# Patient Record
Sex: Female | Born: 1955 | Race: White | Hispanic: Yes | Marital: Single | Smoking: Never smoker
Health system: Southern US, Community
[De-identification: ages and names within clinical notes are randomized; demographics above are authoritative.]

## PROBLEM LIST (undated history)

## (undated) DIAGNOSIS — E785 Hyperlipidemia, unspecified: Secondary | ICD-10-CM

## (undated) DIAGNOSIS — F329 Major depressive disorder, single episode, unspecified: Secondary | ICD-10-CM

## (undated) DIAGNOSIS — D649 Anemia, unspecified: Secondary | ICD-10-CM

## (undated) DIAGNOSIS — D62 Acute posthemorrhagic anemia: Secondary | ICD-10-CM

## (undated) DIAGNOSIS — I10 Essential (primary) hypertension: Secondary | ICD-10-CM

## (undated) DIAGNOSIS — F32A Depression, unspecified: Secondary | ICD-10-CM

## (undated) DIAGNOSIS — M069 Rheumatoid arthritis, unspecified: Secondary | ICD-10-CM

## (undated) DIAGNOSIS — D509 Iron deficiency anemia, unspecified: Secondary | ICD-10-CM

## (undated) DIAGNOSIS — H269 Unspecified cataract: Secondary | ICD-10-CM

## (undated) DIAGNOSIS — R55 Syncope and collapse: Secondary | ICD-10-CM

## (undated) HISTORY — DX: Major depressive disorder, single episode, unspecified: F32.9

## (undated) HISTORY — DX: Depression, unspecified: F32.A

## (undated) HISTORY — PX: KNEE SURGERY: SHX244

## (undated) HISTORY — DX: Unspecified cataract: H26.9

## (undated) HISTORY — DX: Essential (primary) hypertension: I10

## (undated) HISTORY — DX: Anemia, unspecified: D64.9

## (undated) HISTORY — DX: Rheumatoid arthritis, unspecified: M06.9

## (undated) HISTORY — DX: Hyperlipidemia, unspecified: E78.5

---

## 2013-05-22 HISTORY — PX: VARICOSE VEIN SURGERY: SHX832

## 2014-02-19 LAB — HM MAMMOGRAPHY

## 2014-05-08 ENCOUNTER — Ambulatory Visit: Payer: Self-pay | Attending: Internal Medicine

## 2014-05-22 HISTORY — PX: FOOT SURGERY: SHX648

## 2014-06-12 ENCOUNTER — Ambulatory Visit: Payer: Self-pay

## 2014-06-19 ENCOUNTER — Ambulatory Visit: Payer: Self-pay | Attending: Internal Medicine

## 2014-07-13 ENCOUNTER — Ambulatory Visit: Payer: Self-pay | Admitting: Internal Medicine

## 2014-07-16 ENCOUNTER — Ambulatory Visit: Payer: Self-pay | Attending: Internal Medicine | Admitting: Internal Medicine

## 2014-07-16 ENCOUNTER — Encounter: Payer: Self-pay | Admitting: Internal Medicine

## 2014-07-16 VITALS — BP 118/79 | HR 52 | Temp 98.4°F | Resp 16 | Ht 60.0 in | Wt 170.0 lb

## 2014-07-16 DIAGNOSIS — H7291 Unspecified perforation of tympanic membrane, right ear: Secondary | ICD-10-CM

## 2014-07-16 DIAGNOSIS — M069 Rheumatoid arthritis, unspecified: Secondary | ICD-10-CM

## 2014-07-16 DIAGNOSIS — I10 Essential (primary) hypertension: Secondary | ICD-10-CM

## 2014-07-16 DIAGNOSIS — M270 Developmental disorders of jaws: Secondary | ICD-10-CM

## 2014-07-16 MED ORDER — BISOPROLOL-HYDROCHLOROTHIAZIDE 5-6.25 MG PO TABS: 1.0000 | ORAL_TABLET | Freq: Every day | ORAL | Status: DC

## 2014-07-16 NOTE — Patient Instructions (Addendum)

## 2014-07-16 NOTE — Progress Notes (Signed)
Pt is here to establish care. Pt has a history of arthritis.  Pt wants to have a check up and wants to check her cholesterol.

## 2014-07-16 NOTE — Progress Notes (Signed)
Patient ID: Rachel Austin, female   DOB: 1956/01/17, 59 y.o.   MRN: [id number]  [id number]  [id number]  DOB - 06-08-1955  CC:  Chief Complaint  Patient presents with  . Establish Care       HPI: Rachel Austin is a 59 y.o. Hispanic female here today to establish medical care. She has a past medical history of Rheumatoid Arthritis and hypertension. She states that she had a rheumatologist in [place] but has been unable to find a provider since moving here to [place].  Pap/mammogram---last October and both were normal.  Right perforated ear drum 20 years ago Hard palate swollen-cyst? Has not had it evaluated before.  Patient has No headache, No chest pain, No abdominal pain - No Nausea, No new weakness tingling or numbness, No Cough - SOB.  No Known Allergies Past Medical History  Diagnosis Date  . Arthritis    No current outpatient prescriptions on file prior to visit.   No current facility-administered medications on file prior to visit.   Family History  Problem Relation Age of Onset  . Heart disease Mother    History   Social History  . Marital Status: Single    Spouse Name: N/A  . Number of Children: N/A  . Years of Education: N/A   Occupational History  . Not on file.   Social History Main Topics  . Smoking status: Never Smoker   . Smokeless tobacco: Not on file  . Alcohol Use: No  . Drug Use: No  . Sexual Activity: No   Other Topics Concern  . Not on file   Social History Narrative  . No narrative on file    Review of Systems  Eyes: Negative.   Musculoskeletal: Positive for joint pain.  All other systems reviewed and are negative.     Objective:   Filed Vitals:   07/16/14 1407  BP: 118/79  Pulse: 52  Temp: 98.4 F (36.9 C)  Resp: 16    Physical Exam  Constitutional: She is oriented to person, place, and time.  HENT:  Left Ear: External ear normal.  Mouth/Throat: No oropharyngeal exudate.    Right perforated eardrum    Cardiovascular: Normal rate, regular rhythm and normal heart sounds.   Pulmonary/Chest: Effort normal and breath sounds normal.  Musculoskeletal: She exhibits no edema or tenderness.  Neurological: She is alert and oriented to person, place, and time.  Skin: Skin is warm and dry.     No results found for: WBC, HGB, HCT, MCV, PLT No results found for: CREATININE, BUN, NA, K, CL, CO2  No results found for: HGBA1C Lipid Panel  No results found for: CHOL, TRIG, HDL, CHOLHDL, VLDL, LDLCALC     Assessment and plan:   [name] was seen today for establish care.  Diagnoses and all orders for this visit:  Rheumatoid arthritis Orders: -     Ambulatory referral to Rheumatology--to continue with adalimumab -     Lipid panel; Future -     COMPLETE METABOLIC PANEL WITH GFR; Future -     CBC with Differential; Future -     TSH; Future -     HIV antibody (with reflex); Future  Essential hypertension Orders: -    refill bisoprolol-hydrochlorothiazide (ZIAC) 5-6.25 MG per tablet; Take 1 tablet by mouth daily.  Perforated ear drum, right Proper precautions discussed. 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 .   Due to language barrier, an interpreter was present during the history-taking and subsequent discussion (and for part of the physical exam) with this patient.   Chari Manning, NP-C [facility] [phone] 07/16/2014, 2:33 PM

## 2014-07-23 ENCOUNTER — Ambulatory Visit: Payer: Self-pay | Attending: Internal Medicine

## 2014-07-23 DIAGNOSIS — M069 Rheumatoid arthritis, unspecified: Secondary | ICD-10-CM

## 2014-07-23 LAB — LIPID PANEL
CHOLESTEROL: 205 mg/dL — AB (ref 0–200)
HDL: 55 mg/dL (ref >=46)
LDL Cholesterol: 124 mg/dL — ABNORMAL HIGH (ref 0–99)
TRIGLYCERIDES: 130 mg/dL (ref <150)
Total CHOL/HDL Ratio: 3.7 Ratio
VLDL: 26 mg/dL (ref 0–40)

## 2014-07-28 ENCOUNTER — Telehealth: Payer: Self-pay | Admitting: *Deleted

## 2014-07-28 NOTE — Telephone Encounter (Signed)
-----   Message from Lance Bosch, NP sent at 07/24/2014 10:15 AM EST ----- Cholesterol is elavated. It may help to add patient on low dose statin. Please send atorvastatin 10 mg PO every evening. Go over diet and exercise

## 2014-07-28 NOTE — Telephone Encounter (Signed)
Left message to return my call.  

## 2014-08-10 ENCOUNTER — Ambulatory Visit: Payer: Self-pay | Attending: Internal Medicine

## 2014-08-28 ENCOUNTER — Telehealth: Payer: Self-pay

## 2014-08-28 NOTE — Telephone Encounter (Signed)
Received call from patient indicating she received a letter from Rachel Austin regarding her ENT and Rheumatology referrals. Patient requests call back from Rachel Austin to discuss referrals.  Message routed to [employer].

## 2014-09-10 NOTE — Telephone Encounter (Signed)
Mailed a letter to patient on 08-19-14   Pt has the Chiropractor program and they don't have ENT in the network Mailed a letter to patient with the option to go to [facility] $100 or [facility] $ 65.00 and apply for financial with them Waiting for patient response.  Rheumatology  Pt don't have insurance I send her a letter with the options to go to [facility] Rheumatology  $65.00 and apply for financial assistant or go to [address] ph. # [phone] and pay $ 110.  Waiting for patient answer.

## 2014-11-11 ENCOUNTER — Encounter: Payer: Self-pay | Admitting: *Deleted

## 2014-12-30 ENCOUNTER — Encounter: Payer: Self-pay | Admitting: Adult Health

## 2014-12-30 ENCOUNTER — Ambulatory Visit (INDEPENDENT_AMBULATORY_CARE_PROVIDER_SITE_OTHER): Payer: Medicare Other | Admitting: Adult Health

## 2014-12-30 VITALS — BP 110/72 | Temp 99.7°F | Ht 60.0 in | Wt 158.9 lb

## 2014-12-30 DIAGNOSIS — Z7189 Other specified counseling: Secondary | ICD-10-CM

## 2014-12-30 DIAGNOSIS — Z7689 Persons encountering health services in other specified circumstances: Secondary | ICD-10-CM

## 2014-12-30 DIAGNOSIS — I10 Essential (primary) hypertension: Secondary | ICD-10-CM

## 2014-12-30 DIAGNOSIS — H65191 Other acute nonsuppurative otitis media, right ear: Secondary | ICD-10-CM

## 2014-12-30 DIAGNOSIS — F329 Major depressive disorder, single episode, unspecified: Secondary | ICD-10-CM

## 2014-12-30 DIAGNOSIS — Z23 Encounter for immunization: Secondary | ICD-10-CM | POA: Diagnosis not present

## 2014-12-30 DIAGNOSIS — M069 Rheumatoid arthritis, unspecified: Secondary | ICD-10-CM

## 2014-12-30 DIAGNOSIS — F32A Depression, unspecified: Secondary | ICD-10-CM

## 2014-12-30 MED ORDER — BISOPROLOL-HYDROCHLOROTHIAZIDE 5-6.25 MG PO TABS: 1.0000 | ORAL_TABLET | Freq: Every day | ORAL | Status: DC

## 2014-12-30 MED ORDER — CIPROFLOXACIN-DEXAMETHASONE 0.3-0.1 % OT SUSP: OTIC | Status: DC

## 2014-12-30 MED ORDER — CITALOPRAM HYDROBROMIDE 20 MG PO TABS: 20.0000 mg | ORAL_TABLET | Freq: Every day | ORAL | Status: DC

## 2014-12-30 NOTE — Progress Notes (Signed)
HPI:  Rachel Austin is here to establish care. She is a very pleasant spanish speaking female. She presents with spanish interpreter. She   has a past medical history of Rheumatoid arthritis; Hyperlipidemia; Hypertension; and Depression.  Last PCP and physical:October 2015.  Immunizations:Needs Tdap Diet:Vegetables, chicken, fruit.  Exercise:No Colonoscopy:2008 Pap Smear:October 2015, normal Mammogram:02/2014  Has the following chronic problems that require follow up and concerns today:  Right Ear Pain Complains of pain in her right ear. She endorses that she has had pain in the right ear for a week. She endorses drainage. No tinnitus. She has chronic ear infection.   RA - Needs to establish care with Rheumatology in [place].   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 Since that time she has suffered from periodic episodes of depression. She is currently depressed due to moving from [place] to be with her daughter, her daughter works and she does not know anyone here and does not know the area. She denies any SI or HI. Is able to spend time with new granddaughter which helps make her happy.    ROS negative for unless reported above: fevers, chills,feeling poorly, unintentional weight loss, hearing or vision loss, chest pain, palpitations, leg claudication, struggling to breath,Not feeling congested in the chest, no orthopenia, no cough,no wheezing, normal appetite, no soft tissue swelling, no hemoptysis, melena, hematochezia, hematuria, falls, loc, si, or thoughts of self harm.    Past Medical History  Diagnosis Date  . Arthritis     Past Surgical History  Procedure Laterality Date  . Knee surgery Bilateral     Family History  Problem Relation Age of Onset  . Heart disease Mother     Social History   Social History  . Marital Status: Single    Spouse Name:  N/A  . Number of Children: N/A  . Years of Education: N/A   Social History Main Topics  . Smoking status: Never Smoker   . Smokeless tobacco: None  . Alcohol Use: No  . Drug Use: No  . Sexual Activity: No   Other Topics Concern  . None   Social History Narrative     Current outpatient prescriptions:  .  Adalimumab 40 MG/0.8ML PSKT, Inject into the skin. Inject 1 dose subq every 2 weeks or as directed., Disp: , Rfl:  .  bisoprolol-hydrochlorothiazide (ZIAC) 5-6.25 MG per tablet, Take 1 tablet by mouth daily., Disp: 30 tablet, Rfl: 4 .  CALCIUM PO, Take by mouth., Disp: , Rfl:  .  methotrexate (RHEUMATREX) 2.5 MG tablet, Take 2.5 mg by mouth once a week. Tale 6 pills by mouth every week.   Caution:Chemotherapy. Protect from light., Disp: , Rfl:  .  Multiple Vitamins-Minerals (CENTRUM SILVER ULTRA WOMENS PO), Take by mouth., Disp: , Rfl:   EXAM:  Filed Vitals:   12/30/14 1422  BP: 110/72  Temp: 99.7 F (37.6 C)    Body mass index is 31.03 kg/(m^2).  GENERAL: vitals reviewed and listed above, alert, oriented, appears well hydrated and in no acute distress  HEENT: atraumatic, conjunttiva clear, no obvious abnormalities on inspection of external nose and ears. Otitis externa in right ear.   NECK: Neck is soft and supple without masses, no adenopathy or thyromegaly, trachea midline, no JVD. Normal range of motion.   LUNGS: clear to auscultation bilaterally, no wheezes, rales or rhonchi, good air movement  CV: Regular rate and rhythm, normal S1/S2, no audible murmurs, gallops, or rubs. No carotid bruit and no peripheral edema.   MS: moves all extremities without noticeable abnormality. Trace edema in right ankle. Has significant joint damage in bilateral hands.   Abd: soft/nontender/nondistended/normal bowel sounds   Skin: warm and dry, no rash   Extremities: No clubbing, cyanosis, or edema. Capillary refill is WNL. Pulses intact bilaterally in upper and lower  extremities.   Neuro: CN II-XII intact, sensation and reflexes normal throughout, 5/5 muscle strength in bilateral upper and lower extremities. Normal finger to nose. Normal rapid alternating movements.   PSYCH: pleasant and cooperative, no obvious depression or anxiety  ASSESSMENT AND PLAN:  1. Encounter to establish care - Follow up in October for CPE - Follow up sooner if needed - Work on low sodium diet  2. Essential hypertension - bisoprolol-hydrochlorothiazide (ZIAC) 5-6.25 MG per tablet; Take 1 tablet by mouth daily.  Dispense: 30 tablet; Refill: 4  3. Acute nonsuppurative otitis media of right ear - ciprofloxacin-dexamethasone (CIPRODEX) otic suspension; Apply 4 drops to the affected ear(s) twice daily x 7 days  Dispense: 7.5 mL; Refill: 0  4. Rheumatoid arthritis - Ambulatory referral to Rheumatology  5. Depression - citalopram (CELEXA) 20 MG tablet; Take 1 tablet (20 mg total) by mouth daily.  Dispense: 30 tablet; Refill: 3 - Will reassess during CPE - Instructed to go to the ER with any thoughts of SI or HI - Do think that she will do much better when she meets people. Encouraged to go to church to meet people.     -We reviewed the PMH, PSH, FH, SH, Meds and Allergies. -We provided refills for any medications we will prescribe as needed. -We addressed current concerns per orders and patient instructions. -We have asked for records for pertinent exams, studies, vaccines and notes from previous providers. -We have advised patient to follow up per instructions below.   -Patient advised to return or notify a provider immediately if symptoms worsen or persist or new concerns arise.  There are no Patient Instructions on file for this visit.   [employer]

## 2014-12-30 NOTE — Patient Instructions (Addendum)
It was great meeting you today!  Please take the medications as prescribed.   Follow up in October for your physical.   If you need anything in the meantime, please let me know. [place] (Health Maintenance) Adoptar un estilo de vida saludable y recibir atencin preventiva pueden ser de suma utilidad para promover la salud y Musician. Hable con el mdico para saber cul es el esquema de exmenes peridicos adecuado para usted. Esta es una buena oportunidad para Teacher, adult education peridicamente al mdico sobre cmo prevenir enfermedades y Iola sano. Entre cada control mdico, hay muchas cosas que puede hacer por s solo. Los expertos han investigado mucho acerca de los cambios en el estilo de vida y las medidas preventivas que muy probablemente preserven su salud. Consulte al mdico para obtener ms informacin. EL PESO Y LA DIETA  Consuma una dieta saludable.  Incluya abundante cantidad de verduras, frutas, productos lcteos descremados y protenas magras.  No coma muchos alimentos con alto contenido de grasas slidas, azcares agregados o sal.  Realice actividad fsica con regularidad. Esta es una de las cosas ms importantes que puede hacer por su salud.  La [employer] de las personas adultas deben hacer actividad fsica durante por lo menos 128minutos semanales. El ejercicio debe aumentar la frecuencia cardaca y Nature conservation officer sudar (ejercicio de intensidad moderada).  Adems, casi todos los adultos deben hacer ejercicios de fortalecimiento al [employer] veces por semana como complemento del ejercicio de [place]. Mantenga un peso saludable.  El ndice de masa corporal [facility]) es una medida que puede usarse para identificar posibles problemas relacionados con el peso. Este ofrece un clculo estimativo de la Air traffic controller en funcin del peso y Agricultural consultant. El mdico puede determinar su [facility] y ayudarlo a Science writer y Theatre manager un peso saludable.  Para las mujeres mayores de  20aos:  Un [facility] menor de 18,5 se considera bajo peso.  Un [facility] entre 18,5 y 24,9 es normal.  Un [facility] entre 25 y 29,9 es sobrepeso.  Un IMC de 30 o ms se considera obesidad. Controlar los niveles de colesterol y lpidos en la sangre  Debe comenzar a hacerse anlisis de sangre para controlar los Everetts de lpidos y colesterol a partir de [place], y repetir estos estudios cada 5aos.  Tal vez deba someterse a controles de los niveles de colesterol con ms frecuencia si:  Tiene los niveles de lpidos o colesterol elevados.  Es mayor de 50aos.  Tiene un riesgo alto de tener enfermedades cardacas. DETECCIN DE CNCER  Cncer de pulmn  Se recomienda realizar exmenes de deteccin de cncer de pulmn a las personas adultas que tienen entre 8 y 80aos, y corren riesgo de Best boy cncer de pulmn debido a sus antecedentes de tabaquismo.  Se recomienda realizar una tomografa computarizada anual de baja dosis de los pulmones a las personas que:  Siguen fumando.  Hayan dejado de fumar en los ltimos 15aos.  Hayan fumado un paquete diario durante 30aos. El ndice ao-paquete equivale a fumar, en promedio, un paquete de cigarrillos diario durante 1ao.  Debe seguir realizndose estudios de deteccin anual hasta que hayan pasado 15aos desde que dej de fumar.  Estos estudios deben suspenderse si tiene un problema de salud que le impedira recibir tratamiento para Science writer de pulmn. Cncer de mama  Ponga en prctica la "autoconciencia de las mamas". Esto significa reconocer la apariencia normal de las mamas y [place].  Adems implica realizarse autoexmenes peridicos de [employer]. Informe  al mdico si hay algn cambio, sin importar cun pequeo sea.  Si tiene entre 20 y 30aos, un mdico debe hacerle un examen clnico de las mamas cada 1 a 3aos como parte del examen habitual de salud.  Si es mayor de 40aos, debe Electrical engineer un examen clnico de las [employer]. Tambin debe considerar la posibilidad de realizarse una radiografa de las mamas ([place]) todos los [place].  Si tiene antecedentes familiares de cncer de mama, hable con el mdico para saber si debe someterse a un estudio gentico.  Si tiene un riesgo alto de Animal nutritionist de mama, hable con el mdico para saber si debe hacerse una resonancia magntica y [employer].  Se recomienda una evaluacin del gen del cncer de mama (BRCA) a las mujeres que tengan familiares con tumores malignos relacionados con el BRCA. Los tumores malignos relacionados con elBRCA incluyen:  [employer].  Los [employer].  Los tumores malignos del peritoneo.  Los resultados de la evaluacin determinarn la necesidad de asesoramiento gentico y de [place] de BRCA1 y BRCA2. Cncer de cuello uterino Ya no se recomiendan los exmenes plvicos de rutina para la deteccin del cncer de cuello uterino en las mujeres que no estn embarazadas que son consideradas sujetos de bajo riesgo de Best boy cncer de los rganos de la pelvis (ovarios, tero y vagina) y que no tienen sntomas. Tal vez sea necesario realizar un examen plvico si tiene sntomas, incluidos aquellos que estn asociados con infecciones en la pelvis. Pregntele al mdico si un examen plvico de deteccin es adecuado para usted.   El Papanicolau es la prueba de deteccin del cncer de cuello uterino para las mujeres que podran Engineer, production.  Si le han realizado una histerectoma por un problema que no era cncer u otra enfermedad que podra causar cncer, ya no necesitar realizarse pruebas de Papanicolaou.  Si es mayor de 68aos y los resultados de las pruebas de Papanicolaou han sido normales durante los ltimos 10aos, ya no es necesario que se realice estos estudios.  Si ha recibido un tratamiento para el cncer de cuello uterino o para una enfermedad que podra causar cncer, necesitar realizarse una prueba de  Papanicolaou y controles durante al menos 41 aos de concluido el [place].  Si ya no se realiza pruebas de Papanicolaou, debe evaluar sus factores de riesgo si estos se modifican (por ejemplo, tiene un nuevo compaero sexual). Esta situacin puede influir en la necesidad de que se someta nuevamente a estudios de deteccin.  Algunas mujeres sufren problemas mdicos que aumentan la probabilidad de tener cncer de cuello uterino. En este caso, el mdico podr indicarle que se someta a exmenes de deteccin y pruebas de Papanicolaou con ms frecuencia.  La prueba del virus del Engineer, technical sales (VPH) es un estudio adicional que puede usarse para la deteccin del cncer de cuello uterino. Esta prueba busca la presencia del virus que puede causar cambios celulares en el cuello del tero. Las clulas que se recolectan durante la prueba de Papanicolaou pueden usarse para el VPH.  La prueba del VPH puede usarse para examinar a las [employer] de [employer]. Someterse a International aid/development worker del VPH puede prolongar el [employer] las pruebas de Papanicolaou normales de tres a Product manager.  Adems, se debe realizar la prueba del VPH para evaluar a las mujeres de cualquier edad cuyos resultados del Papanicolau no sean claros.  Despus de los 30aos, las mujeres deben Dispensing optician  pruebas del VPH con la misma frecuencia que las pruebas de Papanicolau. Cncer colorrectal  Es posible detectar este tipo de cncer y, a menudo, es posible prevenirlo.  Generalmente, los estudios de deteccin de rutina del cncer colorrectal empiezan a hacerse a Proofreader de los 59aos y [employer] [phone].  El mdico puede recomendar que se los haga antes, si tiene factores de riesgo de cncer de colon.  Adems, el mdico puede recomendar que use un kit de prueba casera para hallar sangre oculta en la materia fecal.  Es posible que se use una pequea cmara en el extremo de un tubo para examinar directamente el colon  (sigmoidoscopa o colonoscopa), con el fin de Hydrographic surveyor las formas ms incipientes de Surveyor, minerals.  Generalmente, los estudios de deteccin de rutina se Insurance underwriter a Proofreader de [place].  El examen directo del colon debe repetirse cada 5 a 10aos hasta cumplir 75aos. Sin embargo, tal vez deba someterse a la prueba de deteccin con ms frecuencia si se encuentran formas incipientes de plipos precancerosos o pequeos tumores. Cncer de piel  Revsese la piel peridicamente desde los dedos de los pies hasta la cabeza.  Informe al mdico si aparecen nuevos lunares o si nota cambios en los que ya tiene, especialmente en la forma o el color.  Tambin notifquele si tiene un lunar cuyo tamao es ms grande que el de la goma de un lpiz.  Use siempre pantalla solar. Aplique pantalla solar tantas veces como pueda a lo largo del da.  Protjase usando mangas y pantalones largos, un sombrero de ala ancha y gafas para el sol todo [place], siempre que est al [place]. ENFERMEDADES CARDACAS, DIABETES E HIPERTENSIN ARTERIAL   Debe controlarse la presin arterial al menos cada 1 o 2aos. La hipertensin arterial causa enfermedades cardacas y [place] el riesgo de ictus.  Si tiene entre 66 y 79aos, consulte al mdico si debe tomar aspirina para prevenir ictus.  Hgase anlisis peridicos para la diabetes, que incluyen la toma de [place] de sangre para Freight forwarder nivel de azcar en la sangre mientras est en ayunas.  Si su peso es normal y tiene un riesgo bajo de tener diabetes, hgase este anlisis una vez cada tres aos, despus de los 45aos.  Si tiene sobrepeso y un riesgo alto de sufrir diabetes, considere la posibilidad de [employer] a una edad ms temprana o con ms frecuencia. PREVENCIN DE INFECCIONES  Hepatitis B  Si tiene un riesgo ms alto de tener hepatitisB, debe hacerse anlisis de deteccin de [place] virus. Se considera que tiene un alto riesgo de  hepatitis B si:  Naci en un pas donde la hepatitisB es frecuente. Pregntele al mdico qu pases son considerados de Public affairs consultant.  Sus padres nacieron en un pas de alto riesgo, y usted no recibi la vacuna contra la hepatitis B.  [place].  [place] agujas para inyectarse drogas.  Convive con una persona que tiene hepatitisB.  Tuvo relaciones sexuales con una persona que tiene hepatitisB.  Recibe tratamiento de hemodilisis. Toma ciertos medicamentos para cuadros clnicos tales como cncer, trasplante de

## 2014-12-31 ENCOUNTER — Telehealth: Payer: Self-pay | Admitting: Adult Health

## 2014-12-31 ENCOUNTER — Other Ambulatory Visit: Payer: Self-pay | Admitting: Adult Health

## 2014-12-31 MED ORDER — OFLOXACIN 0.3 % OT SOLN: 5.0000 [drp] | Freq: Every day | OTIC | Status: DC

## 2014-12-31 NOTE — Telephone Encounter (Signed)
Pt can not afford ear drop med ciprodex. [facility] [address]

## 2014-12-31 NOTE — Telephone Encounter (Signed)
Called to make pt aware that there was a coupon online.  Left a message for return call.  Any suggestions?

## 2014-12-31 NOTE — Telephone Encounter (Signed)
Sent in prescription for Ofloxacin. It is $9 with goodrx.com coupon

## 2014-12-31 NOTE — Addendum Note (Signed)
Addended by: Vergia Alberts C on: 12/31/2014 03:00 PM   Modules accepted: Orders

## 2015-01-01 MED ORDER — OFLOXACIN 0.3 % OT SOLN: 5.0000 [drp] | Freq: Every day | OTIC | Status: DC

## 2015-01-01 NOTE — Telephone Encounter (Signed)
Called and spoke with pts daughter on DPR and she is aware that new rx was sent to pharmacy and this one should be cheaper.

## 2015-01-04 ENCOUNTER — Telehealth: Payer: Self-pay | Admitting: Adult Health

## 2015-01-04 MED ORDER — OFLOXACIN 0.3 % OT SOLN: 5.0000 [drp] | Freq: Every day | OTIC | Status: DC

## 2015-01-04 NOTE — Telephone Encounter (Signed)
E scribing problem.  Rx resent to pharmacy.

## 2015-01-04 NOTE — Telephone Encounter (Signed)
Rx sent escribe on Friday and it seems the system was down.  Resent rx to pharmacy today.

## 2015-01-04 NOTE — Telephone Encounter (Signed)
Pt request refill of the following: ofloxacin (FLOXIN) 0.3 % otic solution   Phamacy: [facility]

## 2015-01-04 NOTE — Addendum Note (Signed)
Addended by: Colleen Can on: 01/04/2015 01:08 PM   Modules accepted: Orders, Medications

## 2015-02-11 DIAGNOSIS — R5383 Other fatigue: Secondary | ICD-10-CM | POA: Diagnosis not present

## 2015-02-11 DIAGNOSIS — Z79899 Other long term (current) drug therapy: Secondary | ICD-10-CM | POA: Diagnosis not present

## 2015-02-11 DIAGNOSIS — M19041 Primary osteoarthritis, right hand: Secondary | ICD-10-CM | POA: Diagnosis not present

## 2015-02-11 DIAGNOSIS — M19042 Primary osteoarthritis, left hand: Secondary | ICD-10-CM | POA: Diagnosis not present

## 2015-02-11 DIAGNOSIS — M05842 Other rheumatoid arthritis with rheumatoid factor of left hand: Secondary | ICD-10-CM | POA: Diagnosis not present

## 2015-02-11 DIAGNOSIS — M0600 Rheumatoid arthritis without rheumatoid factor, unspecified site: Secondary | ICD-10-CM | POA: Diagnosis not present

## 2015-02-11 DIAGNOSIS — M05841 Other rheumatoid arthritis with rheumatoid factor of right hand: Secondary | ICD-10-CM | POA: Diagnosis not present

## 2015-02-12 DIAGNOSIS — M0600 Rheumatoid arthritis without rheumatoid factor, unspecified site: Secondary | ICD-10-CM | POA: Diagnosis not present

## 2015-02-18 DIAGNOSIS — M0580 Other rheumatoid arthritis with rheumatoid factor of unspecified site: Secondary | ICD-10-CM | POA: Diagnosis not present

## 2015-02-18 DIAGNOSIS — Z79899 Other long term (current) drug therapy: Secondary | ICD-10-CM | POA: Diagnosis not present

## 2015-03-01 ENCOUNTER — Ambulatory Visit (INDEPENDENT_AMBULATORY_CARE_PROVIDER_SITE_OTHER): Payer: Medicare Other | Admitting: Adult Health

## 2015-03-01 ENCOUNTER — Encounter: Payer: Self-pay | Admitting: Adult Health

## 2015-03-01 VITALS — BP 110/80 | Temp 98.6°F | Ht 60.0 in | Wt 154.1 lb

## 2015-03-01 DIAGNOSIS — F329 Major depressive disorder, single episode, unspecified: Secondary | ICD-10-CM

## 2015-03-01 DIAGNOSIS — Z711 Person with feared health complaint in whom no diagnosis is made: Secondary | ICD-10-CM

## 2015-03-01 DIAGNOSIS — E785 Hyperlipidemia, unspecified: Secondary | ICD-10-CM | POA: Diagnosis not present

## 2015-03-01 DIAGNOSIS — Z1239 Encounter for other screening for malignant neoplasm of breast: Secondary | ICD-10-CM

## 2015-03-01 DIAGNOSIS — Z0000 Encounter for general adult medical examination without abnormal findings: Secondary | ICD-10-CM | POA: Diagnosis not present

## 2015-03-01 DIAGNOSIS — I10 Essential (primary) hypertension: Secondary | ICD-10-CM | POA: Diagnosis not present

## 2015-03-01 DIAGNOSIS — F32A Depression, unspecified: Secondary | ICD-10-CM

## 2015-03-01 DIAGNOSIS — Z23 Encounter for immunization: Secondary | ICD-10-CM | POA: Diagnosis not present

## 2015-03-01 LAB — LIPID PANEL
CHOL/HDL RATIO: 4
Cholesterol: 187 mg/dL (ref 0–200)
HDL: 47.1 mg/dL (ref >39.00)
LDL Cholesterol: 119 mg/dL — ABNORMAL HIGH (ref 0–99)
NONHDL: 140.08
Triglycerides: 106 mg/dL (ref 0.0–149.0)
VLDL: 21.2 mg/dL (ref 0.0–40.0)

## 2015-03-01 LAB — HEPATIC FUNCTION PANEL
ALBUMIN: 3.7 g/dL (ref 3.5–5.2)
ALK PHOS: 72 U/L (ref 39–117)
ALT: 13 U/L (ref 0–35)
AST: 18 U/L (ref 0–37)
BILIRUBIN DIRECT: 0.1 mg/dL (ref 0.0–0.3)
Total Bilirubin: 0.4 mg/dL (ref 0.2–1.2)
Total Protein: 7.9 g/dL (ref 6.0–8.3)

## 2015-03-01 LAB — BASIC METABOLIC PANEL
BUN: 15 mg/dL (ref 6–23)
CALCIUM: 9.7 mg/dL (ref 8.4–10.5)
CO2: 32 mEq/L (ref 19–32)
Chloride: 102 mEq/L (ref 96–112)
Creatinine, Ser: 0.5 mg/dL (ref 0.40–1.20)
GFR: 134.47 mL/min (ref >60.00)
Glucose, Bld: 86 mg/dL (ref 70–99)
POTASSIUM: 4.5 meq/L (ref 3.5–5.1)
SODIUM: 141 meq/L (ref 135–145)

## 2015-03-01 LAB — CBC WITH DIFFERENTIAL/PLATELET
BASOS ABS: 0 10*3/uL (ref 0.0–0.1)
Basophils Relative: 0.5 % (ref 0.0–3.0)
EOS PCT: 2.5 % (ref 0.0–5.0)
Eosinophils Absolute: 0.2 10*3/uL (ref 0.0–0.7)
HCT: 36.3 % (ref 36.0–46.0)
HEMOGLOBIN: 11.6 g/dL — AB (ref 12.0–15.0)
Lymphocytes Relative: 46.1 % — ABNORMAL HIGH (ref 12.0–46.0)
Lymphs Abs: 3.3 10*3/uL (ref 0.7–4.0)
MCHC: 31.9 g/dL (ref 30.0–36.0)
MCV: 86.4 fl (ref 78.0–100.0)
MONO ABS: 0.5 10*3/uL (ref 0.1–1.0)
MONOS PCT: 6.7 % (ref 3.0–12.0)
NEUTROS PCT: 44.2 % (ref 43.0–77.0)
Neutro Abs: 3.2 10*3/uL (ref 1.4–7.7)
Platelets: 360 10*3/uL (ref 150.0–400.0)
RBC: 4.2 Mil/uL (ref 3.87–5.11)
RDW: 14.1 % (ref 11.5–15.5)
WBC: 7.2 10*3/uL (ref 4.0–10.5)

## 2015-03-01 LAB — POCT URINALYSIS DIPSTICK
BILIRUBIN UA: NEGATIVE
Glucose, UA: NEGATIVE
KETONES UA: NEGATIVE
Nitrite, UA: NEGATIVE
PH UA: 6
SPEC GRAV UA: 1.02
Urobilinogen, UA: 1

## 2015-03-01 LAB — TSH: TSH: 2.24 u[IU]/mL (ref 0.35–4.50)

## 2015-03-01 LAB — HEMOGLOBIN A1C: HEMOGLOBIN A1C: 5.9 % (ref 4.6–6.5)

## 2015-03-01 MED ORDER — CITALOPRAM HYDROBROMIDE 20 MG PO TABS: 20.0000 mg | ORAL_TABLET | Freq: Every day | ORAL | Status: DC

## 2015-03-01 MED ORDER — BISOPROLOL-HYDROCHLOROTHIAZIDE 5-6.25 MG PO TABS: 1.0000 | ORAL_TABLET | Freq: Every day | ORAL | Status: DC

## 2015-03-01 NOTE — Addendum Note (Signed)
Addended by: Apolinar Junes on: 03/01/2015 09:29 AM   Modules accepted: Miquel Dunn

## 2015-03-01 NOTE — Progress Notes (Addendum)
Subjective:    Patient ID: Rachel Austin, female    DOB: [birth year], 59 y.o.   MRN: [id number]  HPI Patient presents for yearly preventative medicine examination. Medicare questionnaire was completed  All immunizations and health maintenance protocols were reviewed with the patient and needed orders were placed.  Appropriate screening laboratory values were ordered for the patient including screening of hyperlipidemia, renal function and hepatic function.   Medication reconciliation,  past medical history, social history, problem list and allergies were reviewed in detail with the patient  Goals were established with regard to weight loss, exercise, and  diet in compliance with medications.  Wt Readings from Last 3 Encounters:  03/01/15 154 lb 1.6 oz (69.899 kg)  12/30/14 158 lb 14.4 oz (72.077 kg)  07/16/14 170 lb (77.111 kg)    Depression  - She feels like her depression has been getting better. She endorses feeling better and " more calm" since starting this medication. She is going to [place] in November for three months to see her family. She is going to her church in order to meet people.   Hypertension - Is controlled with current medication.   She has since established care with Rheumatology and has seen them twice since her last visit with me. They have started her on Prednisone, she is taking her dose every other day due to the feeling of retaining water.     Review of Systems  Constitutional: Negative.   HENT: Negative.   Eyes: Negative.   Respiratory: Negative.   Cardiovascular: Negative.   Gastrointestinal: Negative.   Endocrine: Negative.   Genitourinary: Negative.   Musculoskeletal: Negative.   Skin: Negative.   Allergic/Immunologic: Negative.   Neurological: Negative.   Hematological: Negative.   Psychiatric/Behavioral: Negative.   All other systems reviewed and are negative.  Past Medical History  Diagnosis Date  . Rheumatoid arthritis ([place])     . Hyperlipidemia   . Hypertension   . Depression     Social History   Social History  . Marital Status: Single    Spouse Name: N/A  . Number of Children: N/A  . Years of Education: N/A   Occupational History  . Not on file.   Social History Main Topics  . Smoking status: Never Smoker   . Smokeless tobacco: Not on file  . Alcohol Use: No  . Drug Use: No  . Sexual Activity: No   Other Topics Concern  . Not on file   Social History Narrative   Moved from [place] to [facility] in December    Past Surgical History  Procedure Laterality Date  . Knee surgery Bilateral     Family History  Problem Relation Age of Onset  . Heart disease Mother     No Known Allergies  Current Outpatient Prescriptions on File Prior to Visit  Medication Sig Dispense Refill  . Adalimumab 40 MG/0.8ML PSKT Inject into the skin. Inject 1 dose subq every 2 weeks or as directed.    Marland Kitchen CALCIUM PO Take by mouth.    . methotrexate (RHEUMATREX) 2.5 MG tablet Take 2.5 mg by mouth once a week. Tale 6 pills by mouth every week.   Caution:Chemotherapy. Protect from light.    . Multiple Vitamins-Minerals (CENTRUM SILVER ULTRA WOMENS PO) Take by mouth.     No current facility-administered medications on file prior to visit.    BP 110/80 mmHg  Temp(Src) 98.6 F (37 C) (Oral)  Ht 5' (1.524 m)  Wt 154 lb 1.6 oz (  69.899 kg)  BMI 30.10 kg/m2       Objective:   Physical Exam  Constitutional: She is oriented to person, place, and time. She appears well-developed and well-nourished. No distress.  HENT:  Head: Normocephalic and atraumatic.  Right Ear: External ear normal.  Left Ear: External ear normal.  Nose: Nose normal.  Mouth/Throat: Oropharynx is clear and moist. No oropharyngeal exudate.  Perforated ear drum in right ear.   No signs of infection.   Eyes: Conjunctivae and EOM are normal. Pupils are equal, round, and reactive to light. Right eye exhibits no discharge. Left eye exhibits no discharge. No  scleral icterus.  Neck: Normal range of motion. Neck supple. No JVD present. No tracheal deviation present. No thyromegaly present.  Cardiovascular: Normal rate, regular rhythm, normal heart sounds and intact distal pulses.  Exam reveals no gallop and no friction rub.   No murmur heard. Pulmonary/Chest: Effort normal and breath sounds normal. No respiratory distress. She has no wheezes. She has no rales. She exhibits no tenderness.  Abdominal: Soft. Bowel sounds are normal. She exhibits no distension and no mass. There is no tenderness. There is no rebound and no guarding.  Genitourinary:  Breast Exam: Refused by patient.  GYN: Not due   Musculoskeletal: Normal range of motion. She exhibits no edema or tenderness.  significant joint damage in right hand from RA  Lymphadenopathy:    She has no cervical adenopathy.  Neurological: She is alert and oriented to person, place, and time. She has normal reflexes.  Skin: Skin is warm and dry. No rash noted. She is not diaphoretic. No erythema. No pallor.  Psychiatric: She has a normal mood and affect. Her behavior is normal. Judgment and thought content normal.  Nursing note and vitals reviewed.      Assessment & Plan:   1. Medicare annual wellness visit, subsequent - Follow up in one year for next exam - Follow up sooner if needed.  - Work on diet and exercise  2. Concern about diabetes mellitus without diagnosis - Basic metabolic panel - Hemoglobin A1c - Hepatic function panel - POCT urinalysis dipstick - TSH  3. Hyperlipidemia - Lipid panel  4. Essential hypertension - CBC with Differential/Platelet - Hemoglobin A1c - Hepatic function panel - Lipid panel - POCT urinalysis dipstick - TSH - bisoprolol-hydrochlorothiazide (ZIAC) 5-6.25 MG tablet; Take 1 tablet by mouth daily.  Dispense: 90 tablet; Refill: 3  5. Depression - citalopram (CELEXA) 20 MG tablet; Take 1 tablet (20 mg total) by mouth daily.  Dispense: 90 tablet;  Refill: 3  6. Breast cancer screening - MM Digital Screening; Future

## 2015-03-01 NOTE — Patient Instructions (Addendum)
It was great seeing you again!   I will follow up with you regarding your blood work.   Your Mammogram appointment is for   Enjoy your trip home.   If you need anything, please let me know.    Rachel Austin (Health Maintenance, Female) Un estilo de vida saludable y los cuidados preventivos pueden favorecer considerablemente a la salud y Musician. Pregunte a su mdico cul es el cronograma de exmenes peridicos apropiado para usted. Esta es una buena oportunidad para consultarlo sobre cmo prevenir enfermedades y Rachel Austin. Adems de los controles, hay muchas otras cosas que puede hacer usted mismo. Los expertos han realizado numerosas investigaciones Rachel Austin cambios en el estilo de vida y las medidas de prevencin que, Rachel Austin, lo ayudarn a mantenerse Austin. Solicite a su mdico ms informacin. EL PESO Y LA DIETA  Consuma una dieta saludable.  Asegrese de [employer] verduras, frutas, productos lcteos de bajo contenido de Rachel Austin y Advertising account planner.  No consuma muchos alimentos de alto contenido de grasas slidas, azcares agregados o sal.  Realice actividad fsica con regularidad. Esta es una de las prcticas ms importantes que puede hacer por su salud.  La mayora de los adultos deben hacer ejercicio durante al menos 164mnutos por semana. El ejercicio debe aumentar la frecuencia cardaca y pActorla transpiracin (ejercicio de iCacao.  La mayora de los adultos tambin deben hField seismologistejercicios de elongacin al mToysRusveces a la semana. Agregue esto al su plan de ejercicio de intensidad moderada. Mantenga un peso saludable.  El ndice de masa corporal (Rachel Austin es una medida que puede utilizarse para identificar posibles problemas de pSchwana Proporciona una estimacin de la grasa corporal basndose en el peso y la altura. Su mdico puede ayudarle a dRadiation protection practitionerIBridgevilley a lScientist, forensico mTheatre managerun peso saludable.  Para las  mujeres de 20aos o ms:  Un IOcean Beach Hospitalmenor de 18,5 se considera bajo peso.  Un ITampa Va Medical Centerentre 18,5 y 24,9 es normal.  Un IArizona Digestive Centerentre 25 y 29,9 se considera sobrepeso.  Un IMC de 30 o ms se considera obesidad. Observe los niveles de colesterol y lpidos en la sangre.  Debe comenzar a rEnglish as a second language teacherde lpidos y cResearch officer, trade unionen la sangre a los 20aos y luego repetirlos cada 541aos  Es posible que nAutomotive engineerlos niveles de colesterol con mayor frecuencia si:  Sus niveles de lpidos y colesterol son altos.  Es mayor de 50aos.  Presenta un alto riesgo de padecer enfermedades cardacas. DETECCIN DE CNCER  Cncer de pulmn  Se recomienda realizar exmenes de deteccin de cncer de pulmn a personas adultas entre 537y 838aos que estn en riesgo de dHorticulturist, commercialde pulmn por sus antecedentes de consumo de tabaco.  Se recomienda una tomografa computarizada de baja dosis de los pLiberty Mediaaos a las personas que:  Fuman actualmente.  Hayan dejado el hbito en algn momento en los ltimos 15aos.  Hayan fumado durante 30aos un paquete diario. Un paquete-ao equivale a fumar un promedio de un paquete de cigarrillos diario durante un ao.  Los exmenes de deteccin anuales deben continuar hasta que hayan pasado 15aos desde que dej de fumar.  Ya no debern realizarse si tiene un problema de salud que le impida recibir tratamiento para eScience writerde pulmn. Cncer de mama  Practique la autoconciencia de la mama. Esto significa reconocer la apariencia normal de sus mamas y cmo las siente.  Tambin significa  realizar autoexmenes regulares de las [place]. Informe a su mdico sobre cualquier cambio, sin importar cun pequeo sea.  Si tiene entre Austin y 93 aos, un mdico debe realizarle un examen clnico de las mamas como parte del examen regular de Rachel Austin, cada 1 a 3aos.  Si tiene 40aos o ms, debe Information systems manager clnico de las [employer].  Tambin considere realizarse una [place] todos los [place].  Si tiene antecedentes familiares de cncer de mama, hable con su mdico para someterse a un estudio gentico.  Si tiene alto riesgo de Chief Financial Officer de mama, hable con su mdico para someterse a Public house manager y [employer].  La evaluacin del gen del cncer de mama (BRCA) se recomienda a mujeres que tengan familiares con cnceres relacionados con el BRCA. Los cnceres relacionados con el BRCA incluyen los siguientes:  Mama.  Ovario.  Trompas.  Cnceres de peritoneo.  Los resultados de la evaluacin determinarn la necesidad de asesoramiento gentico y de [place] de BRCA1 y BRCA2. Cncer de cuello del tero El mdico puede recomendarle que se haga pruebas peridicas de deteccin de cncer de los rganos de la pelvis (ovarios, tero y vagina). Estas pruebas incluyen un examen plvico, que abarca controlar si se produjeron cambios microscpicos en la superficie del cuello del tero (prueba de Papanicolaou). Pueden recomendarle que se haga estas pruebas cada 3aos, a partir de los 21aos.  A las mujeres que tienen entre 30 y 67aos, los mdicos pueden recomendarles que se sometan a exmenes plvicos y pruebas de Papanicolaou cada 78aos, o a la prueba de Papanicolaou y el examen plvico en combinacin con estudios de deteccin del virus del papiloma humano (VPH) cada 5aos. Algunos tipos de VPH aumentan el riesgo de Chief Financial Officer de cuello del tero. La prueba para la deteccin del VPH tambin puede realizarse a mujeres de cualquier edad cuyos resultados de la prueba de Papanicolaou no sean claros.  Es posible que otros mdicos no recomienden exmenes de deteccin a mujeres no embarazadas que se consideran sujetos de bajo riesgo de Chief Financial Officer de pelvis y que no tienen sntomas. Pregntele al mdico si un examen plvico de deteccin es adecuado para usted.  Si ha recibido  un tratamiento para Science writer cervical o una enfermedad que podra causar cncer, necesitar realizarse una prueba de Papanicolaou y controles durante al menos 14 aos de concluido el [place]. Si no se ha hecho el Papanicolaou con regularidad, debern volver a evaluarse los factores de riesgo (como tener un nuevo compaero sexual), para Teacher, adult education si debe realizarse los estudios nuevamente. Algunas mujeres sufren problemas mdicos que aumentan la probabilidad de Museum/gallery curator cncer de cuello del tero. En estos casos, el mdico podr QUAL[employer] se realicen controles y pruebas de Papanicolaou con ms frecuencia. Cncer colorrectal  Este tipo de cncer puede detectarse y a menudo prevenirse.  Por lo general, los estudios de rutina se deben Medical laboratory scientific officer a Field seismologist a Proofreader de los 22 aos y Rachel Austin aos.  Sin embargo, el mdico podr aconsejarle que lo haga antes, si tiene factores de riesgo para el cncer de colon.  Tambin puede recomendarle que use un kit de prueba para Hydrologist en la materia fecal.  Es posible que se use una pequea cmara en el extremo de un tubo para examinar directamente el colon (sigmoidoscopia o colonoscopia) a fin de Hydrographic surveyor formas tempranas de cncer colorrectal.  Los exmenes de rutina generalmente comienzan a los  50aos.  El examen directo del colon se debe repetir cada 5 a 10aos hasta los 75aos. Sin embargo, es posible que se realicen exmenes con mayor frecuencia, si se detectan formas tempranas de plipos precancerosos o pequeos bultos. Cncer de piel  Revise la piel de la cabeza a los pies con regularidad.  Informe a su mdico si aparecen nuevos lunares o los que tiene se modifican, especialmente en su forma y color.  Tambin notifique al mdico si tiene un lunar que es ms grande que el tamao de una goma de lpiz.  Siempre use pantalla solar. Aplique pantalla solar de Kerry Dory y repetida a lo largo del Training and development officer.  Protjase usando mangas y  [employer], un sombrero de ala ancha y gafas para el sol, siempre que se encuentre en el exterior. ENFERMEDADES CARDACAS, DIABETES E HIPERTENSIN ARTERIAL   La hipertensin arterial causa enfermedades cardacas y [place] el riesgo de ictus. La hipertensin arterial es ms probable en los siguientes casos:  Las personas que tienen la presin arterial en el extremo del rango normal (100-139/85-89 mm Hg).  Las personas con sobrepeso u obesidad.  Las Retail banker.  Si usted tiene entre 18 y 39 aos, debe medirse la presin arterial cada 3 a 5 aos. Si usted tiene 40 aos o ms, debe medirse la presin arterial [employer]. Debe medirse la presin arterial dos veces: una vez cuando est en un Austin o una clnica y la otra vez cuando est en otro sitio. Registre el promedio de [employer]. Para controlar su presin arterial cuando no est en un Austin o [place] Isaac, puede usar lo siguiente:  Ardelia Mems mquina automtica para medir la presin arterial en una farmacia.  Un monitor para medir la presin arterial en el hogar.  Si tiene entre 59 y 42 aos, consulte a su mdico si debe tomar aspirina para prevenir el ictus.  Realcese exmenes de deteccin de la diabetes con regularidad. Esto incluye la toma de [place] de sangre para controlar el nivel de azcar en la sangre durante el [place].  Si tiene un peso normal y un bajo riesgo de padecer diabetes, realcese este anlisis cada tres aos despus de los 45aos.  Si tiene sobrepeso y un alto riesgo de padecer diabetes, considere someterse a este anlisis antes o con mayor frecuencia. PREVENCIN DE INFECCIONES  HepatitisB  Si tiene un riesgo ms alto de Museum/gallery curator hepatitis B, debe someterse a un examen de deteccin de este virus. Se considera que tiene un alto riesgo de Museum/gallery curator hepatitis B si:  Naci en un pas donde la hepatitis B es frecuente. Pregntele a su mdico qu pases son considerados de Recruitment consultant.  Sus padres nacieron en un pas de alto riesgo y usted no recibi una vacuna que lo proteja contra la hepatitis B (vacuna contra la hepatitis B).  [place].  [place] agujas para inyectarse drogas.  Vive con alguien que tiene hepatitis B.  Ha tenido sexo con alguien que tiene hepatitis B.  Recibe tratamiento de hemodilisis.  Toma ciertos medicamentos para el cncer, trasplante de rganos y afecciones autoinmunitarias. Hepatitis C  Se recomienda un anlisis de [place] para:  Todos los que nacieron entre 1945 y [phone].  Todas las personas que tengan un riesgo de haber contrado hepatitis C. Enfermedades de transmisin sexual (ETS).  Debe realizarse pruebas de deteccin de enfermedades de transmisin sexual (ETS), incluidas gonorrea y clamidia si:  Es sexualmente activo y es menor de 24aos.  Es mayor de 24aos, y Investment banker, operational informa que corre riesgo de tener este tipo de infecciones.  La actividad sexual ha cambiado desde que le hicieron la ltima prueba de deteccin y tiene un riesgo mayor de Best boy clamidia o Radio broadcast assistant. Pregntele al mdico si usted tiene riesgo.  Si no tiene el VIH, pero corre riesgo de infectarse por el virus, se recomienda tomar diariamente un medicamento recetado para evitar la infeccin. Esto se conoce como profilaxis previa a la exposicin. Se considera que est en riesgo si:  Es [place] sexualmente y no [place] preservativos habitualmente o no conoce el estado del VIH de sus Advertising copywriter.  Se inyecta drogas.  Es [place] sexualmente con Ardelia Mems pareja que tiene VIH. Consulte a su mdico para saber si tiene un alto riesgo de infectarse por el VIH. Si opta por comenzar la profilaxis previa a la exposicin, primero debe realizarse anlisis de deteccin del VIH. Luego, le harn anlisis cada 66mses mientras est tomando los medicamentos para la profilaxis previa a la exposicin.  ELehigh Valley [place]  Si es premenopusica y puede quedar eBladens[place] solicite a su  mdico asesoramiento previo a la concepcin.  Si puede quedar embarazada, tome 400 a [id number]([id number]) de cido fAnheuser-Busch  Si desea evitar el embarazo, hable con su mdico sobre el control de la natalidad (anticoncepcin). OSTEOPOROSIS Y MENOPAUSIA   La osteoporosis es una enfermedad en la que los huesos pierden los minerales y la fuerza por el avance de la edad. El resultado pueden ser fracturas graves en los h[place] El riesgo de osteoporosis puede identificarse con uArdelia Memsprueba de densidad sea.  Si tiene 65aos o ms, o si est en riesgo de sufrir osteoporosis y fracturas, pregunte a su mdico si debe someterse a exmenes.  Consulte a su mdico si debe tomar un suplemento de calcio o de vitamina D para reducir el riesgo de osteoporosis.  La menopausia puede presentar ciertos sntomas fsicos y rGaffer  La terapia de reemplazo hormonal puede reducir algunos de estos sntomas y rGaffer Consulte a su mdico para saber si la terapia de reemplazo hormonal es conveniente para usted.  INSTRUCCIONES PARA EL CUIDADO EN EL HOGAR   Realcese los estudios de rutina de la salud, dentales y de lPublic librarian  MK[place]il[place]  No consuma ningn producto que contenga tabaco, lo que incluye cigarrillos, tabaco de mHigher education careers advisero cPsychologist, sport and exercise  Si est embarazada, no beba alcohol.  Si est amamantando, reduzca el consumo de alcohol y la frecuencia con la que consume.  Si es mujer y no est embarazada limite el consumo de alcohol a no ms de 1 medida por da. Una medida equivale a 12onzas de cerveza, 5onzas de vino o 1onzas de bebidas alcohlicas de alta graduacin.  No consuma drogas.  No comparta agujas.  Solicite ayuda a su mdico si necesita apoyo o informacin para abandonar las drogas.  Informe a su mdico si a menudo se siente deprimido.  Notifique a su mdico si alguna vez ha sido vctima de abuso o si no se siente seguro en su hogar.   Esta  informacin no tiene cMarine scientistel consejo del mdico. Asegrese de hacerle al mdico cualquier pregunta que tenga.   Document Released: 04/27/2011 Document Revised: 05/29/2014 Elsevier Interactive Patient Education 2[employer]

## 2015-03-02 ENCOUNTER — Telehealth: Payer: Self-pay | Admitting: Adult Health

## 2015-03-02 NOTE — Telephone Encounter (Signed)
Spoke with patient on the phone and informed her of her lab results.

## 2015-08-12 ENCOUNTER — Emergency Department (INDEPENDENT_AMBULATORY_CARE_PROVIDER_SITE_OTHER)
Admission: EM | Admit: 2015-08-12 | Discharge: 2015-08-12 | Disposition: A | Discharge status: Home / Self Care | Payer: Medicare Other | Source: Home / Self Care | Attending: Family Medicine | Admitting: Family Medicine

## 2015-08-12 ENCOUNTER — Encounter (HOSPITAL_COMMUNITY): Payer: Self-pay | Admitting: Emergency Medicine

## 2015-08-12 DIAGNOSIS — B309 Viral conjunctivitis, unspecified: Secondary | ICD-10-CM | POA: Diagnosis not present

## 2015-08-12 MED ORDER — FLUORESCEIN SODIUM 1 MG OP STRP
ORAL_STRIP | OPHTHALMIC | Status: AC
  Filled 2015-08-12: qty 1

## 2015-08-12 MED ORDER — EYE WASH OPHTH SOLN
OPHTHALMIC | Status: AC
  Filled 2015-08-12: qty 118

## 2015-08-12 NOTE — ED Provider Notes (Signed)
CSN: [id number]     Arrival date & time 08/12/15  1434 History   First MD Initiated Contact with Patient 08/12/15 1711     Chief Complaint  Patient presents with  . Eye Drainage   (Consider location/radiation/quality/duration/timing/severity/associated sxs/prior Treatment) The history is provided by the patient and a relative. No language interpreter was used.  Visit conducted in [place].  Patient presents with complaint of red, itchy R eye starting 2-3 days ago. This morning noted it starting in the L eye as well.  Has had a yellowish discharge from the R eye, now starting from the L eye.  Uses eyeglasses usually, did not bring to the office today.  No true photophobia, no foreign body sensation. Did get shampoo in the R eye a few days ago, thought might have been causative.   Social Hx; Infant grandson recently with "pink eye". Patient moved to [place] from [place] almost 1 year ago. Does not work outside the home.   ROS: No fevers or chills; no eye pain; no photophobia. No fb sensation.   Past Medical History  Diagnosis Date  . Rheumatoid arthritis ([place])   . Hyperlipidemia   . Hypertension   . Depression    Past Surgical History  Procedure Laterality Date  . Knee surgery Bilateral    Family History  Problem Relation Age of Onset  . Heart disease Mother    Social History  Substance Use Topics  . Smoking status: Never Smoker   . Smokeless tobacco: None  . Alcohol Use: No   OB History    No data available     Review of Systems  Constitutional: Negative for fever, chills, diaphoresis, appetite change and fatigue.  HENT: Negative for postnasal drip, rhinorrhea, sneezing and sore throat.   Eyes: Positive for discharge, redness, itching and visual disturbance. Negative for photophobia and pain.  Respiratory: Negative for cough.     Allergies  Review of patient's allergies indicates no known allergies.  Home Medications   Prior to Admission medications   Medication Sig  Start Date End Date Taking? Authorizing Provider  Adalimumab 40 MG/0.8ML PSKT Inject into the skin. Inject 1 dose subq every 2 weeks or as directed.    Historical Provider, MD  bisoprolol-hydrochlorothiazide (ZIAC) 5-6.25 MG tablet Take 1 tablet by mouth daily. 03/01/15   Dorothyann Peng, NP  CALCIUM PO Take by mouth.    Historical Provider, MD  citalopram (CELEXA) 20 MG tablet Take 1 tablet (20 mg total) by mouth daily. 03/01/15   Dorothyann Peng, NP  methotrexate (RHEUMATREX) 2.5 MG tablet Take 2.5 mg by mouth once a week. Tale 6 pills by mouth every week.   Caution:Chemotherapy. Protect from light.    Historical Provider, MD  Multiple Vitamins-Minerals (CENTRUM SILVER ULTRA WOMENS PO) Take by mouth.    Historical Provider, MD   Meds Ordered and Administered this Visit  Medications - No data to display  BP 119/69 mmHg  Pulse 60  Temp(Src) 98.1 F (36.7 C) (Oral)  Resp 20  SpO2 100% No data found.   Physical Exam  Constitutional: She appears well-developed and well-nourished.  HENT:  Head: Normocephalic and atraumatic.  Left Ear: External ear normal.  Nose: Nose normal.  Mouth/Throat: Oropharynx is clear and moist. No oropharyngeal exudate.  Normocephalic.  Bilateral eyes with markedly injected sclerae.  PERRL. EOMI. Cloudy pupils. No purulence along inferior palpebral fissure. Eyelids painted, no chalazion/hordeolum noted.   R TM is not intact; no purulence or erythema, or bulging.  L TM is intact.     Eyes: EOM are normal. Pupils are equal, round, and reactive to light. Right eye exhibits discharge. Left eye exhibits discharge. No scleral icterus.  Neck: Normal range of motion. Neck supple. No thyromegaly present.  Lymphadenopathy:    She has no cervical adenopathy.    ED Course  Procedures (including critical care time)  Labs Review Labs Reviewed - No data to display  Imaging Review No results found.   Visual Acuity Review  Right Eye Distance:   Left Eye  Distance:   Bilateral Distance:    Right Eye Near:   Left Eye Near:    Bilateral Near:         MDM   1. Acute viral conjunctivitis of both eyes    Viral conjunctivitis, discussed minimizing spread, use of warm compresses frequently. Discussed natural history of the condition. To see her primary doctor if not improving, or if worsening.   Encouraged to establish with ENT for follow up of chronic R TM perforation, she used to see ENT in [place] where she lived until just about 1 year ago.   Dalbert Mayotte, MD    Willeen Niece, MD 08/12/15 [id number]

## 2015-08-12 NOTE — Discharge Instructions (Signed)
Fue un Civil Service fast streamer.  La molestia y el rojizo en los ojos se debe a una infeccion viral ("conjunctivitis").   Panos de agua tibia en ambos ojos, varias veces durante el dia.   [place] y la cara, limitar el contacto con los nietos por ahora para minimizar que se les transmita.    Conjuntivitis viral (Viral Conjunctivitis) La conjuntivitis viral es la inflamacin de la membrana transparente que cubre la parte blanca del ojo y la cara interna del prpado (conjuntiva). La inflamacin es causada por una infeccin viral. Los vasos sanguneos en la conjuntiva se inflaman, el ojo se vuelve de color rojo o rosa y a Proofreader. La conjuntivitis viral se puede transmitir fcilmente de una persona a otra (contagiosa). CAUSAS  La conjuntivitis viral es causada por un virus. Un virus es un tipo de microorganismo contagioso. Puede propagarse al tocar [employer] contaminados con el virus, como las manijas de las puertas o las toallas.  SNTOMAS  Los sntomas de la conjuntivitis viral pueden incluir lo siguiente:   Ojos rojos.  Lagrimeo u ojos llorosos.  Picazn en los ojos.  Sensacin de ardor en los ojos.  Secrecin transparente de los ojos.  Hinchazn de los prpados.  Sensacin de [employer].  Sensibilidad a Naval architect. DIAGNSTICO  La conjuntivitis viral se puede diagnosticar mediante la historia clnica y un examen fsico. Si tiene secrecin en el ojo, esta se puede analizar para descartar otras causas de conjuntivitis.  TRATAMIENTO  La conjuntivitis viral no responde a medicamentos utilizados para eliminar las bacterias (antibiticos). El tratamiento de la conjuntivitis viral pretende impedir que se desarrolle una infeccin bacteriana adems de la infeccin viral. El tratamiento tambin apunta a Public house manager sntomas, como la picazn. Esto puede realizar con antihistamnicos en gotas u otros medicamentos para los ojos.  [place] los medicamentos solamente como se lo haya indicado el mdico.  Evite tocarse o frotarse los ojos.  Aplquese un pao clido y limpio en el ojo durante 10 a 20 minutos, 3 a 4 veces al da.  Si [place] lentes de contacto, no los use hasta que se haya desaparecido la inflamacin y su mdico le indique que es seguro usarlos nuevamente. Pregunte a su mdico cmo esterilizar o volver a colocar sus lentes de contacto antes de usarlos nuevamente. Use anteojos hasta que pueda volver a usar los lentes de [place].  Evite usar [employer] ojos hasta que la inflamacin se haya ido. Descarte cosmticos viejos para los ojos que puedan estar contaminados.  Cambie o lave su almohada todos los [place].  No comparta las toallas o los paos. Esto puede propagar la infeccin.  Lave sus manos frecuentemente con agua y [place]. Use toallas de papel para secarse las manos.  Retire suavemente la secrecin de los ojos con un pao clido y hmedo o con una torunda de algodn.  Tenga mucho cuidado de no tocar el borde del prpado con el frasco de las gotas para los ojos o el tubo de la pomada cuando [employer] medicamentos en el ojo afectado. Esto evitar que se propague la infeccin al otro ojo o a Producer, television/film/video. SOLICITE ATENCIN MDICA SI:   Los sntomas no mejoran con Dispensing optician.  Siente un dolor cada vez ms intenso.  La visin se vuelve borrosa.  Tiene fiebre.  Siente dolor u observa hinchazn o enrojecimiento en la cara.  Aparecen nuevos sntomas.  Los  sntomas empeoran.   Esta informacin no tiene Marine scientist el consejo del mdico. Asegrese de hacerle al mdico cualquier pregunta que tenga.   Document Released: 05/29/2014 Elsevier Interactive Patient Education [employer].

## 2015-08-12 NOTE — ED Notes (Addendum)
Patient c/o eye redness and irritation with drainage in the right eye. Patient reports she got shampoo in her eye four days ago. She has been having changes in vision. Patient is in NAD.

## 2015-08-19 ENCOUNTER — Ambulatory Visit: Payer: Medicare Other | Admitting: Adult Health

## 2015-09-02 ENCOUNTER — Ambulatory Visit (INDEPENDENT_AMBULATORY_CARE_PROVIDER_SITE_OTHER): Payer: Medicare Other | Admitting: Adult Health

## 2015-09-02 ENCOUNTER — Encounter: Payer: Self-pay | Admitting: Adult Health

## 2015-09-02 VITALS — BP 112/70 | Temp 98.3°F | Wt 147.5 lb

## 2015-09-02 DIAGNOSIS — F329 Major depressive disorder, single episode, unspecified: Secondary | ICD-10-CM

## 2015-09-02 DIAGNOSIS — M79672 Pain in left foot: Secondary | ICD-10-CM

## 2015-09-02 DIAGNOSIS — F32A Depression, unspecified: Secondary | ICD-10-CM

## 2015-09-02 MED ORDER — CITALOPRAM HYDROBROMIDE 20 MG PO TABS: 20.0000 mg | ORAL_TABLET | Freq: Every day | ORAL | Status: DC

## 2015-09-02 NOTE — Progress Notes (Signed)
Subjective:    Patient ID: Rachel Austin, female    DOB: 15-Jan-1956, 60 y.o.   MRN: [id number]  HPI   60 year old female who presents with spanish interpreter with multiple issues.   1) Depression - She feels as though her depression is well controlled on Celexa 20 mg. She continues to have bouts of crying, is happens when she becomes frustrated she is unable to do something such as open jar or open a bottle of water when she is at home. Not being able to do something as simple as this causes her much frustration. Overall she endorses feeling much better since being on Celexa.  2) Left Achillis Pain - She recently returned from [place], where she spent 4 months visiting family. 81 where she is from his extremely heavily sometime during her visit she started to have left Achilles pain. The pain persists since she's been home and she describes it as a sharp stabbing pain in the morning, the pain goes away after she is up moving around. She denies any injuries or trauma to the area. Is not using any over-the-counter medications for pain relief    Review of Systems  Constitutional: Negative.   HENT: Negative.   Respiratory: Negative.   Cardiovascular: Negative.   Musculoskeletal:       Left Achilles heel pain  Skin: Negative.   Neurological: Negative.   Hematological: Negative.   All other systems reviewed and are negative.  Past Medical History  Diagnosis Date  . Rheumatoid arthritis ([place])   . Hyperlipidemia   . Hypertension   . Depression     Social History   Social History  . Marital Status: Single    Spouse Name: N/A  . Number of Children: N/A  . Years of Education: N/A   Occupational History  . Not on file.   Social History Main Topics  . Smoking status: Never Smoker   . Smokeless tobacco: Not on file  . Alcohol Use: No  . Drug Use: No  . Sexual Activity: No   Other Topics Concern  . Not on file   Social History Narrative   Moved from [place] to [facility] in  December    Past Surgical History  Procedure Laterality Date  . Knee surgery Bilateral     Family History  Problem Relation Age of Onset  . Heart disease Mother     No Known Allergies  Current Outpatient Prescriptions on File Prior to Visit  Medication Sig Dispense Refill  . Adalimumab 40 MG/0.8ML PSKT Inject into the skin. Inject 1 dose subq every 2 weeks or as directed.    . bisoprolol-hydrochlorothiazide (ZIAC) 5-6.25 MG tablet Take 1 tablet by mouth daily. 90 tablet 3  . CALCIUM PO Take by mouth.    . citalopram (CELEXA) 20 MG tablet Take 1 tablet (20 mg total) by mouth daily. 90 tablet 3  . methotrexate (RHEUMATREX) 2.5 MG tablet Take 2.5 mg by mouth once a week. Tale 6 pills by mouth every week.   Caution:Chemotherapy. Protect from light.    . Multiple Vitamins-Minerals (CENTRUM SILVER ULTRA WOMENS PO) Take by mouth.     No current facility-administered medications on file prior to visit.    BP 112/70 mmHg  Temp(Src) 98.3 F (36.8 C) (Oral)  Wt 147 lb 8 oz (66.906 kg)        Objective:   Physical Exam  Constitutional: She is oriented to person, place, and time. She appears well-developed and well-nourished. No  distress.  Cardiovascular: Normal rate, regular rhythm, normal heart sounds and intact distal pulses.  Exam reveals no gallop and no friction rub.   No murmur heard. Pulmonary/Chest: Effort normal and breath sounds normal. No respiratory distress. She has no wheezes. She has no rales. She exhibits no tenderness.  Musculoskeletal: Normal range of motion. She exhibits no edema or tenderness.  Neurological: She is alert and oriented to person, place, and time.  Skin: Skin is warm and dry. No rash noted. She is not diaphoretic. No erythema. No pallor.  Psychiatric: She has a normal mood and affect. Her behavior is normal. Judgment and thought content normal.  Nursing note and vitals reviewed.     Assessment & Plan:  1. Depression - Continue with current  dose of Celexa - citalopram (CELEXA) 20 MG tablet; Take 1 tablet (20 mg total) by mouth daily.  Dispense: 90 tablet; Refill: 3 - Advised easy open lids and/or electric can opener  2. Heel pain, left - No pain in the office, advised stretching exercises -Ibuprofen 400 mg before bed -Follow-up if no improvement  Dorothyann Peng, NP

## 2015-10-02 DIAGNOSIS — S0500XA Injury of conjunctiva and corneal abrasion without foreign body, unspecified eye, initial encounter: Secondary | ICD-10-CM | POA: Diagnosis not present

## 2015-10-03 DIAGNOSIS — S0500XA Injury of conjunctiva and corneal abrasion without foreign body, unspecified eye, initial encounter: Secondary | ICD-10-CM | POA: Diagnosis not present

## 2015-10-11 DIAGNOSIS — H16223 Keratoconjunctivitis sicca, not specified as Sjogren's, bilateral: Secondary | ICD-10-CM | POA: Diagnosis not present

## 2015-10-11 DIAGNOSIS — H18461 Peripheral corneal degeneration, right eye: Secondary | ICD-10-CM | POA: Diagnosis not present

## 2015-10-11 DIAGNOSIS — H17821 Peripheral opacity of cornea, right eye: Secondary | ICD-10-CM | POA: Diagnosis not present

## 2015-10-19 DIAGNOSIS — Z79899 Other long term (current) drug therapy: Secondary | ICD-10-CM | POA: Diagnosis not present

## 2015-10-19 DIAGNOSIS — H16221 Keratoconjunctivitis sicca, not specified as Sjogren's, right eye: Secondary | ICD-10-CM | POA: Diagnosis not present

## 2015-10-19 DIAGNOSIS — M0580 Other rheumatoid arthritis with rheumatoid factor of unspecified site: Secondary | ICD-10-CM | POA: Diagnosis not present

## 2015-10-20 DIAGNOSIS — H2513 Age-related nuclear cataract, bilateral: Secondary | ICD-10-CM | POA: Diagnosis not present

## 2015-10-20 DIAGNOSIS — H18461 Peripheral corneal degeneration, right eye: Secondary | ICD-10-CM | POA: Diagnosis not present

## 2015-10-20 DIAGNOSIS — H04123 Dry eye syndrome of bilateral lacrimal glands: Secondary | ICD-10-CM | POA: Diagnosis not present

## 2015-10-21 DIAGNOSIS — Z79899 Other long term (current) drug therapy: Secondary | ICD-10-CM | POA: Diagnosis not present

## 2015-11-16 DIAGNOSIS — M0580 Other rheumatoid arthritis with rheumatoid factor of unspecified site: Secondary | ICD-10-CM | POA: Diagnosis not present

## 2015-11-16 DIAGNOSIS — Z79899 Other long term (current) drug therapy: Secondary | ICD-10-CM | POA: Diagnosis not present

## 2015-11-16 DIAGNOSIS — H16221 Keratoconjunctivitis sicca, not specified as Sjogren's, right eye: Secondary | ICD-10-CM | POA: Diagnosis not present

## 2015-11-17 DIAGNOSIS — H16221 Keratoconjunctivitis sicca, not specified as Sjogren's, right eye: Secondary | ICD-10-CM | POA: Diagnosis not present

## 2015-11-17 DIAGNOSIS — H18461 Peripheral corneal degeneration, right eye: Secondary | ICD-10-CM | POA: Diagnosis not present

## 2015-11-17 DIAGNOSIS — H16123 Filamentary keratitis, bilateral: Secondary | ICD-10-CM | POA: Diagnosis not present

## 2016-01-19 DIAGNOSIS — H16123 Filamentary keratitis, bilateral: Secondary | ICD-10-CM | POA: Diagnosis not present

## 2016-01-19 DIAGNOSIS — H2513 Age-related nuclear cataract, bilateral: Secondary | ICD-10-CM | POA: Diagnosis not present

## 2016-01-19 DIAGNOSIS — H16223 Keratoconjunctivitis sicca, not specified as Sjogren's, bilateral: Secondary | ICD-10-CM | POA: Diagnosis not present

## 2016-02-15 DIAGNOSIS — H16223 Keratoconjunctivitis sicca, not specified as Sjogren's, bilateral: Secondary | ICD-10-CM | POA: Diagnosis not present

## 2016-02-15 DIAGNOSIS — H16123 Filamentary keratitis, bilateral: Secondary | ICD-10-CM | POA: Diagnosis not present

## 2016-02-15 DIAGNOSIS — H2513 Age-related nuclear cataract, bilateral: Secondary | ICD-10-CM | POA: Diagnosis not present

## 2016-02-16 DIAGNOSIS — H15009 Unspecified scleritis, unspecified eye: Secondary | ICD-10-CM | POA: Diagnosis not present

## 2016-02-16 DIAGNOSIS — M0580 Other rheumatoid arthritis with rheumatoid factor of unspecified site: Secondary | ICD-10-CM | POA: Diagnosis not present

## 2016-02-16 DIAGNOSIS — Z23 Encounter for immunization: Secondary | ICD-10-CM | POA: Diagnosis not present

## 2016-02-16 DIAGNOSIS — H16221 Keratoconjunctivitis sicca, not specified as Sjogren's, right eye: Secondary | ICD-10-CM | POA: Diagnosis not present

## 2016-02-16 DIAGNOSIS — Z79899 Other long term (current) drug therapy: Secondary | ICD-10-CM | POA: Diagnosis not present

## 2016-03-01 DIAGNOSIS — H16221 Keratoconjunctivitis sicca, not specified as Sjogren's, right eye: Secondary | ICD-10-CM | POA: Diagnosis not present

## 2016-03-01 DIAGNOSIS — Z79899 Other long term (current) drug therapy: Secondary | ICD-10-CM | POA: Diagnosis not present

## 2016-03-01 DIAGNOSIS — M0580 Other rheumatoid arthritis with rheumatoid factor of unspecified site: Secondary | ICD-10-CM | POA: Diagnosis not present

## 2016-03-14 ENCOUNTER — Other Ambulatory Visit: Payer: Self-pay | Admitting: Adult Health

## 2016-03-14 DIAGNOSIS — F329 Major depressive disorder, single episode, unspecified: Secondary | ICD-10-CM

## 2016-03-14 DIAGNOSIS — F32A Depression, unspecified: Secondary | ICD-10-CM

## 2016-03-27 DIAGNOSIS — H2513 Age-related nuclear cataract, bilateral: Secondary | ICD-10-CM | POA: Diagnosis not present

## 2016-03-27 DIAGNOSIS — H16223 Keratoconjunctivitis sicca, not specified as Sjogren's, bilateral: Secondary | ICD-10-CM | POA: Diagnosis not present

## 2016-03-27 DIAGNOSIS — H16123 Filamentary keratitis, bilateral: Secondary | ICD-10-CM | POA: Diagnosis not present

## 2016-03-27 DIAGNOSIS — H17823 Peripheral opacity of cornea, bilateral: Secondary | ICD-10-CM | POA: Diagnosis not present

## 2016-05-17 ENCOUNTER — Other Ambulatory Visit: Payer: Self-pay | Admitting: Adult Health

## 2016-05-17 DIAGNOSIS — F329 Major depressive disorder, single episode, unspecified: Secondary | ICD-10-CM

## 2016-05-17 DIAGNOSIS — F32A Depression, unspecified: Secondary | ICD-10-CM

## 2016-05-17 DIAGNOSIS — I10 Essential (primary) hypertension: Secondary | ICD-10-CM

## 2016-05-17 NOTE — Telephone Encounter (Signed)
Patient needs to schedule a Physical for more refills

## 2016-05-22 DIAGNOSIS — H269 Unspecified cataract: Secondary | ICD-10-CM

## 2016-05-22 HISTORY — DX: Unspecified cataract: H26.9

## 2016-10-17 ENCOUNTER — Telehealth: Payer: Self-pay | Admitting: Adult Health

## 2016-10-17 ENCOUNTER — Other Ambulatory Visit: Payer: Self-pay

## 2016-10-17 DIAGNOSIS — F3289 Other specified depressive episodes: Secondary | ICD-10-CM

## 2016-10-17 MED ORDER — CITALOPRAM HYDROBROMIDE 20 MG PO TABS: 20.0000 mg | ORAL_TABLET | Freq: Every day | ORAL | 1 refills | Status: DC

## 2016-10-17 NOTE — Telephone Encounter (Signed)
Pt need new Rx for citalopram  Pharm:  [facility]

## 2016-10-17 NOTE — Telephone Encounter (Signed)
Ok to refill 

## 2016-10-17 NOTE — Telephone Encounter (Signed)
Prescription has been refilled.

## 2016-10-18 ENCOUNTER — Ambulatory Visit (INDEPENDENT_AMBULATORY_CARE_PROVIDER_SITE_OTHER): Payer: Medicare Other | Admitting: Adult Health

## 2016-10-18 ENCOUNTER — Encounter: Payer: Self-pay | Admitting: Adult Health

## 2016-10-18 VITALS — BP 112/62 | Temp 98.6°F | Ht 60.0 in | Wt 135.4 lb

## 2016-10-18 DIAGNOSIS — A084 Viral intestinal infection, unspecified: Secondary | ICD-10-CM

## 2016-10-18 NOTE — Progress Notes (Signed)
Subjective:    Patient ID: Rachel Austin, female    DOB: 03-03-1956, 61 y.o.   MRN: [id number]  HPI   61 year old female who presents with spanish interpreter with the complaint of 24 hours of nausea, vomiting, diarrhea, and subject fevers.   She denies any sick contacts or recent travel   She has been able to keep food and drink on her stomach without vomiting. Her last episode of vomiting was yesterday afternoon and the last episode of diarrhea was yesterday afternoon as well.   Today she reports that she is feeling better today than yesterday.   She has not started any new medications and has not been using any OTC medication to help with the nausea or vomiting.    Review of Systems See HPI   Past Medical History:  Diagnosis Date  . Depression   . Hyperlipidemia   . Hypertension   . Rheumatoid arthritis [facility])     Social History   Social History  . Marital status: Single    Spouse name: N/A  . Number of children: N/A  . Years of education: N/A   Occupational History  . Not on file.   Social History Main Topics  . Smoking status: Never Smoker  . Smokeless tobacco: Never Used  . Alcohol use No  . Drug use: No  . Sexual activity: No   Other Topics Concern  . Not on file   Social History Narrative   Moved from [place] to [facility] in December    Past Surgical History:  Procedure Laterality Date  . KNEE SURGERY Bilateral     Family History  Problem Relation Age of Onset  . Heart disease Mother     No Known Allergies  Current Outpatient Prescriptions on File Prior to Visit  Medication Sig Dispense Refill  . Adalimumab 40 MG/0.8ML PSKT Inject into the skin. Inject 1 dose subq every 2 weeks or as directed.    . bisoprolol-hydrochlorothiazide (ZIAC) 5-6.25 MG tablet TAKE ONE TABLET BY MOUTH ONCE DAILY 90 tablet 1  . CALCIUM PO Take by mouth.    . citalopram (CELEXA) 20 MG tablet Take 1 tablet (20 mg total) by mouth daily. 90 tablet 1  . methotrexate (RHEUMATREX)  2.5 MG tablet Take 2.5 mg by mouth once a week. Tale 6 pills by mouth every week.   Caution:Chemotherapy. Protect from light.    . Multiple Vitamins-Minerals (CENTRUM SILVER ULTRA WOMENS PO) Take by mouth.     No current facility-administered medications on file prior to visit.     BP 112/62 (BP Location: Left Arm, Patient Position: Sitting, Cuff Size: Normal)   Temp 98.6 F (37 C) (Oral)   Ht 5' (1.524 m)   Wt 135 lb 6.4 oz (61.4 kg)   BMI 26.44 kg/m       Objective:   Physical Exam  Constitutional: She is oriented to person, place, and time. She appears well-developed and well-nourished. No distress.  Cardiovascular: Normal rate, regular rhythm, normal heart sounds and intact distal pulses.  Exam reveals no gallop and no friction rub.   No murmur heard. Pulmonary/Chest: Effort normal and breath sounds normal. No respiratory distress. She has no wheezes. She has no rales. She exhibits no tenderness.  Abdominal: Soft. Bowel sounds are normal. She exhibits no distension and no mass. There is no tenderness. There is no rebound and no guarding.  Neurological: She is alert and oriented to person, place, and time.  Skin: Skin is warm  and dry. No rash noted. She is not diaphoretic. No erythema. No pallor.  Psychiatric: She has a normal mood and affect. Her behavior is normal. Judgment and thought content normal.  Nursing note and vitals reviewed.     Assessment & Plan:  1. Viral gastroenteritis - Seems to be improving  - Bland diet for next 72 hours - Refused Zofran  - Follow up as needed  - reminded her that she is do for her annual physical exam   Dorothyann Peng, NP

## 2016-10-18 NOTE — Patient Instructions (Addendum)
It was great seeing you today   I think your symptoms are from a viral gastro-intestional bug   Please eat a bland diet over the next 3 days   Follow up for your physical      Gastroenteritis viral en los adultos (Viral Gastroenteritis, Adult) La gastroenteritis viral tambin se conoce como gripe estomacal. Esta afeccin es el resultado de determinados grmenes (virus). Estos grmenes pueden transmitirse de Ardelia Mems persona a otra con mucha facilidad (son muy contagiosos). Esta afeccin puede provocar heces lquidas (diarrea), fiebre y vmitos de forma repentina. La diarrea y los vmitos pueden hacer que se sienta dbil y que se deshidrate. La deshidratacin puede hacerlo sentir cansado y sediento, producirle sequedad en la boca y disminuir la frecuencia con la que [place]. [place] [employer] y las personas que tienen otras enfermedades o un sistema de defensa (sistema inmunitario) dbil tienen mayor riesgo de sufrir deshidratacin. Es importante restituir los lquidos que se pierden a causa de la diarrea y los vmitos. CUIDADOS EN EL HOGAR Siga las indicaciones del mdico sobre cmo debe cuidarse en su casa. Comida y bebida Siga estas indicaciones como se lo haya recomendado el mdico:  Tome una solucin de rehidratacin oral (SRO). Es Ardelia Mems bebida que se vende en farmacias y tiendas.  Beba lquidos claros en pequeas cantidades segn le sea posible. Por ejemplo:  [place].  Cubitos de hielo.  Jugo de frutas diluido.  Bebidas deportivas de bajas caloras.  En la medida en que pueda, consuma alimentos blandos y fciles de digerir en pequeas cantidades, por ejemplo, los siguientes:  Bananas.  Pur de [employer].  Arroz.  Carnes bajas en grasa Dorothea Glassman).  Tostadas.  Galletas.  Evite los lquidos con alto contenido de Location manager o cafena.  Evite el alcohol.  Evite los alimentos picantes o grasos. Instrucciones generales  Beba suficiente lquido para mantener el pis (orina) claro o de  color amarillo plido.  Lvese las manos con frecuencia. Use un desinfectante para manos si no dispone de [place] y [place].  Asegrese de que todas las personas que viven en su casa se laven bien las manos y con frecuencia.  Descanse en su casa hasta sentirse mejor.  Tome los medicamentos de venta libre y los recetados solamente como se lo haya indicado el mdico.  Controle su afeccin para ver si hay cambios.  Tome un bao caliente para ayudar a Transport planner ardor o el dolor que produce la diarrea.  Concurra a todas las visitas de control como se lo haya indicado el mdico. Esto es importante. SOLICITE AYUDA SI:  No puede retener los lquidos.  Los sntomas empeoran.  Aparecen nuevos sntomas.  Se siente mareado o siente que va a desvanecerse.  Tiene calambres musculares. SOLICITE AYUDA DE INMEDIATO SI:  Siente dolor en el pecho.  Se siente muy dbil o se desvanece (se desmaya).  Observa sangre en el vmito.  El vmito se asemeja al poso del caf.  Tiene heces con sangre, de color negro, o con aspecto alquitranado.  Siente un dolor de cabeza intenso, rigidez en el cuello, o ambas cosas.  Tiene una erupcin cutnea.  Tiene dolor muy intenso en el vientre (abdomen), clicos o meteorismo.  Tiene dificultad para respirar.  Respira muy rpido.  Su corazn late muy rpidamente.  Siente la piel fra y hmeda.  Se siente confundido.  Siente dolor al [employer].  Tiene signos de deshidratacin, por ejemplo:  La orina es [place], es muy escasa o no [place].  Labios agrietados.  [employer].  Ojos hundidos.  Somnolencia.  Debilidad. Esta informacin no tiene Marine scientist el consejo del mdico. Asegrese de hacerle al mdico cualquier pregunta que tenga. Document Released: 09/24/2008 Document Revised: 08/30/2015 Document Reviewed: 01/12/2015 Elsevier Interactive Patient Education  2017 [employer].

## 2016-11-08 ENCOUNTER — Ambulatory Visit (INDEPENDENT_AMBULATORY_CARE_PROVIDER_SITE_OTHER): Payer: Medicare Other | Admitting: Adult Health

## 2016-11-08 ENCOUNTER — Encounter: Payer: Self-pay | Admitting: Adult Health

## 2016-11-08 VITALS — BP 118/58 | Temp 98.3°F | Ht 60.0 in | Wt 134.2 lb

## 2016-11-08 DIAGNOSIS — Z0000 Encounter for general adult medical examination without abnormal findings: Secondary | ICD-10-CM | POA: Diagnosis not present

## 2016-11-08 DIAGNOSIS — E782 Mixed hyperlipidemia: Secondary | ICD-10-CM

## 2016-11-08 DIAGNOSIS — Z1211 Encounter for screening for malignant neoplasm of colon: Secondary | ICD-10-CM | POA: Diagnosis not present

## 2016-11-08 DIAGNOSIS — Z1159 Encounter for screening for other viral diseases: Secondary | ICD-10-CM | POA: Diagnosis not present

## 2016-11-08 DIAGNOSIS — I10 Essential (primary) hypertension: Secondary | ICD-10-CM

## 2016-11-08 DIAGNOSIS — F329 Major depressive disorder, single episode, unspecified: Secondary | ICD-10-CM | POA: Diagnosis not present

## 2016-11-08 LAB — CBC WITH DIFFERENTIAL/PLATELET
BASOS PCT: 0.8 % (ref 0.0–3.0)
Basophils Absolute: 0.1 10*3/uL (ref 0.0–0.1)
EOS ABS: 0.1 10*3/uL (ref 0.0–0.7)
Eosinophils Relative: 1.7 % (ref 0.0–5.0)
HEMATOCRIT: 33.4 % — AB (ref 36.0–46.0)
Hemoglobin: 10.6 g/dL — ABNORMAL LOW (ref 12.0–15.0)
Lymphocytes Relative: 44 % (ref 12.0–46.0)
Lymphs Abs: 2.8 10*3/uL (ref 0.7–4.0)
MCHC: 31.8 g/dL (ref 30.0–36.0)
MCV: 82.1 fl (ref 78.0–100.0)
MONO ABS: 0.6 10*3/uL (ref 0.1–1.0)
Monocytes Relative: 9.1 % (ref 3.0–12.0)
NEUTROS ABS: 2.9 10*3/uL (ref 1.4–7.7)
NEUTROS PCT: 44.4 % (ref 43.0–77.0)
PLATELETS: 420 10*3/uL — AB (ref 150.0–400.0)
RBC: 4.06 Mil/uL (ref 3.87–5.11)
RDW: 16.2 % — AB (ref 11.5–15.5)
WBC: 6.4 10*3/uL (ref 4.0–10.5)

## 2016-11-08 LAB — LIPID PANEL
CHOL/HDL RATIO: 4
Cholesterol: 192 mg/dL (ref 0–200)
HDL: 50.4 mg/dL (ref >39.00)
LDL Cholesterol: 118 mg/dL — ABNORMAL HIGH (ref 0–99)
NonHDL: 141.17
TRIGLYCERIDES: 117 mg/dL (ref 0.0–149.0)
VLDL: 23.4 mg/dL (ref 0.0–40.0)

## 2016-11-08 LAB — BASIC METABOLIC PANEL
BUN: 16 mg/dL (ref 6–23)
CALCIUM: 10.1 mg/dL (ref 8.4–10.5)
CO2: 30 mEq/L (ref 19–32)
CREATININE: 0.55 mg/dL (ref 0.40–1.20)
Chloride: 101 mEq/L (ref 96–112)
GFR: 119.77 mL/min (ref >60.00)
GLUCOSE: 84 mg/dL (ref 70–99)
Potassium: 4.4 mEq/L (ref 3.5–5.1)
Sodium: 137 mEq/L (ref 135–145)

## 2016-11-08 LAB — HEPATIC FUNCTION PANEL
ALT: 10 U/L (ref 0–35)
AST: 15 U/L (ref 0–37)
Albumin: 3.7 g/dL (ref 3.5–5.2)
Alkaline Phosphatase: 79 U/L (ref 39–117)
BILIRUBIN DIRECT: 0.1 mg/dL (ref 0.0–0.3)
BILIRUBIN TOTAL: 0.4 mg/dL (ref 0.2–1.2)
TOTAL PROTEIN: 7.8 g/dL (ref 6.0–8.3)

## 2016-11-08 LAB — TSH: TSH: 2.99 u[IU]/mL (ref 0.35–4.50)

## 2016-11-08 NOTE — Progress Notes (Signed)
Subjective:    Patient ID: Rachel Austin, female    DOB: [birth year], 61 y.o.   MRN: [id number]  HPI  Patient presents for yearly preventative medicine examination. She is a pleasant 61 year old [place] speaking female who  has a past medical history of Depression; Hyperlipidemia; Hypertension; and Rheumatoid arthritis ([place]).   All immunizations and health maintenance protocols were reviewed with the patient and needed orders were placed.  Appropriate screening laboratory values were ordered for the patient including screening of hyperlipidemia, renal function and hepatic function.  Medication reconciliation,  past medical history, social history, problem list and allergies were reviewed in detail with the patient  Goals were established with regard to weight loss, exercise, and  diet in compliance with medications. She eats a heart healthy diet of fruits, vegetables, fish and lean meats  She is currently taking Ziac 5-6.25 for hypertension management today in the office her blood pressure is 118/58.   Depression is controlled well with Celexa 20 mg.   She is being followed by Rheumatology for RA  Her last colonoscopy was in 2008.  She is due for pap and mammogram. She would like to see GYN  Review of Systems  Constitutional: Negative.   HENT: Negative.   Eyes: Negative.   Respiratory: Negative.   Cardiovascular: Negative.   Gastrointestinal: Negative.   Endocrine: Negative.   Genitourinary: Negative.   Musculoskeletal: Positive for joint swelling.  Skin: Negative.   Allergic/Immunologic: Negative.   Neurological: Negative.   Hematological: Negative.   Psychiatric/Behavioral: Negative.   All other systems reviewed and are negative.  Past Medical History:  Diagnosis Date  . Depression   . Hyperlipidemia   . Hypertension   . Rheumatoid arthritis [facility])     Social History   Social History  . Marital status: Single    Spouse name: N/A  . Number of children: N/A  .  Years of education: N/A   Occupational History  . Not on file.   Social History Main Topics  . Smoking status: Never Smoker  . Smokeless tobacco: Never Used  . Alcohol use No  . Drug use: No  . Sexual activity: No   Other Topics Concern  . Not on file   Social History Narrative   Moved from [place] to [facility] in December    Past Surgical History:  Procedure Laterality Date  . KNEE SURGERY Bilateral     Family History  Problem Relation Age of Onset  . Heart disease Mother     No Known Allergies  Current Outpatient Prescriptions on File Prior to Visit  Medication Sig Dispense Refill  . Adalimumab 40 MG/0.8ML PSKT Inject into the skin. Inject 1 dose subq every 2 weeks or as directed.    . bisoprolol-hydrochlorothiazide (ZIAC) 5-6.25 MG tablet TAKE ONE TABLET BY MOUTH ONCE DAILY 90 tablet 1  . CALCIUM PO Take by mouth.    . citalopram (CELEXA) 20 MG tablet Take 1 tablet (20 mg total) by mouth daily. 90 tablet 1  . methotrexate (RHEUMATREX) 2.5 MG tablet Take 2.5 mg by mouth once a week. Tale 6 pills by mouth every week.   Caution:Chemotherapy. Protect from light.    . Multiple Vitamins-Minerals (CENTRUM SILVER ULTRA WOMENS PO) Take by mouth.     No current facility-administered medications on file prior to visit.     BP (!) 118/58 (BP Location: Left Arm, Patient Position: Sitting, Cuff Size: Normal)   Temp 98.3 F (36.8 C) (Oral)   Ht  5' (1.524 m)   Wt 134 lb 3.2 oz (60.9 kg)   BMI 26.21 kg/m       Objective:   Physical Exam  Constitutional: She is oriented to person, place, and time. She appears well-developed and well-nourished. No distress.  HENT:  Head: Normocephalic and atraumatic.  Right Ear: External ear normal.  Left Ear: External ear normal.  Nose: Nose normal.  Mouth/Throat: Oropharynx is clear and moist. No oropharyngeal exudate.  Eyes: Conjunctivae and EOM are normal. Pupils are equal, round, and reactive to light. Right eye exhibits no discharge. Left  eye exhibits no discharge. No scleral icterus.  Neck: Normal range of motion. Neck supple. No JVD present. Carotid bruit is not present. No tracheal deviation present. No thyromegaly present.  Cardiovascular: Normal rate, regular rhythm, normal heart sounds and intact distal pulses.  Exam reveals no gallop and no friction rub.   No murmur heard. Pulmonary/Chest: Effort normal and breath sounds normal. No stridor. No respiratory distress. She has no wheezes. She has no rales. She exhibits no tenderness.  Abdominal: Soft. Bowel sounds are normal. She exhibits no distension and no mass. There is no tenderness. There is no rebound and no guarding.  Genitourinary:  Genitourinary Comments: Refused breast and GYN exam   Musculoskeletal: Normal range of motion. She exhibits no edema, tenderness or deformity.  Deformity of digits on bilateral hands from RA  Lymphadenopathy:    She has no cervical adenopathy.  Neurological: She is alert and oriented to person, place, and time. She has normal reflexes. She displays normal reflexes. No cranial nerve deficit. She exhibits normal muscle tone. Coordination normal.  Skin: Skin is warm and dry. No rash noted. She is not diaphoretic. No erythema. No pallor.  Psychiatric: She has a normal mood and affect. Her behavior is normal. Thought content normal.  Nursing note and vitals reviewed.     Assessment & Plan:  1. Routine general medical examination at a health care facility - Follow up in 12 months  - Will refer to GYN for pap, mammogram, and breast exam - Basic metabolic panel - CBC with Differential/Platelet - Hepatic function panel - Lipid panel - TSH - Hep C Antibody  2. Essential hypertension - Well controled . No change in medication  - Encouraged a heart healthy diet.  - Basic metabolic panel - CBC with Differential/Platelet - Hepatic function panel - Lipid panel - TSH  3. Mixed hyperlipidemia - Consider statin  - Basic metabolic panel -  CBC with Differential/Platelet - Hepatic function panel - Lipid panel - TSH  4. Reactive depression - Well controlled on current medication.  - No change in therapy   5. Need for hepatitis C screening test  - Hep C Antibody  6. Colon cancer screening  - Ambulatory referral to Gastroenterology   Dorothyann Peng, NP

## 2016-11-08 NOTE — Patient Instructions (Signed)
  Fue grandioso verte hoy  Alguien lo llamar para [employer] colonoscopia.  Llamar a tu hija con los resultados de tu laboratorio  Por favor, haga un seguimiento con GYN para el Papanicolaou, mamografa y examen de los senos  Te ver en un ao o ms temprano si es necesario

## 2016-11-09 ENCOUNTER — Telehealth: Payer: Self-pay | Admitting: Adult Health

## 2016-11-09 LAB — HEPATITIS C ANTIBODY: HCV Ab: NEGATIVE

## 2016-11-09 NOTE — Telephone Encounter (Signed)
See result note.  

## 2016-11-09 NOTE — Telephone Encounter (Signed)
Brooke pts daughter called back and asked if you could please call her back after 2:00 due to her being a work.

## 2016-11-16 ENCOUNTER — Other Ambulatory Visit: Payer: Self-pay | Admitting: Adult Health

## 2016-11-16 DIAGNOSIS — I10 Essential (primary) hypertension: Secondary | ICD-10-CM

## 2016-12-08 ENCOUNTER — Encounter: Payer: Self-pay | Admitting: Adult Health

## 2016-12-15 ENCOUNTER — Encounter: Payer: Self-pay | Admitting: Gastroenterology

## 2016-12-26 ENCOUNTER — Telehealth: Payer: Self-pay

## 2016-12-26 NOTE — Telephone Encounter (Signed)
Patient was scheduled for PV today at 8:30 Am. The interpreter showed up for the appointment but the patient did not. I spoke with the patient and asked her to call and reschedule her PV appointment today. If she failed to reschedule today, patient was informed that we would cancel her colonoscopy procedure at 5:00 pm. There was somewhat of a language barrier, but patient did write down the number. The interpreter also said that she would call and inform the patient that she needs to reschedule her appointment.   Riki Sheer, LPN  ( PV )

## 2017-01-03 ENCOUNTER — Encounter: Payer: Medicare Other | Admitting: Gastroenterology

## 2017-01-29 HISTORY — PX: FINGER SURGERY: SHX640

## 2017-01-31 ENCOUNTER — Ambulatory Visit (AMBULATORY_SURGERY_CENTER): Payer: Self-pay

## 2017-01-31 ENCOUNTER — Telehealth: Payer: Self-pay | Admitting: Adult Health

## 2017-01-31 ENCOUNTER — Encounter: Payer: Self-pay | Admitting: Gastroenterology

## 2017-01-31 VITALS — Ht 60.0 in | Wt 137.2 lb

## 2017-01-31 DIAGNOSIS — Z1211 Encounter for screening for malignant neoplasm of colon: Secondary | ICD-10-CM

## 2017-01-31 MED ORDER — NA SULFATE-K SULFATE-MG SULF 17.5-3.13-1.6 GM/177ML PO SOLN: ORAL | 0 refills | Status: DC

## 2017-01-31 NOTE — Progress Notes (Signed)
Pt and daughterSharyn Lull) came into the office today for the PV prior to the colon on 02/14/17. Pt understands a little English, but the daughter helped with paperwork, prep instructions and signed consent for pt. All questions were answered.  The daughter will be with the pt at her colonoscopy and will interpret as needed.  Per daughter, pt has no allergies to soy or egg products.Pt not taking any weight loss meds or using  O2 at home.  Pt refused Emmi video.

## 2017-01-31 NOTE — Telephone Encounter (Signed)
Rose Phi dropped off FMLA forms for [employer] to pick up at [phone]  Disposition: Dr's folder

## 2017-02-01 DIAGNOSIS — M069 Rheumatoid arthritis, unspecified: Secondary | ICD-10-CM

## 2017-02-01 DIAGNOSIS — F329 Major depressive disorder, single episode, unspecified: Secondary | ICD-10-CM

## 2017-02-01 NOTE — Telephone Encounter (Signed)
Completed.

## 2017-02-01 NOTE — Telephone Encounter (Signed)
Filled out and given to provider.

## 2017-02-01 NOTE — Telephone Encounter (Signed)
I called and left message on voicemail (per DPR) that FMLA ready and up front for pick up.

## 2017-02-09 ENCOUNTER — Encounter: Payer: Self-pay | Admitting: Adult Health

## 2017-02-14 ENCOUNTER — Encounter: Payer: Medicare Other | Admitting: Gastroenterology

## 2017-04-04 ENCOUNTER — Ambulatory Visit (AMBULATORY_SURGERY_CENTER): Payer: Medicare Other | Admitting: Gastroenterology

## 2017-04-04 ENCOUNTER — Encounter: Payer: Self-pay | Admitting: Gastroenterology

## 2017-04-04 VITALS — BP 125/80 | HR 63 | Temp 96.6°F | Resp 19 | Ht 60.0 in | Wt 137.0 lb

## 2017-04-04 DIAGNOSIS — Z1211 Encounter for screening for malignant neoplasm of colon: Secondary | ICD-10-CM

## 2017-04-04 DIAGNOSIS — D123 Benign neoplasm of transverse colon: Secondary | ICD-10-CM

## 2017-04-04 DIAGNOSIS — Z1212 Encounter for screening for malignant neoplasm of rectum: Secondary | ICD-10-CM | POA: Diagnosis not present

## 2017-04-04 DIAGNOSIS — L814 Other melanin hyperpigmentation: Secondary | ICD-10-CM | POA: Diagnosis not present

## 2017-04-04 MED ORDER — SODIUM CHLORIDE 0.9 % IV SOLN: 500.0000 mL | INTRAVENOUS | Status: DC

## 2017-04-04 NOTE — Progress Notes (Signed)
To PACU, VSS. Report to RN.tb 

## 2017-04-04 NOTE — Op Note (Signed)
[facility] Patient Name: Rachel Austin Procedure Date: 04/04/2017 11:46 AM MRN: [id number] Endoscopist: Mauri Pole , MD Age: 61 Referring MD:  Date of Birth: 01-30-1956 Gender: Female Account #: [id number] Procedure:                Colonoscopy Indications:              Screening for colorectal malignant neoplasm Medicines:                Monitored Anesthesia Care Procedure:                Pre-Anesthesia Assessment:                           - Prior to the procedure, a History and Physical                            was performed, and patient medications and                            allergies were reviewed. The patient's tolerance of                            previous anesthesia was also reviewed. The risks                            and benefits of the procedure and the sedation                            options and risks were discussed with the patient.                            All questions were answered, and informed consent                            was obtained. Prior Anticoagulants: The patient has                            taken no previous anticoagulant or antiplatelet                            agents. ASA Grade Assessment: II - A patient with                            mild systemic disease. After reviewing the risks                            and benefits, the patient was deemed in                            satisfactory condition to undergo the procedure.                           After obtaining informed consent, the colonoscope  was passed under direct vision. Throughout the                            procedure, the patient's blood pressure, pulse, and                            oxygen saturations were monitored continuously. The                            Colonoscope was introduced through the anus and                            advanced to the the cecum, identified by                            appendiceal orifice  and ileocecal valve. The                            colonoscopy was performed without difficulty. The                            patient tolerated the procedure well. The quality                            of the bowel preparation was good. The ileocecal                            valve, appendiceal orifice, and rectum were                            photographed. Scope In: 11:49:55 AM Scope Out: 57:32:20 PM Scope Withdrawal Time: 0 hours 12 minutes 3 seconds  Total Procedure Duration: 0 hours 16 minutes 16 seconds  Findings:                 The perianal and digital rectal examinations were                            normal.                           A 2 mm polyp was found in the transverse colon. The                            polyp was sessile. The polyp was removed with a                            cold biopsy forceps. Resection and retrieval were                            complete.                           A 5 mm polyp was found in the transverse colon. The  polyp was sessile. The polyp was removed with a                            cold snare. Resection and retrieval were complete.                           A diffuse area of severe melanosis was found in the                            entire colon.                           Non-bleeding internal hemorrhoids were found during                            retroflexion. The hemorrhoids were small. Complications:            No immediate complications. Estimated Blood Loss:     Estimated blood loss was minimal. Impression:               - One 2 mm polyp in the transverse colon, removed                            with a cold biopsy forceps. Resected and retrieved.                           - One 5 mm polyp in the transverse colon, removed                            with a cold snare. Resected and retrieved.                           - Melanosis in the colon.                           - Non-bleeding internal  hemorrhoids. Recommendation:           - Patient has a contact number available for                            emergencies. The signs and symptoms of potential                            delayed complications were discussed with the                            patient. Return to normal activities tomorrow.                            Written discharge instructions were provided to the                            patient.                           - Resume  previous diet.                           - Continue present medications.                           - Await pathology results.                           - Repeat colonoscopy in 5-10 years for surveillance                            based on pathology results. Mauri Pole, MD 04/04/2017 12:11:40 PM This report has been signed electronically.

## 2017-04-04 NOTE — Progress Notes (Signed)
Called to room to assist during endoscopic procedure.  Patient ID and intended procedure confirmed with present staff. Received instructions for my participation in the procedure from the performing physician.  

## 2017-04-04 NOTE — Patient Instructions (Signed)
YOU HAD AN ENDOSCOPIC PROCEDURE TODAY AT THE [facility]:   Refer to the procedure report that was given to you for any specific questions about what was found during the examination.  If the procedure report does not answer your questions, please call your gastroenterologist to clarify.  If you requested that your care partner not be given the details of your procedure findings, then the procedure report has been included in a sealed envelope for you to review at your convenience later.  YOU SHOULD EXPECT: Some feelings of bloating in the abdomen. Passage of more gas than usual.  Walking can help get rid of the air that was put into your GI tract during the procedure and reduce the bloating. If you had a lower endoscopy (such as a colonoscopy or flexible sigmoidoscopy) you may notice spotting of blood in your stool or on the toilet paper. If you underwent a bowel prep for your procedure, you may not have a normal bowel movement for a few days.  Please Note:  You might notice some irritation and congestion in your nose or some drainage.  This is from the oxygen used during your procedure.  There is no need for concern and it should clear up in a day or so.  SYMPTOMS TO REPORT IMMEDIATELY:   Following lower endoscopy (colonoscopy or flexible sigmoidoscopy):  Excessive amounts of blood in the stool  Significant tenderness or worsening of abdominal pains  Swelling of the abdomen that is new, acute  Fever of 100F or higher    For urgent or emergent issues, a gastroenterologist can be reached at any hour by calling [phone].   DIET:  We do recommend a small meal at first, but then you may proceed to your regular diet.  Drink plenty of fluids but you should avoid alcoholic beverages for 24 hours.  ACTIVITY:  You should plan to take it easy for the rest of today and you should NOT DRIVE or use heavy machinery until tomorrow (because of the sedation medicines used during the test).     FOLLOW UP: Our staff will call the number listed on your records the next business day following your procedure to check on you and address any questions or concerns that you may have regarding the information given to you following your procedure. If we do not reach you, we will leave a message.  However, if you are feeling well and you are not experiencing any problems, there is no need to return our call.  We will assume that you have returned to your regular daily activities without incident.  If any biopsies were taken you will be contacted by phone or by letter within the next 1-3 weeks.  Please call us at [phone] if you have not heard about the biopsies in 3 weeks.    SIGNATURES/CONFIDENTIALITY: You and/or your care partner have signed paperwork which will be entered into your electronic medical record.  These signatures attest to the fact that that the information above on your After Visit Summary has been reviewed and is understood.  Full responsibility of the confidentiality of this discharge information lies with you and/or your care-partner  Polyp and hemorrhoid information given.. 

## 2017-04-05 ENCOUNTER — Telehealth: Payer: Self-pay

## 2017-04-05 NOTE — Telephone Encounter (Signed)
  Follow up Call-  Call back number 04/04/2017  Post procedure Call Back phone  # [phone]  Permission to leave phone message Yes  Some recent data might be hidden     Patient questions:  Do you have a fever, pain , or abdominal swelling? No. Pain Score  0 *  Have you tolerated food without any problems? Yes.    Have you been able to return to your normal activities? Yes.    Do you have any questions about your discharge instructions: Diet   No. Medications  No. Follow up visit  No.  Do you have questions or concerns about your Care? No.  Actions: * If pain score is 4 or above: No action needed, pain <4.

## 2017-04-11 ENCOUNTER — Encounter: Payer: Self-pay | Admitting: Gastroenterology

## 2017-05-01 ENCOUNTER — Other Ambulatory Visit: Payer: Self-pay | Admitting: Adult Health

## 2017-05-01 DIAGNOSIS — F329 Major depressive disorder, single episode, unspecified: Secondary | ICD-10-CM

## 2017-05-01 DIAGNOSIS — I10 Essential (primary) hypertension: Secondary | ICD-10-CM

## 2017-05-01 DIAGNOSIS — F32A Depression, unspecified: Secondary | ICD-10-CM

## 2017-05-02 NOTE — Telephone Encounter (Signed)
Sent to the pharmacy by e-scribe. 

## 2017-08-24 ENCOUNTER — Other Ambulatory Visit: Payer: Self-pay | Admitting: Adult Health

## 2017-08-24 DIAGNOSIS — I10 Essential (primary) hypertension: Secondary | ICD-10-CM

## 2017-08-24 MED ORDER — BISOPROLOL-HYDROCHLOROTHIAZIDE 5-6.25 MG PO TABS: 1.0000 | ORAL_TABLET | Freq: Every day | ORAL | 0 refills | Status: DC

## 2017-08-28 ENCOUNTER — Ambulatory Visit: Payer: Medicare Other | Admitting: Adult Health

## 2017-08-29 ENCOUNTER — Encounter: Payer: Self-pay | Admitting: Adult Health

## 2017-08-29 ENCOUNTER — Ambulatory Visit (INDEPENDENT_AMBULATORY_CARE_PROVIDER_SITE_OTHER): Payer: Medicare Other | Admitting: Adult Health

## 2017-08-29 VITALS — BP 116/78 | Temp 98.2°F | Wt 136.0 lb

## 2017-08-29 DIAGNOSIS — M25561 Pain in right knee: Secondary | ICD-10-CM

## 2017-08-29 NOTE — Progress Notes (Signed)
Subjective:    Patient ID: Rachel Austin, female    DOB: 1956/02/12, 62 y.o.   MRN: [id number]  HPI  62 year old female who  has a past medical history of Anemia, Cataract (2018), Depression, Hyperlipidemia, Hypertension, and Rheumatoid arthritis ([place]).  Presents with a Spanish interpreter  She presents to the office today for the acute complaint of right knee pain.  Per patient she has had knee pain for approximately 1 month.  Pain radiates up the leg into the groin.  She was seen by her rheumatologist who would like her to be referred to orthopedics due to patient having prior right knee surgery in 2010.  She denies any redness, warmth, or swelling.  She does find it more painful when changing positions going from a sitting to a standing and with weightbearing.  He has been using Motrin which produces some relief.  She denies any trauma or falls  Review of Systems  See HPI   Past Medical History:  Diagnosis Date  . Anemia   . Cataract 2018   small, bilateral  . Depression   . Hyperlipidemia   . Hypertension   . Rheumatoid arthritis ([place])    on meds    Social History   Socioeconomic History  . Marital status: Single    Spouse name: Not on file  . Number of children: Not on file  . Years of education: Not on file  . Highest education level: Not on file  Occupational History  . Not on file  Social Needs  . Financial resource strain: Not on file  . Food insecurity:    Worry: Not on file    Inability: Not on file  . Transportation needs:    Medical: Not on file    Non-medical: Not on file  Tobacco Use  . Smoking status: Never Smoker  . Smokeless tobacco: Never Used  Substance and Sexual Activity  . Alcohol use: No    Alcohol/week: 0.0 oz  . Drug use: No  . Sexual activity: Never  Lifestyle  . Physical activity:    Days per week: Not on file    Minutes per session: Not on file  . Stress: Not on file  Relationships  . Social connections:    Talks on phone: Not  on file    Gets together: Not on file    Attends religious service: Not on file    Active member of club or organization: Not on file    Attends meetings of clubs or organizations: Not on file    Relationship status: Not on file  . Intimate partner violence:    Fear of current or ex partner: Not on file    Emotionally abused: Not on file    Physically abused: Not on file    Forced sexual activity: Not on file  Other Topics Concern  . Not on file  Social History Narrative   Moved from [place] to [facility] in December    Past Surgical History:  Procedure Laterality Date  . FINGER SURGERY  01/29/2017   last 2 fingers left hand  . FOOT SURGERY  2016   Bil/ foot reconstruction due to RA  . KNEE SURGERY Bilateral    Right knee-2010, Left knee -2006  . VARICOSE VEIN SURGERY  2015   right     Family History  Problem Relation Age of Onset  . Heart disease Mother        pacemaker, hypertensin    No Known  Allergies  Current Outpatient Medications on File Prior to Visit  Medication Sig Dispense Refill  . Adalimumab 40 MG/0.8ML PSKT Inject into the skin. Inject 1 dose subq every 2 weeks or as directed.    . bisoprolol-hydrochlorothiazide (ZIAC) 5-6.25 MG tablet Take 1 tablet by mouth daily. 90 tablet 0  . CALCIUM PO Take by mouth. Calcium with Vit d 3 - 500 mg/2000-Take one tablet bid    . citalopram (CELEXA) 20 MG tablet Take 1 tablet (20 mg total) by mouth daily. 90 tablet 1  . citalopram (CELEXA) 20 MG tablet TAKE ONE TABLET BY MOUTH ONCE DAILY 90 tablet 1  . Ferrous Sulfate Dried (SLOW RELEASE IRON) 45 MG TBCR Take by mouth daily.    Marland Kitchen ibuprofen (ADVIL,MOTRIN) 200 MG tablet Take 200 mg by mouth every 6 (six) hours as needed.    . methotrexate (RHEUMATREX) 2.5 MG tablet Take 2.5 mg by mouth once a week. Tale 6 pills by mouth every week.   Caution:Chemotherapy. Protect from light.    . Multiple Vitamins-Minerals (CENTRUM SILVER ULTRA WOMENS PO) Take by mouth.    . oxyCODONE-acetaminophen  (PERCOCET/ROXICET) 5-325 MG tablet Take by mouth every 4 (four) hours as needed for severe pain.     No current facility-administered medications on file prior to visit.     BP 116/78   Temp 98.2 F (36.8 C) (Oral)   Wt 136 lb (61.7 kg)   BMI 26.56 kg/m        Objective:   Physical Exam  Constitutional: She is oriented to person, place, and time. She appears well-developed and well-nourished. No distress.  Cardiovascular: Normal rate, regular rhythm, normal heart sounds and intact distal pulses. Exam reveals no gallop and no friction rub.  No murmur heard. Pulmonary/Chest: Effort normal and breath sounds normal. No respiratory distress. She has no wheezes. She has no rales. She exhibits no tenderness.  Musculoskeletal:  With palpation along entire knee.  Knee does appear stable.  There is no swelling, redness, or warmth.  Does have a prior surgical scar on this knee.  Walks with limping gait  Neurological: She is alert and oriented to person, place, and time.  Skin: Skin is warm and dry. No rash noted. She is not diaphoretic. No erythema. No pallor.  Psychiatric: She has a normal mood and affect. Her behavior is normal. Judgment and thought content normal.  Nursing note and vitals reviewed.     Assessment & Plan:

## 2017-08-30 ENCOUNTER — Ambulatory Visit: Payer: Medicare Other | Admitting: Adult Health

## 2017-10-25 ENCOUNTER — Encounter: Payer: Self-pay | Admitting: Adult Health

## 2017-10-25 ENCOUNTER — Ambulatory Visit (INDEPENDENT_AMBULATORY_CARE_PROVIDER_SITE_OTHER): Payer: Medicare Other | Admitting: Adult Health

## 2017-10-25 VITALS — BP 118/84 | Temp 98.4°F | Wt 134.0 lb

## 2017-10-25 DIAGNOSIS — M25552 Pain in left hip: Secondary | ICD-10-CM | POA: Diagnosis not present

## 2017-10-25 DIAGNOSIS — M25551 Pain in right hip: Secondary | ICD-10-CM | POA: Diagnosis not present

## 2017-10-25 MED ORDER — NAPROXEN 500 MG PO TABS: 500.0000 mg | ORAL_TABLET | Freq: Two times a day (BID) | ORAL | 0 refills | Status: DC

## 2017-10-25 NOTE — Progress Notes (Signed)
Subjective:    Patient ID: Rachel Austin, female    DOB: 16-Jul-1955, 62 y.o.   MRN: [id number]  HPI 62 year old female who  has a past medical history of Anemia, Cataract (2018), Depression, Hyperlipidemia, Hypertension, and Rheumatoid arthritis ([place]).  She is with a spanish interpreter today   She presents to the office today for pain management.second opinion. She was seen by Dr. Gladstone Lighter for chronic bilateral hip and lumbar pain since February after she sustained a fall in [place]. Per patient report she had xrays done and the orthopedic surgeon wanted her to " keep her hip as long as possible". She and her family would like a second opinion due to decreased quality of life.   Review of Systems See HPI   Past Medical History:  Diagnosis Date  . Anemia   . Cataract 2018   small, bilateral  . Depression   . Hyperlipidemia   . Hypertension   . Rheumatoid arthritis ([place])    on meds    Social History   Socioeconomic History  . Marital status: Single    Spouse name: Not on file  . Number of children: Not on file  . Years of education: Not on file  . Highest education level: Not on file  Occupational History  . Not on file  Social Needs  . Financial resource strain: Not on file  . Food insecurity:    Worry: Not on file    Inability: Not on file  . Transportation needs:    Medical: Not on file    Non-medical: Not on file  Tobacco Use  . Smoking status: Never Smoker  . Smokeless tobacco: Never Used  Substance and Sexual Activity  . Alcohol use: No    Alcohol/week: 0.0 oz  . Drug use: No  . Sexual activity: Never  Lifestyle  . Physical activity:    Days per week: Not on file    Minutes per session: Not on file  . Stress: Not on file  Relationships  . Social connections:    Talks on phone: Not on file    Gets together: Not on file    Attends religious service: Not on file    Active member of club or organization: Not on file    Attends meetings of clubs or  organizations: Not on file    Relationship status: Not on file  . Intimate partner violence:    Fear of current or ex partner: Not on file    Emotionally abused: Not on file    Physically abused: Not on file    Forced sexual activity: Not on file  Other Topics Concern  . Not on file  Social History Narrative   Moved from [place] to [facility] in December    Past Surgical History:  Procedure Laterality Date  . FINGER SURGERY  01/29/2017   last 2 fingers left hand  . FOOT SURGERY  2016   Bil/ foot reconstruction due to RA  . KNEE SURGERY Bilateral    Right knee-2010, Left knee -2006  . VARICOSE VEIN SURGERY  2015   right     Family History  Problem Relation Age of Onset  . Heart disease Mother        pacemaker, hypertensin    No Known Allergies  Current Outpatient Medications on File Prior to Visit  Medication Sig Dispense Refill  . Adalimumab 40 MG/0.8ML PSKT Inject into the skin. Inject 1 dose subq every 2 weeks or as directed.    Marland Kitchen  bisoprolol-hydrochlorothiazide (ZIAC) 5-6.25 MG tablet Take 1 tablet by mouth daily. 90 tablet 0  . CALCIUM PO Take by mouth. Calcium with Vit d 3 - 500 mg/2000-Take one tablet bid    . citalopram (CELEXA) 20 MG tablet Take 1 tablet (20 mg total) by mouth daily. 90 tablet 1  . citalopram (CELEXA) 20 MG tablet TAKE ONE TABLET BY MOUTH ONCE DAILY 90 tablet 1  . Ferrous Sulfate Dried (SLOW RELEASE IRON) 45 MG TBCR Take by mouth daily.    . methotrexate (RHEUMATREX) 2.5 MG tablet Take 2.5 mg by mouth once a week. Tale 6 pills by mouth every week.   Caution:Chemotherapy. Protect from light.    . Multiple Vitamins-Minerals (CENTRUM SILVER ULTRA WOMENS PO) Take by mouth.     No current facility-administered medications on file prior to visit.     BP 118/84   Temp 98.4 F (36.9 C) (Oral)   Wt 134 lb (60.8 kg)   BMI 26.17 kg/m       Objective:   Physical Exam  Constitutional: She is oriented to person, place, and time. She appears well-developed and  well-nourished. No distress.  Cardiovascular: Normal rate and regular rhythm.  Pulmonary/Chest: Effort normal and breath sounds normal.  Musculoskeletal:  Walks with slow limping gait   Neurological: She is alert and oriented to person, place, and time.  Skin: Skin is warm and dry. She is not diaphoretic.  Psychiatric: She has a normal mood and affect. Her behavior is normal. Judgment and thought content normal.  Vitals reviewed.     Assessment & Plan:  1. Bilateral hip pain - Will refer to MW for further evaluation. Will also prescribe Naprosyn, advised not to take any other NSAIDS with this medication  - naproxen (NAPROSYN) 500 MG tablet; Take 1 tablet (500 mg total) by mouth 2 (two) times daily with a meal.  Dispense: 180 tablet; Refill: 0 - AMB referral to orthopedics  Dorothyann Peng, NP

## 2017-11-09 ENCOUNTER — Encounter: Payer: Medicare Other | Admitting: Adult Health

## 2017-11-19 LAB — CBC AND DIFFERENTIAL
HEMATOCRIT: 34 — AB (ref 36–46)
Hemoglobin: 10.4 — AB (ref 12.0–16.0)
Platelets: 388 (ref 150–399)
WBC: 6.8

## 2017-11-19 LAB — BASIC METABOLIC PANEL
BUN: 16 (ref 4–21)
Creatinine: 0.6 (ref 0.5–1.1)
Glucose: 87
POTASSIUM: 4.5 (ref 3.4–5.3)
SODIUM: 139 (ref 137–147)

## 2017-11-19 LAB — POCT ERYTHROCYTE SEDIMENTATION RATE, NON-AUTOMATED: Sed Rate: 83

## 2017-11-19 LAB — HEPATIC FUNCTION PANEL
ALT: 11 (ref 7–35)
AST: 17 (ref 13–35)
Alkaline Phosphatase: 93 (ref 25–125)
Bilirubin, Total: 0.2

## 2017-12-04 ENCOUNTER — Encounter: Payer: Self-pay | Admitting: Family Medicine

## 2018-01-07 ENCOUNTER — Other Ambulatory Visit: Payer: Self-pay | Admitting: Adult Health

## 2018-01-07 DIAGNOSIS — M25552 Pain in left hip: Principal | ICD-10-CM

## 2018-01-07 DIAGNOSIS — M25551 Pain in right hip: Secondary | ICD-10-CM

## 2018-01-31 ENCOUNTER — Ambulatory Visit (INDEPENDENT_AMBULATORY_CARE_PROVIDER_SITE_OTHER): Payer: Medicare Other | Admitting: Adult Health

## 2018-01-31 ENCOUNTER — Encounter: Payer: Self-pay | Admitting: Adult Health

## 2018-01-31 VITALS — BP 100/60 | Temp 98.0°F | Ht 59.5 in | Wt 133.0 lb

## 2018-01-31 DIAGNOSIS — F329 Major depressive disorder, single episode, unspecified: Secondary | ICD-10-CM | POA: Diagnosis not present

## 2018-01-31 DIAGNOSIS — I10 Essential (primary) hypertension: Secondary | ICD-10-CM

## 2018-01-31 DIAGNOSIS — E782 Mixed hyperlipidemia: Secondary | ICD-10-CM

## 2018-01-31 DIAGNOSIS — Z0000 Encounter for general adult medical examination without abnormal findings: Secondary | ICD-10-CM | POA: Diagnosis not present

## 2018-01-31 DIAGNOSIS — Z23 Encounter for immunization: Secondary | ICD-10-CM

## 2018-01-31 LAB — LIPID PANEL
CHOL/HDL RATIO: 3
Cholesterol: 171 mg/dL (ref 0–200)
HDL: 50.4 mg/dL (ref >39.00)
LDL Cholesterol: 99 mg/dL (ref 0–99)
NONHDL: 121.09
TRIGLYCERIDES: 111 mg/dL (ref 0.0–149.0)
VLDL: 22.2 mg/dL (ref 0.0–40.0)

## 2018-01-31 LAB — TSH: TSH: 1.83 u[IU]/mL (ref 0.35–4.50)

## 2018-01-31 LAB — CBC WITH DIFFERENTIAL/PLATELET
BASOS ABS: 0 10*3/uL (ref 0.0–0.1)
BASOS PCT: 0.6 % (ref 0.0–3.0)
EOS ABS: 0.1 10*3/uL (ref 0.0–0.7)
Eosinophils Relative: 1.8 % (ref 0.0–5.0)
HEMATOCRIT: 32.8 % — AB (ref 36.0–46.0)
Hemoglobin: 10.7 g/dL — ABNORMAL LOW (ref 12.0–15.0)
LYMPHS ABS: 2.9 10*3/uL (ref 0.7–4.0)
Lymphocytes Relative: 45.1 % (ref 12.0–46.0)
MCHC: 32.6 g/dL (ref 30.0–36.0)
MCV: 86.2 fl (ref 78.0–100.0)
MONO ABS: 0.4 10*3/uL (ref 0.1–1.0)
Monocytes Relative: 6.7 % (ref 3.0–12.0)
NEUTROS PCT: 45.8 % (ref 43.0–77.0)
Neutro Abs: 3 10*3/uL (ref 1.4–7.7)
PLATELETS: 412 10*3/uL — AB (ref 150.0–400.0)
RBC: 3.81 Mil/uL — ABNORMAL LOW (ref 3.87–5.11)
RDW: 15.9 % — AB (ref 11.5–15.5)
WBC: 6.5 10*3/uL (ref 4.0–10.5)

## 2018-01-31 LAB — HEPATIC FUNCTION PANEL
ALK PHOS: 85 U/L (ref 39–117)
ALT: 8 U/L (ref 0–35)
AST: 14 U/L (ref 0–37)
Albumin: 3.6 g/dL (ref 3.5–5.2)
BILIRUBIN DIRECT: 0.1 mg/dL (ref 0.0–0.3)
TOTAL PROTEIN: 7 g/dL (ref 6.0–8.3)
Total Bilirubin: 0.4 mg/dL (ref 0.2–1.2)

## 2018-01-31 LAB — BASIC METABOLIC PANEL
BUN: 14 mg/dL (ref 6–23)
CO2: 26 mEq/L (ref 19–32)
Calcium: 9.8 mg/dL (ref 8.4–10.5)
Chloride: 103 mEq/L (ref 96–112)
Creatinine, Ser: 0.57 mg/dL (ref 0.40–1.20)
GFR: 114.46 mL/min (ref >60.00)
GLUCOSE: 125 mg/dL — AB (ref 70–99)
Potassium: 3.7 mEq/L (ref 3.5–5.1)
Sodium: 138 mEq/L (ref 135–145)

## 2018-01-31 MED ORDER — BISOPROLOL-HYDROCHLOROTHIAZIDE 5-6.25 MG PO TABS: 1.0000 | ORAL_TABLET | Freq: Every day | ORAL | 3 refills | Status: DC

## 2018-01-31 NOTE — Patient Instructions (Signed)
It was great seeing you today   I will follow up with you regarding your blood work   Please let me know if you need anything

## 2018-01-31 NOTE — Progress Notes (Signed)
Subjective:    Patient ID: Rachel Austin, female    DOB: December 03, 1955, 62 y.o.   MRN: [id number]  HPI Patient presents for yearly preventative medicine examination. She is a pleasant 62 year old female who  has a past medical history of Anemia, Cataract (2018), Depression, Hyperlipidemia, Hypertension, and Rheumatoid arthritis ([place]).  Essential Hypertension - Takes Ziac 5-6.25 mg.- well controlled.  BP Readings from Last 3 Encounters:  01/31/18 100/60  10/25/17 118/84  08/29/17 116/78   Depression - Takes Celexa  20 mg - feels controlled.   RA - followed closely by Rheumatology. Prescribed Methotrexate. Has follow up in October.   Right hip pain - has steroid shot which helped. If thinking about having a hip replacement.   Hyperlipidemia - not currently on any medications  Lab Results  Component Value Date   CHOL 192 11/08/2016   HDL 50.40 11/08/2016   LDLCALC 118 (H) 11/08/2016   TRIG 117.0 11/08/2016   CHOLHDL 4 11/08/2016     All immunizations and health maintenance protocols were reviewed with the patient and needed orders were placed. She is due for influenza vaccination   Appropriate screening laboratory values were ordered for the patient including screening of hyperlipidemia, renal function and hepatic function.  Medication reconciliation,  past medical history, social history, problem list and allergies were reviewed in detail with the patient  Goals were established with regard to weight loss, exercise, and  diet in compliance with medications. She eats a heart healthy diet.  Lab Results  Component Value Date   CHOL 192 11/08/2016   HDL 50.40 11/08/2016   LDLCALC 118 (H) 11/08/2016   TRIG 117.0 11/08/2016   CHOLHDL 4 11/08/2016    She is up to date on routine colonoscopy. She has been seen by GYN and had pap and mammogram ( I do not have these records).    Review of Systems  Constitutional: Negative.   HENT: Negative.   Eyes: Negative.   Respiratory:  Negative.   Cardiovascular: Negative.   Gastrointestinal: Negative.   Endocrine: Negative.   Genitourinary: Negative.   Musculoskeletal: Positive for arthralgias and gait problem.  Skin: Negative.   Allergic/Immunologic: Negative.   Hematological: Negative.   Psychiatric/Behavioral: Negative.    Past Medical History:  Diagnosis Date  . Anemia   . Cataract 2018   small, bilateral  . Depression   . Hyperlipidemia   . Hypertension   . Rheumatoid arthritis ([place])    on meds    Social History   Socioeconomic History  . Marital status: Single    Spouse name: Not on file  . Number of children: Not on file  . Years of education: Not on file  . Highest education level: Not on file  Occupational History  . Not on file  Social Needs  . Financial resource strain: Not on file  . Food insecurity:    Worry: Not on file    Inability: Not on file  . Transportation needs:    Medical: Not on file    Non-medical: Not on file  Tobacco Use  . Smoking status: Never Smoker  . Smokeless tobacco: Never Used  Substance and Sexual Activity  . Alcohol use: No    Alcohol/week: 0.0 standard drinks  . Drug use: No  . Sexual activity: Never  Lifestyle  . Physical activity:    Days per week: Not on file    Minutes per session: Not on file  . Stress: Not on file  Relationships  .  Social connections:    Talks on phone: Not on file    Gets together: Not on file    Attends religious service: Not on file    Active member of club or organization: Not on file    Attends meetings of clubs or organizations: Not on file    Relationship status: Not on file  . Intimate partner violence:    Fear of current or ex partner: Not on file    Emotionally abused: Not on file    Physically abused: Not on file    Forced sexual activity: Not on file  Other Topics Concern  . Not on file  Social History Narrative   Moved from [place] to [facility] in December    Past Surgical History:  Procedure Laterality Date  .  FINGER SURGERY  01/29/2017   last 2 fingers left hand  . FOOT SURGERY  2016   Bil/ foot reconstruction due to RA  . KNEE SURGERY Bilateral    Right knee-2010, Left knee -2006  . VARICOSE VEIN SURGERY  2015   right     Family History  Problem Relation Age of Onset  . Heart disease Mother        pacemaker, hypertensin    No Known Allergies  Current Outpatient Medications on File Prior to Visit  Medication Sig Dispense Refill  . Adalimumab 40 MG/0.8ML PSKT Inject into the skin. Inject 1 dose subq every 2 weeks or as directed.    . bisoprolol-hydrochlorothiazide (ZIAC) 5-6.25 MG tablet Take 1 tablet by mouth daily. 90 tablet 0  . CALCIUM PO Take by mouth. Calcium with Vit d 3 - 500 mg/2000-Take one tablet bid    . citalopram (CELEXA) 20 MG tablet Take 1 tablet (20 mg total) by mouth daily. 90 tablet 1  . citalopram (CELEXA) 20 MG tablet TAKE ONE TABLET BY MOUTH ONCE DAILY 90 tablet 1  . Ferrous Sulfate Dried (SLOW RELEASE IRON) 45 MG TBCR Take by mouth daily.    . methotrexate (RHEUMATREX) 2.5 MG tablet Take 2.5 mg by mouth once a week. Tale 6 pills by mouth every week.   Caution:Chemotherapy. Protect from light.    . Multiple Vitamins-Minerals (CENTRUM SILVER ULTRA WOMENS PO) Take by mouth.     No current facility-administered medications on file prior to visit.     BP 100/60   Temp 98 F (36.7 C) (Oral)   Ht 4' 11.5" (1.511 m)   Wt 133 lb (60.3 kg)   BMI 26.41 kg/m       Objective:   Physical Exam  Constitutional: She is oriented to person, place, and time. She appears well-developed and well-nourished. No distress.  HENT:  Head: Normocephalic and atraumatic.  Right Ear: External ear normal.  Left Ear: External ear normal.  Nose: Nose normal.  Mouth/Throat: Oropharynx is clear and moist. No oropharyngeal exudate.  Eyes: Pupils are equal, round, and reactive to light. Conjunctivae and EOM are normal. Right eye exhibits no discharge. Left eye exhibits no discharge. No  scleral icterus.  Neck: Normal range of motion. Neck supple. No JVD present. No tracheal deviation present. No thyromegaly present.  Cardiovascular: Normal rate, regular rhythm, normal heart sounds and intact distal pulses. Exam reveals no gallop and no friction rub.  No murmur heard. Pulmonary/Chest: Effort normal and breath sounds normal. No stridor. No respiratory distress. She has no wheezes. She has no rales. She exhibits no tenderness.  Abdominal: Soft. Bowel sounds are normal. She exhibits no distension and no  mass. There is no tenderness. There is no rebound and no guarding. No hernia.  Genitourinary:  Genitourinary Comments: Refused. Done by GYN   Musculoskeletal: Normal range of motion. She exhibits deformity (bilateral hands ). She exhibits no edema or tenderness.  Slow steady gait with cane   Lymphadenopathy:    She has no cervical adenopathy.  Neurological: She is alert and oriented to person, place, and time. She displays normal reflexes. No cranial nerve deficit or sensory deficit. She exhibits normal muscle tone. Coordination normal.  Skin: Skin is warm and dry. Capillary refill takes less than 2 seconds. No rash noted. She is not diaphoretic. No erythema. No pallor.  Psychiatric: She has a normal mood and affect. Her behavior is normal. Judgment and thought content normal.  Nursing note and vitals reviewed.     Assessment & Plan:   1. Routine general medical examination at a health care facility - One year follow up unless needed for acute issues  - Basic metabolic panel - CBC with Differential/Platelet - Hepatic function panel - Lipid panel - TSH  2. Essential hypertension - Well controlled. No change  - bisoprolol-hydrochlorothiazide (ZIAC) 5-6.25 MG tablet; Take 1 tablet by mouth daily.  Dispense: 90 tablet; Refill: 3 - Basic metabolic panel - CBC with Differential/Platelet - Hepatic function panel - Lipid panel - TSH  3. Mixed hyperlipidemia - Consider  statin  - Basic metabolic panel - CBC with Differential/Platelet - Hepatic function panel - Lipid panel - TSH  4. Reactive depression - Well controlled.  - Continue with Celexa   5. Need for influenza vaccination  - Flu Vaccine QUAD 6+ mos PF IM (Fluarix Quad PF)  Dorothyann Peng, NP

## 2018-02-01 ENCOUNTER — Other Ambulatory Visit: Payer: Self-pay | Admitting: Adult Health

## 2018-02-01 ENCOUNTER — Other Ambulatory Visit (INDEPENDENT_AMBULATORY_CARE_PROVIDER_SITE_OTHER): Payer: Medicare Other

## 2018-02-01 DIAGNOSIS — R7309 Other abnormal glucose: Secondary | ICD-10-CM

## 2018-02-01 LAB — HEMOGLOBIN A1C: HEMOGLOBIN A1C: 5.6 % (ref 4.6–6.5)

## 2018-02-13 ENCOUNTER — Other Ambulatory Visit: Payer: Self-pay | Admitting: Adult Health

## 2018-02-13 DIAGNOSIS — M25551 Pain in right hip: Secondary | ICD-10-CM

## 2018-02-13 DIAGNOSIS — M25552 Pain in left hip: Principal | ICD-10-CM

## 2018-04-30 ENCOUNTER — Ambulatory Visit: Payer: Medicare Other | Admitting: Adult Health

## 2018-05-01 ENCOUNTER — Encounter: Payer: Self-pay | Admitting: Adult Health

## 2018-05-01 ENCOUNTER — Ambulatory Visit (INDEPENDENT_AMBULATORY_CARE_PROVIDER_SITE_OTHER): Payer: Medicare Other

## 2018-05-01 ENCOUNTER — Ambulatory Visit (INDEPENDENT_AMBULATORY_CARE_PROVIDER_SITE_OTHER): Payer: Medicare Other | Admitting: Adult Health

## 2018-05-01 VITALS — BP 106/62 | Temp 98.2°F | Wt 130.0 lb

## 2018-05-01 DIAGNOSIS — Z01818 Encounter for other preprocedural examination: Secondary | ICD-10-CM

## 2018-05-01 LAB — BASIC METABOLIC PANEL
BUN: 17 mg/dL (ref 6–23)
CALCIUM: 10 mg/dL (ref 8.4–10.5)
CO2: 30 meq/L (ref 19–32)
Chloride: 103 mEq/L (ref 96–112)
Creatinine, Ser: 0.49 mg/dL (ref 0.40–1.20)
GFR: 136.18 mL/min (ref >60.00)
Glucose, Bld: 81 mg/dL (ref 70–99)
POTASSIUM: 4 meq/L (ref 3.5–5.1)
Sodium: 138 mEq/L (ref 135–145)

## 2018-05-01 LAB — CBC WITH DIFFERENTIAL/PLATELET
BASOS ABS: 0 10*3/uL (ref 0.0–0.1)
Basophils Relative: 0.5 % (ref 0.0–3.0)
EOS ABS: 0.1 10*3/uL (ref 0.0–0.7)
Eosinophils Relative: 1.7 % (ref 0.0–5.0)
HCT: 31.5 % — ABNORMAL LOW (ref 36.0–46.0)
Hemoglobin: 10.1 g/dL — ABNORMAL LOW (ref 12.0–15.0)
Lymphocytes Relative: 43.1 % (ref 12.0–46.0)
Lymphs Abs: 3.1 10*3/uL (ref 0.7–4.0)
MCHC: 31.9 g/dL (ref 30.0–36.0)
MCV: 83.4 fl (ref 78.0–100.0)
MONO ABS: 0.6 10*3/uL (ref 0.1–1.0)
MONOS PCT: 8.2 % (ref 3.0–12.0)
Neutro Abs: 3.4 10*3/uL (ref 1.4–7.7)
Neutrophils Relative %: 46.5 % (ref 43.0–77.0)
Platelets: 400 10*3/uL (ref 150.0–400.0)
RBC: 3.78 Mil/uL — AB (ref 3.87–5.11)
RDW: 15.6 % — AB (ref 11.5–15.5)
WBC: 7.3 10*3/uL (ref 4.0–10.5)

## 2018-05-01 LAB — PROTIME-INR
INR: 1.1 ratio — ABNORMAL HIGH (ref 0.8–1.0)
Prothrombin Time: 12.9 s (ref 9.6–13.1)

## 2018-05-01 NOTE — Progress Notes (Signed)
Subjective:    Patient ID: Rachel Austin, female    DOB: 1956-02-06, 62 y.o.   MRN: [id number]  HPI  62 year old female who  has a past medical history of Anemia, Cataract (2018), Depression, Hyperlipidemia, Hypertension, and Rheumatoid arthritis ([place]).  She presents to the office today for pre surgical clearance for right total hip replacement. Dr. [name] will be the surgeon. Tentative date of the surgery is 05/31/2017.   Review of Systems See HPI   Past Medical History:  Diagnosis Date  . Anemia   . Cataract 2018   small, bilateral  . Depression   . Hyperlipidemia   . Hypertension   . Rheumatoid arthritis ([place])    on meds    Social History   Socioeconomic History  . Marital status: Single    Spouse name: Not on file  . Number of children: Not on file  . Years of education: Not on file  . Highest education level: Not on file  Occupational History  . Not on file  Social Needs  . Financial resource strain: Not on file  . Food insecurity:    Worry: Not on file    Inability: Not on file  . Transportation needs:    Medical: Not on file    Non-medical: Not on file  Tobacco Use  . Smoking status: Never Smoker  . Smokeless tobacco: Never Used  Substance and Sexual Activity  . Alcohol use: No    Alcohol/week: 0.0 standard drinks  . Drug use: No  . Sexual activity: Never  Lifestyle  . Physical activity:    Days per week: Not on file    Minutes per session: Not on file  . Stress: Not on file  Relationships  . Social connections:    Talks on phone: Not on file    Gets together: Not on file    Attends religious service: Not on file    Active member of club or organization: Not on file    Attends meetings of clubs or organizations: Not on file    Relationship status: Not on file  . Intimate partner violence:    Fear of current or ex partner: Not on file    Emotionally abused: Not on file    Physically abused: Not on file    Forced sexual activity: Not  on file  Other Topics Concern  . Not on file  Social History Narrative   Moved from [place] to [facility] in December    Past Surgical History:  Procedure Laterality Date  . FINGER SURGERY  01/29/2017   last 2 fingers left hand  . FOOT SURGERY  2016   Bil/ foot reconstruction due to RA  . KNEE SURGERY Bilateral    Right knee-2010, Left knee -2006  . VARICOSE VEIN SURGERY  2015   right     Family History  Problem Relation Age of Onset  . Heart disease Mother        pacemaker, hypertensin    No Known Allergies  Current Outpatient Medications on File Prior to Visit  Medication Sig Dispense Refill  . bisoprolol-hydrochlorothiazide (ZIAC) 5-6.25 MG tablet Take 1 tablet by mouth daily. 90 tablet 3  . CALCIUM PO Take by mouth. Calcium with Vit d 3 - 500 mg/2000-Take one tablet bid    . citalopram (CELEXA) 20 MG tablet Take 1 tablet (20 mg total) by mouth daily. 90 tablet 1  . citalopram (CELEXA) 20 MG tablet TAKE ONE TABLET BY MOUTH  ONCE DAILY 90 tablet 1  . Ferrous Sulfate Dried (SLOW RELEASE IRON) 45 MG TBCR Take by mouth daily.    . methotrexate (RHEUMATREX) 2.5 MG tablet Take 2.5 mg by mouth once a week. Tale 6 pills by mouth every week.   Caution:Chemotherapy. Protect from light.    . Multiple Vitamins-Minerals (CENTRUM SILVER ULTRA WOMENS PO) Take by mouth.    . naproxen (NAPROSYN) 500 MG tablet TAKE 1 TABLET BY MOUTH TWICE DAILY WITH MEAL 180 tablet 0  . Adalimumab 40 MG/0.8ML PSKT Inject into the skin. Inject 1 dose subq every 2 weeks or as directed.     No current facility-administered medications on file prior to visit.     BP 106/62   Temp 98.2 F (36.8 C)   Wt 130 lb (59 kg)   BMI 25.82 kg/m       Objective:   Physical Exam  Constitutional: She is oriented to person, place, and time. She appears well-developed and well-nourished. No distress.  Cardiovascular: Regular rhythm, normal heart sounds and intact distal pulses. Bradycardia present. Exam reveals no gallop and  no friction rub.  No murmur heard. Pulmonary/Chest: Effort normal and breath sounds normal. No stridor. No respiratory distress. She has no wheezes. She has no rales. She exhibits no tenderness.  Abdominal: Soft. Bowel sounds are normal.  Neurological: She is alert and oriented to person, place, and time.  Skin: Skin is warm and dry. Capillary refill takes less than 2 seconds. She is not diaphoretic.  Psychiatric: She has a normal mood and affect. Her behavior is normal. Judgment and thought content normal.  Nursing note and vitals reviewed.     Assessment & Plan:  1. Pre-operative clearance - EKG 12-Lead- Normal sinus bradycardia, rate 51. EKG shows Inferior ST elevation. This appears to be repolarization. Reviewed with Dr. Elease Hashimoto who agreed.  - CBC with Differential/Platelet - Basic Metabolic Panel - DG Chest 2 View; Future - Protime-INR  Dorothyann Peng, NP

## 2018-05-01 NOTE — Addendum Note (Signed)
Addended by: Gwynne Edinger on: 05/01/2018 03:10 PM   Modules accepted: Orders

## 2018-05-01 NOTE — Patient Instructions (Signed)
It was great seeing you today   Once your blood work and chest xray has resulted we will fax your paper work to Dr. French Ana   Please stop Naprosyn one week prior to surgery. You can take Tylenol as needed for pain.

## 2018-05-16 ENCOUNTER — Ambulatory Visit: Payer: Self-pay | Admitting: Physician Assistant

## 2018-05-16 NOTE — H&P (Signed)
TOTAL HIP ADMISSION H&P  Patient is admitted for right total hip arthroplasty.  Subjective:  Chief Complaint: right hip pain  HPI: Rachel Austin, 62 y.o. female, has a history of pain and functional disability in the right hip(s) due to arthritis and patient has failed non-surgical conservative treatments for greater than 12 weeks to include NSAID's and/or analgesics, corticosteriod injections, use of assistive devices and activity modification.  Onset of symptoms was gradual starting >10 years ago with gradually worsening course since that time.The patient noted no past surgery on the right hip(s).  Patient currently rates pain in the right hip at 10 out of 10 with activity. Patient has night pain, worsening of pain with activity and weight bearing, trendelenberg gait, pain that interfers with activities of daily living and pain with passive range of motion. Patient has evidence of subchondral cysts, periarticular osteophytes and joint space narrowing by imaging studies. This condition presents safety issues increasing the risk of falls. There is no current active infection.  Patient Active Problem List   Diagnosis Date Noted  . Hyperlipidemia 03/01/2015  . Essential hypertension 03/01/2015  . Depression 03/01/2015  . Rheumatoid arthritis ([place]) 07/16/2014   Past Medical History:  Diagnosis Date  . Anemia   . Cataract 2018   small, bilateral  . Depression   . Hyperlipidemia   . Hypertension   . Rheumatoid arthritis ([place])    on meds    Past Surgical History:  Procedure Laterality Date  . FINGER SURGERY  01/29/2017   last 2 fingers left hand  . FOOT SURGERY  2016   Bil/ foot reconstruction due to RA  . KNEE SURGERY Bilateral    Right knee-2010, Left knee -2006  . VARICOSE VEIN SURGERY  2015   right     Current Outpatient Medications  Medication Sig Dispense Refill Last Dose  . Adalimumab 40 MG/0.8ML PSKT Inject into the skin. Inject 1 dose subq every 2 weeks or as directed.    Not Taking  . bisoprolol-hydrochlorothiazide (ZIAC) 5-6.25 MG tablet Take 1 tablet by mouth daily. 90 tablet 3 Taking  . CALCIUM PO Take by mouth. Calcium with Vit d 3 - 500 mg/2000-Take one tablet bid   Taking  . citalopram (CELEXA) 20 MG tablet Take 1 tablet (20 mg total) by mouth daily. 90 tablet 1 Taking  . citalopram (CELEXA) 20 MG tablet TAKE ONE TABLET BY MOUTH ONCE DAILY 90 tablet 1 Taking  . Ferrous Sulfate Dried (SLOW RELEASE IRON) 45 MG TBCR Take by mouth daily.   Taking  . methotrexate (RHEUMATREX) 2.5 MG tablet Take 2.5 mg by mouth once a week. Tale 6 pills by mouth every week.   Caution:Chemotherapy. Protect from light.   Taking  . Multiple Vitamins-Minerals (CENTRUM SILVER ULTRA WOMENS PO) Take by mouth.   Taking  . naproxen (NAPROSYN) 500 MG tablet TAKE 1 TABLET BY MOUTH TWICE DAILY WITH MEAL 180 tablet 0 Taking   No current facility-administered medications for this visit.    No Known Allergies  Social History   Tobacco Use  . Smoking status: Never Smoker  . Smokeless tobacco: Never Used  Substance Use Topics  . Alcohol use: No    Alcohol/week: 0.0 standard drinks    Family History  Problem Relation Age of Onset  . Heart disease Mother        pacemaker, hypertensin     Review of Systems  Constitutional: Positive for weight loss.  Musculoskeletal: Positive for joint pain.  All other systems reviewed  and are negative.   Objective:  Physical Exam  Constitutional: She is oriented to person, place, and time. She appears well-developed and well-nourished. No distress.  HENT:  Head: Normocephalic and atraumatic.  Nose: Nose normal.  Eyes: Pupils are equal, round, and reactive to light. Conjunctivae and EOM are normal.  Neck: Normal range of motion. Neck supple.  Cardiovascular: Normal rate, regular rhythm, normal heart sounds and intact distal pulses.  Respiratory: Effort normal and breath sounds normal. No respiratory distress. She has no wheezes.  GI: Soft.  Bowel sounds are normal. She exhibits no distension. There is no abdominal tenderness.  Musculoskeletal:     Right hip: She exhibits decreased range of motion, decreased strength, tenderness, bony tenderness and swelling.  Lymphadenopathy:    She has no cervical adenopathy.  Neurological: She is alert and oriented to person, place, and time.  Skin: Skin is warm and dry. No rash noted. No erythema.  Psychiatric: She has a normal mood and affect. Her behavior is normal.    Vital signs in last 24 hours: @VSRANGES @  Labs:   Estimated body mass index is 25.82 kg/m as calculated from the following:   Height as of 01/31/18: 4' 11.5" (1.511 m).   Weight as of 05/01/18: 59 kg.   Imaging Review Plain radiographs demonstrate severe degenerative joint disease of the right hip(s). The bone quality appears to be good for age and reported activity level.    Preoperative templating of the joint replacement has been completed, documented, and submitted to the Operating Room personnel in order to optimize intra-operative equipment management.     Assessment/Plan:  End stage arthritis, right hip(s)  The patient history, physical examination, clinical judgement of the provider and imaging studies are consistent with end stage degenerative joint disease of the right hip(s) and total hip arthroplasty is deemed medically necessary. The treatment options including medical management, injection therapy, arthroscopy and arthroplasty were discussed at length. The risks and benefits of total hip arthroplasty were presented and reviewed. The risks due to aseptic loosening, infection, stiffness, dislocation/subluxation,  thromboembolic complications and other imponderables were discussed.  The patient acknowledged the explanation, agreed to proceed with the plan and consent was signed. Patient is being admitted for inpatient treatment for surgery, pain control, PT, OT, prophylactic antibiotics, VTE prophylaxis,  progressive ambulation and ADL's and discharge planning.The patient is planning to be discharged home with home health services

## 2018-05-16 NOTE — H&P (View-Only) (Signed)
TOTAL HIP ADMISSION H&P  Patient is admitted for right total hip arthroplasty.  Subjective:  Chief Complaint: right hip pain  HPI: Rachel Austin, 62 y.o. female, has a history of pain and functional disability in the right hip(s) due to arthritis and patient has failed non-surgical conservative treatments for greater than 12 weeks to include NSAID's and/or analgesics, corticosteriod injections, use of assistive devices and activity modification.  Onset of symptoms was gradual starting >10 years ago with gradually worsening course since that time.The patient noted no past surgery on the right hip(s).  Patient currently rates pain in the right hip at 10 out of 10 with activity. Patient has night pain, worsening of pain with activity and weight bearing, trendelenberg gait, pain that interfers with activities of daily living and pain with passive range of motion. Patient has evidence of subchondral cysts, periarticular osteophytes and joint space narrowing by imaging studies. This condition presents safety issues increasing the risk of falls. There is no current active infection.  Patient Active Problem List   Diagnosis Date Noted  . Hyperlipidemia 03/01/2015  . Essential hypertension 03/01/2015  . Depression 03/01/2015  . Rheumatoid arthritis ([place]) 07/16/2014   Past Medical History:  Diagnosis Date  . Anemia   . Cataract 2018   small, bilateral  . Depression   . Hyperlipidemia   . Hypertension   . Rheumatoid arthritis ([place])    on meds    Past Surgical History:  Procedure Laterality Date  . FINGER SURGERY  01/29/2017   last 2 fingers left hand  . FOOT SURGERY  2016   Bil/ foot reconstruction due to RA  . KNEE SURGERY Bilateral    Right knee-2010, Left knee -2006  . VARICOSE VEIN SURGERY  2015   right     Current Outpatient Medications  Medication Sig Dispense Refill Last Dose  . Adalimumab 40 MG/0.8ML PSKT Inject into the skin. Inject 1 dose subq every 2 weeks or as directed.    Not Taking  . bisoprolol-hydrochlorothiazide (ZIAC) 5-6.25 MG tablet Take 1 tablet by mouth daily. 90 tablet 3 Taking  . CALCIUM PO Take by mouth. Calcium with Vit d 3 - 500 mg/2000-Take one tablet bid   Taking  . citalopram (CELEXA) 20 MG tablet Take 1 tablet (20 mg total) by mouth daily. 90 tablet 1 Taking  . citalopram (CELEXA) 20 MG tablet TAKE ONE TABLET BY MOUTH ONCE DAILY 90 tablet 1 Taking  . Ferrous Sulfate Dried (SLOW RELEASE IRON) 45 MG TBCR Take by mouth daily.   Taking  . methotrexate (RHEUMATREX) 2.5 MG tablet Take 2.5 mg by mouth once a week. Tale 6 pills by mouth every week.   Caution:Chemotherapy. Protect from light.   Taking  . Multiple Vitamins-Minerals (CENTRUM SILVER ULTRA WOMENS PO) Take by mouth.   Taking  . naproxen (NAPROSYN) 500 MG tablet TAKE 1 TABLET BY MOUTH TWICE DAILY WITH MEAL 180 tablet 0 Taking   No current facility-administered medications for this visit.    No Known Allergies  Social History   Tobacco Use  . Smoking status: Never Smoker  . Smokeless tobacco: Never Used  Substance Use Topics  . Alcohol use: No    Alcohol/week: 0.0 standard drinks    Family History  Problem Relation Age of Onset  . Heart disease Mother        pacemaker, hypertensin     Review of Systems  Constitutional: Positive for weight loss.  Musculoskeletal: Positive for joint pain.  All other systems reviewed  and are negative.   Objective:  Physical Exam  Constitutional: She is oriented to person, place, and time. She appears well-developed and well-nourished. No distress.  HENT:  Head: Normocephalic and atraumatic.  Nose: Nose normal.  Eyes: Pupils are equal, round, and reactive to light. Conjunctivae and EOM are normal.  Neck: Normal range of motion. Neck supple.  Cardiovascular: Normal rate, regular rhythm, normal heart sounds and intact distal pulses.  Respiratory: Effort normal and breath sounds normal. No respiratory distress. She has no wheezes.  GI: Soft.  Bowel sounds are normal. She exhibits no distension. There is no abdominal tenderness.  Musculoskeletal:     Right hip: She exhibits decreased range of motion, decreased strength, tenderness, bony tenderness and swelling.  Lymphadenopathy:    She has no cervical adenopathy.  Neurological: She is alert and oriented to person, place, and time.  Skin: Skin is warm and dry. No rash noted. No erythema.  Psychiatric: She has a normal mood and affect. Her behavior is normal.    Vital signs in last 24 hours: @VSRANGES @  Labs:   Estimated body mass index is 25.82 kg/m as calculated from the following:   Height as of 01/31/18: 4' 11.5" (1.511 m).   Weight as of 05/01/18: 59 kg.   Imaging Review Plain radiographs demonstrate severe degenerative joint disease of the right hip(s). The bone quality appears to be good for age and reported activity level.    Preoperative templating of the joint replacement has been completed, documented, and submitted to the Operating Room personnel in order to optimize intra-operative equipment management.     Assessment/Plan:  End stage arthritis, right hip(s)  The patient history, physical examination, clinical judgement of the provider and imaging studies are consistent with end stage degenerative joint disease of the right hip(s) and total hip arthroplasty is deemed medically necessary. The treatment options including medical management, injection therapy, arthroscopy and arthroplasty were discussed at length. The risks and benefits of total hip arthroplasty were presented and reviewed. The risks due to aseptic loosening, infection, stiffness, dislocation/subluxation,  thromboembolic complications and other imponderables were discussed.  The patient acknowledged the explanation, agreed to proceed with the plan and consent was signed. Patient is being admitted for inpatient treatment for surgery, pain control, PT, OT, prophylactic antibiotics, VTE prophylaxis,  progressive ambulation and ADL's and discharge planning.The patient is planning to be discharged home with home health services

## 2018-05-19 ENCOUNTER — Other Ambulatory Visit: Payer: Self-pay | Admitting: Adult Health

## 2018-05-19 DIAGNOSIS — M25551 Pain in right hip: Secondary | ICD-10-CM

## 2018-05-19 DIAGNOSIS — M25552 Pain in left hip: Principal | ICD-10-CM

## 2018-05-21 NOTE — Progress Notes (Signed)
05-01-18 (Epic) EKG  05-02-18 (Epic) CXR

## 2018-05-21 NOTE — Telephone Encounter (Signed)
Sent to the pharmacy by e-scribe.  Pt having surgery on 05/31/18.

## 2018-05-21 NOTE — Patient Instructions (Signed)
Rachel Austin  05/21/2018   Your procedure is scheduled on: 05-31-18    Report to [facility] Main  Entrance    Report to Admitting at 5:30 AM    Call this number if you have problems the morning of surgery [phone]    Remember: Do not eat food or drink liquids :After Midnight.     BRUSH YOUR TEETH MORNING OF SURGERY AND RINSE YOUR MOUTH OUT, NO CHEWING GUM CANDY OR MINTS.     Take these medicines the morning of surgery with A SIP OF WATER: Citalopram (Celexa)                                 You may not have any metal on your body including hair pins and              piercings  Do not wear jewelry, make-up, lotions, powders or perfumes, deodorant             Do not wear nail polish.  Do not shave  48 hours prior to surgery.                 Do not bring valuables to the hospital. [place].  Contacts, dentures or bridgework may not be worn into surgery.  Leave suitcase in the car. After surgery it may be brought to your room.     Patients discharged the day of surgery will not be allowed to drive home.  Name and phone number of your driver:  Special Instructions: N/A              Please read over the following fact sheets you were given: _____________________________________________________________________             [facility] - Preparing for Surgery Before surgery, you can play an important role.  Because skin is not sterile, your skin needs to be as free of germs as possible.  You can reduce the number of germs on your skin by washing with CHG (chlorahexidine gluconate) soap before surgery.  CHG is an antiseptic cleaner which kills germs and bonds with the skin to continue killing germs even after washing. Please DO NOT use if you have an allergy to CHG or antibacterial soaps.  If your skin becomes reddened/irritated stop using the CHG and inform your nurse when you arrive at Short Stay. Do  not shave (including legs and underarms) for at least 48 hours prior to the first CHG shower.  You may shave your face/neck. Please follow these instructions carefully:  1.  Shower with CHG Soap the night before surgery and the  morning of Surgery.  2.  If you choose to wash your hair, wash your hair first as usual with your  normal  shampoo.  3.  After you shampoo, rinse your hair and body thoroughly to remove the  shampoo.                           4.  Use CHG as you would any other liquid soap.  You can apply chg directly  to the skin and wash  Gently with a scrungie or clean washcloth.  5.  Apply the CHG Soap to your body ONLY FROM THE NECK DOWN.   Do not use on face/ open                           Wound or open sores. Avoid contact with eyes, ears mouth and genitals (private parts).                       Wash face,  Genitals (private parts) with your normal soap.             6.  Wash thoroughly, paying special attention to the area where your surgery  will be performed.  7.  Thoroughly rinse your body with warm water from the neck down.  8.  DO NOT shower/wash with your normal soap after using and rinsing off  the CHG Soap.                9.  Pat yourself dry with a clean towel.            10.  Wear clean pajamas.            11.  Place clean sheets on your bed the night of your first shower and do not  sleep with pets. Day of Surgery : Do not apply any lotions/deodorants the morning of surgery.  Please wear clean clothes to the hospital/surgery center.  FAILURE TO FOLLOW THESE INSTRUCTIONS MAY RESULT IN THE CANCELLATION OF YOUR SURGERY PATIENT SIGNATURE_________________________________  NURSE SIGNATURE__________________________________  ________________________________________________________________________   Adam Phenix  An incentive spirometer is a tool that can help keep your lungs clear and active. This tool measures how well you are filling your  lungs with each breath. Taking long deep breaths may help reverse or decrease the chance of developing breathing (pulmonary) problems (especially infection) following:  A long period of time when you are unable to move or be active. BEFORE THE PROCEDURE   If the spirometer includes an indicator to show your best effort, your nurse or respiratory therapist will set it to a desired goal.  If possible, sit up straight or lean slightly forward. Try not to slouch.  Hold the incentive spirometer in an upright position. INSTRUCTIONS FOR USE  1. Sit on the edge of your bed if possible, or sit up as far as you can in bed or on a chair. 2. Hold the incentive spirometer in an upright position. 3. Breathe out normally. 4. Place the mouthpiece in your mouth and seal your lips tightly around it. 5. Breathe in slowly and as deeply as possible, raising the piston or the ball toward the top of the column. 6. Hold your breath for 3-5 seconds or for as long as possible. Allow the piston or ball to fall to the bottom of the column. 7. Remove the mouthpiece from your mouth and breathe out normally. 8. Rest for a few seconds and repeat Steps 1 through 7 at least 10 times every 1-2 hours when you are awake. Take your time and take a few normal breaths between deep breaths. 9. The spirometer may include an indicator to show your best effort. Use the indicator as a goal to work toward during each repetition. 10. After each set of 10 deep breaths, practice coughing to be sure your lungs are clear. If you have an incision (the cut made at the time of surgery),  support your incision when coughing by placing a pillow or rolled up towels firmly against it. Once you are able to get out of bed, walk around indoors and cough well. You may stop using the incentive spirometer when instructed by your caregiver.  RISKS AND COMPLICATIONS  Take your time so you do not get dizzy or light-headed.  If you are in pain, you may need  to take or ask for pain medication before doing incentive spirometry. It is harder to take a deep breath if you are having pain. AFTER USE  Rest and breathe slowly and easily.  It can be helpful to keep track of a log of your progress. Your caregiver can provide you with a simple table to help with this. If you are using the spirometer at home, follow these instructions: [place] IF:   You are having difficultly using the spirometer.  You have trouble using the spirometer as often as instructed.  Your pain medication is not giving enough relief while using the spirometer.  You develop fever of 100.5 F (38.1 C) or higher. SEEK IMMEDIATE MEDICAL CARE IF:   You cough up bloody sputum that had not been present before.  You develop fever of 102 F (38.9 C) or greater.  You develop worsening pain at or near the incision site. MAKE SURE YOU:   Understand these instructions.  Will watch your condition.  Will get help right away if you are not doing well or get worse. Document Released: 09/18/2006 Document Revised: 07/31/2011 Document Reviewed: 11/19/2006 ExitCare Patient Information 2014 ExitCare, [place].   ________________________________________________________________________  WHAT IS A BLOOD TRANSFUSION? Blood Transfusion Information  A transfusion is the replacement of blood or some of its parts. Blood is made up of multiple cells which provide different functions.  Red blood cells carry oxygen and are used for blood loss replacement.  White blood cells fight against infection.  Platelets control bleeding.  Plasma helps clot blood.  Other blood products are available for specialized needs, such as hemophilia or other clotting disorders. BEFORE THE TRANSFUSION  Who gives blood for transfusions?   Healthy volunteers who are fully evaluated to make sure their blood is safe. This is blood bank blood. Transfusion therapy is the safest it has ever been in the  practice of medicine. Before blood is taken from a donor, a complete history is taken to make sure that person has no history of diseases nor engages in risky social behavior (examples are intravenous drug use or sexual activity with multiple partners). The donor's travel history is screened to minimize risk of transmitting infections, such as malaria. The donated blood is tested for signs of infectious diseases, such as HIV and hepatitis. The blood is then tested to be sure it is compatible with you in order to minimize the chance of a transfusion reaction. If you or a relative donates blood, this is often done in anticipation of surgery and is not appropriate for emergency situations. It takes many days to process the donated blood. RISKS AND COMPLICATIONS Although transfusion therapy is very safe and saves many lives, the main dangers of transfusion include:   Getting an infectious disease.  Developing a transfusion reaction. This is an allergic reaction to something in the blood you were given. Every precaution is taken to prevent this. The decision to have a blood transfusion has been considered carefully by your caregiver before blood is given. Blood is not given unless the benefits outweigh the risks. AFTER THE TRANSFUSION  Right after receiving a blood transfusion, you will usually feel much better and more energetic. This is especially true if your red blood cells have gotten low (anemic). The transfusion raises the level of the red blood cells which carry oxygen, and this usually causes an energy increase.  The nurse administering the transfusion will monitor you carefully for complications. HOME CARE INSTRUCTIONS  No special instructions are needed after a transfusion. You may find your energy is better. Speak with your caregiver about any limitations on activity for underlying diseases you may have. SEEK MEDICAL CARE IF:   Your condition is not improving after your transfusion.  You  develop redness or irritation at the intravenous (IV) site. SEEK IMMEDIATE MEDICAL CARE IF:  Any of the following symptoms occur over the next 12 hours:  Shaking chills.  You have a temperature by mouth above 102 F (38.9 C), not controlled by medicine.  Chest, back, or muscle pain.  People around you feel you are not acting correctly or are confused.  Shortness of breath or difficulty breathing.  Dizziness and fainting.  You get a rash or develop hives.  You have a decrease in urine output.  Your urine turns a dark color or changes to pink, red, or brown. Any of the following symptoms occur over the next 10 days:  You have a temperature by mouth above 102 F (38.9 C), not controlled by medicine.  Shortness of breath.  Weakness after normal activity.  The white part of the eye turns yellow (jaundice).  You have a decrease in the amount of urine or are urinating less often.  Your urine turns a dark color or changes to pink, red, or brown. Document Released: 05/05/2000 Document Revised: 07/31/2011 Document Reviewed: 12/23/2007 [facility] Patient Information 2014 [place], [place].  _______________________________________________________________________

## 2018-05-24 ENCOUNTER — Other Ambulatory Visit (HOSPITAL_COMMUNITY): Payer: Medicare Other

## 2018-05-24 ENCOUNTER — Other Ambulatory Visit: Payer: Self-pay

## 2018-05-24 ENCOUNTER — Encounter (HOSPITAL_COMMUNITY): Payer: Self-pay

## 2018-05-24 ENCOUNTER — Encounter (HOSPITAL_COMMUNITY)
Admission: RE | Admit: 2018-05-24 | Discharge: 2018-05-24 | Disposition: A | Discharge status: Home / Self Care | Payer: Medicare Other | Source: Ambulatory Visit | Attending: Orthopedic Surgery | Admitting: Orthopedic Surgery

## 2018-05-24 DIAGNOSIS — Z01812 Encounter for preprocedural laboratory examination: Secondary | ICD-10-CM | POA: Insufficient documentation

## 2018-05-24 LAB — CBC WITH DIFFERENTIAL/PLATELET
Abs Immature Granulocytes: 0.02 10*3/uL (ref 0.00–0.07)
Basophils Absolute: 0 10*3/uL (ref 0.0–0.1)
Basophils Relative: 1 %
Eosinophils Absolute: 0.1 10*3/uL (ref 0.0–0.5)
Eosinophils Relative: 2 %
HCT: 36.3 % (ref 36.0–46.0)
Hemoglobin: 10.7 g/dL — ABNORMAL LOW (ref 12.0–15.0)
Immature Granulocytes: 0 %
Lymphocytes Relative: 46 %
Lymphs Abs: 3 10*3/uL (ref 0.7–4.0)
MCH: 26.1 pg (ref 26.0–34.0)
MCHC: 29.5 g/dL — ABNORMAL LOW (ref 30.0–36.0)
MCV: 88.5 fL (ref 80.0–100.0)
MONO ABS: 0.5 10*3/uL (ref 0.1–1.0)
Monocytes Relative: 7 %
NEUTROS ABS: 2.9 10*3/uL (ref 1.7–7.7)
Neutrophils Relative %: 44 %
PLATELETS: 373 10*3/uL (ref 150–400)
RBC: 4.1 MIL/uL (ref 3.87–5.11)
RDW: 15.2 % (ref 11.5–15.5)
WBC: 6.6 10*3/uL (ref 4.0–10.5)
nRBC: 0 % (ref 0.0–0.2)

## 2018-05-24 LAB — COMPREHENSIVE METABOLIC PANEL
ALT: 11 U/L (ref 0–44)
AST: 21 U/L (ref 15–41)
Albumin: 3.7 g/dL (ref 3.5–5.0)
Alkaline Phosphatase: 98 U/L (ref 38–126)
Anion gap: 12 (ref 5–15)
BUN: 11 mg/dL (ref 8–23)
CHLORIDE: 103 mmol/L (ref 98–111)
CO2: 24 mmol/L (ref 22–32)
Calcium: 10.2 mg/dL (ref 8.9–10.3)
Creatinine, Ser: 0.49 mg/dL (ref 0.44–1.00)
GFR calc non Af Amer: >60 mL/min (ref >60)
Glucose, Bld: 85 mg/dL (ref 70–99)
Potassium: 3.8 mmol/L (ref 3.5–5.1)
Sodium: 139 mmol/L (ref 135–145)
Total Bilirubin: 0.5 mg/dL (ref 0.3–1.2)
Total Protein: 8.2 g/dL — ABNORMAL HIGH (ref 6.5–8.1)

## 2018-05-24 LAB — URINALYSIS, ROUTINE W REFLEX MICROSCOPIC
Bilirubin Urine: NEGATIVE
GLUCOSE, UA: NEGATIVE mg/dL
Hgb urine dipstick: NEGATIVE
Ketones, ur: NEGATIVE mg/dL
Leukocytes, UA: NEGATIVE
Nitrite: NEGATIVE
Protein, ur: NEGATIVE mg/dL
Specific Gravity, Urine: 1.004 — ABNORMAL LOW (ref 1.005–1.030)
pH: 5 (ref 5.0–8.0)

## 2018-05-24 LAB — PROTIME-INR
INR: 1
Prothrombin Time: 13.1 seconds (ref 11.4–15.2)

## 2018-05-24 LAB — APTT: aPTT: 28 seconds (ref 24–36)

## 2018-05-24 LAB — SURGICAL PCR SCREEN
MRSA, PCR: NEGATIVE
Staphylococcus aureus: NEGATIVE

## 2018-05-26 LAB — URINE CULTURE

## 2018-05-27 LAB — TYPE AND SCREEN
ABO/RH(D): A POS
Antibody Screen: POSITIVE
DAT, IgG: NEGATIVE

## 2018-05-27 NOTE — Progress Notes (Addendum)
ABNORMAL URINE CULTURE FROM PRE-OP LABS. CHART PLACED IN APP BASKET TO REVIEW  [facility] ROUTED TO SURGEON , DR CAFFREY

## 2018-05-30 MED ORDER — BUPIVACAINE LIPOSOME 1.3 % IJ SUSP
10.0000 mL | Freq: Once | INTRAMUSCULAR | Status: DC
  Filled 2018-05-30: qty 10

## 2018-05-30 MED ORDER — TRANEXAMIC ACID 1000 MG/10ML IV SOLN
2000.0000 mg | INTRAVENOUS | Status: DC
  Filled 2018-05-30: qty 20

## 2018-05-30 NOTE — Anesthesia Preprocedure Evaluation (Addendum)
Anesthesia Evaluation  Patient identified by MRN, date of birth, ID band Patient awake    Reviewed: Allergy & Precautions, NPO status , Patient's Chart, lab work & pertinent test results  Airway Mallampati: II  TM Distance: >3 FB Neck ROM: Full    Dental no notable dental hx. (+) Partial Upper, Dental Advisory Given   Pulmonary neg pulmonary ROS,    Pulmonary exam normal breath sounds clear to auscultation       Cardiovascular hypertension, Pt. on medications and Pt. on home beta blockers Normal cardiovascular exam Rhythm:Regular Rate:Normal  SB on EKg 05/01/18   Neuro/Psych Depression negative neurological ROS     GI/Hepatic negative GI ROS, Neg liver ROS,   Endo/Other  negative endocrine ROS  Renal/GU negative Renal ROS     Musculoskeletal  (+) Arthritis , Rheumatoid disorders,    Abdominal   Peds  Hematology  (+) Blood dyscrasia, anemia , Hgb (10.7)   Anesthesia Other Findings   Reproductive/Obstetrics                            Anesthesia Physical Anesthesia Plan  ASA: III  Anesthesia Plan: General   Post-op Pain Management:    Induction: Intravenous  PONV Risk Score and Plan: 2 and Ondansetron, Treatment may vary due to age or medical condition and Dexamethasone  Airway Management Planned: Oral ETT  Additional Equipment:   Intra-op Plan:   Post-operative Plan: Extubation in OR  Informed Consent: I have reviewed the patients History and Physical, chart, labs and discussed the procedure including the risks, benefits and alternatives for the proposed anesthesia with the patient or authorized representative who has indicated his/her understanding and acceptance.   Dental advisory given  Plan Discussed with: CRNA  Anesthesia Plan Comments:       Anesthesia Quick Evaluation

## 2018-05-31 ENCOUNTER — Inpatient Hospital Stay (HOSPITAL_COMMUNITY): Payer: Medicare Other | Admitting: Anesthesiology

## 2018-05-31 ENCOUNTER — Encounter (HOSPITAL_COMMUNITY)
Admission: RE | Disposition: A | Discharge status: Home Health | Payer: Self-pay | Source: Home / Self Care | Attending: Orthopedic Surgery

## 2018-05-31 ENCOUNTER — Inpatient Hospital Stay (HOSPITAL_COMMUNITY)
Admission: RE | Admit: 2018-05-31 | Discharge: 2018-06-02 | DRG: 470 | Disposition: A | Discharge status: Home Health | Payer: Medicare Other | Attending: Orthopedic Surgery | Admitting: Orthopedic Surgery

## 2018-05-31 ENCOUNTER — Other Ambulatory Visit: Payer: Self-pay

## 2018-05-31 ENCOUNTER — Encounter (HOSPITAL_COMMUNITY): Payer: Self-pay | Admitting: *Deleted

## 2018-05-31 ENCOUNTER — Inpatient Hospital Stay (HOSPITAL_COMMUNITY): Payer: Medicare Other

## 2018-05-31 ENCOUNTER — Inpatient Hospital Stay (HOSPITAL_COMMUNITY): Payer: Medicare Other | Admitting: Physician Assistant

## 2018-05-31 DIAGNOSIS — M25551 Pain in right hip: Secondary | ICD-10-CM | POA: Diagnosis present

## 2018-05-31 DIAGNOSIS — F329 Major depressive disorder, single episode, unspecified: Secondary | ICD-10-CM | POA: Diagnosis present

## 2018-05-31 DIAGNOSIS — F32A Depression, unspecified: Secondary | ICD-10-CM | POA: Diagnosis present

## 2018-05-31 DIAGNOSIS — I10 Essential (primary) hypertension: Secondary | ICD-10-CM | POA: Diagnosis present

## 2018-05-31 DIAGNOSIS — Z8249 Family history of ischemic heart disease and other diseases of the circulatory system: Secondary | ICD-10-CM | POA: Diagnosis not present

## 2018-05-31 DIAGNOSIS — Z79899 Other long term (current) drug therapy: Secondary | ICD-10-CM | POA: Diagnosis not present

## 2018-05-31 DIAGNOSIS — M1611 Unilateral primary osteoarthritis, right hip: Secondary | ICD-10-CM | POA: Diagnosis present

## 2018-05-31 DIAGNOSIS — D508 Other iron deficiency anemias: Secondary | ICD-10-CM

## 2018-05-31 DIAGNOSIS — D509 Iron deficiency anemia, unspecified: Secondary | ICD-10-CM | POA: Diagnosis present

## 2018-05-31 DIAGNOSIS — Z791 Long term (current) use of non-steroidal anti-inflammatories (NSAID): Secondary | ICD-10-CM | POA: Diagnosis not present

## 2018-05-31 DIAGNOSIS — D62 Acute posthemorrhagic anemia: Secondary | ICD-10-CM | POA: Diagnosis not present

## 2018-05-31 DIAGNOSIS — M05721 Rheumatoid arthritis with rheumatoid factor of right elbow without organ or systems involvement: Secondary | ICD-10-CM

## 2018-05-31 DIAGNOSIS — E7801 Familial hypercholesterolemia: Secondary | ICD-10-CM

## 2018-05-31 DIAGNOSIS — F3289 Other specified depressive episodes: Secondary | ICD-10-CM

## 2018-05-31 DIAGNOSIS — E785 Hyperlipidemia, unspecified: Secondary | ICD-10-CM | POA: Diagnosis present

## 2018-05-31 DIAGNOSIS — M069 Rheumatoid arthritis, unspecified: Secondary | ICD-10-CM | POA: Diagnosis present

## 2018-05-31 HISTORY — DX: Acute posthemorrhagic anemia: D62

## 2018-05-31 HISTORY — DX: Iron deficiency anemia, unspecified: D50.9

## 2018-05-31 HISTORY — PX: TOTAL HIP ARTHROPLASTY: SHX124

## 2018-05-31 LAB — HEMOGLOBIN AND HEMATOCRIT, BLOOD
HCT: 23.5 % — ABNORMAL LOW (ref 36.0–46.0)
Hemoglobin: 7 g/dL — ABNORMAL LOW (ref 12.0–15.0)

## 2018-05-31 LAB — CBC
HCT: 30 % — ABNORMAL LOW (ref 36.0–46.0)
HEMOGLOBIN: 9.1 g/dL — AB (ref 12.0–15.0)
MCH: 26.6 pg (ref 26.0–34.0)
MCHC: 30.3 g/dL (ref 30.0–36.0)
MCV: 87.7 fL (ref 80.0–100.0)
Platelets: 383 10*3/uL (ref 150–400)
RBC: 3.42 MIL/uL — AB (ref 3.87–5.11)
RDW: 14.8 % (ref 11.5–15.5)
WBC: 7.5 10*3/uL (ref 4.0–10.5)
nRBC: 0 % (ref 0.0–0.2)

## 2018-05-31 LAB — PREPARE RBC (CROSSMATCH)

## 2018-05-31 LAB — PROTIME-INR
INR: 1.18
Prothrombin Time: 14.9 seconds (ref 11.4–15.2)

## 2018-05-31 SURGERY — ARTHROPLASTY, HIP, TOTAL,POSTERIOR APPROACH
Anesthesia: Spinal | Site: Hip | Laterality: Right

## 2018-05-31 MED ORDER — ALBUMIN HUMAN 5 % IV SOLN
INTRAVENOUS | Status: AC
  Filled 2018-05-31: qty 250

## 2018-05-31 MED ORDER — ONDANSETRON HCL 4 MG PO TABS: 4.0000 mg | ORAL_TABLET | Freq: Four times a day (QID) | ORAL | Status: DC | PRN

## 2018-05-31 MED ORDER — FENTANYL CITRATE (PF) 100 MCG/2ML IJ SOLN
INTRAMUSCULAR | Status: DC | PRN
  Administered 2018-05-31 (×4): 50 ug via INTRAVENOUS

## 2018-05-31 MED ORDER — WATER FOR IRRIGATION, STERILE IR SOLN
Status: DC | PRN
  Administered 2018-05-31: 2000 mL

## 2018-05-31 MED ORDER — FLEET ENEMA 7-19 GM/118ML RE ENEM: 1.0000 | ENEMA | Freq: Once | RECTAL | Status: DC | PRN

## 2018-05-31 MED ORDER — ASPIRIN EC 81 MG PO TBEC: 81.0000 mg | DELAYED_RELEASE_TABLET | Freq: Two times a day (BID) | ORAL | 0 refills | Status: AC

## 2018-05-31 MED ORDER — SODIUM CHLORIDE 0.9 % IV SOLN
INTRAVENOUS | Status: DC
  Administered 2018-05-31 – 2018-06-01 (×2): via INTRAVENOUS

## 2018-05-31 MED ORDER — ACETAMINOPHEN 10 MG/ML IV SOLN
INTRAVENOUS | Status: AC
  Filled 2018-05-31: qty 100

## 2018-05-31 MED ORDER — FENTANYL CITRATE (PF) 100 MCG/2ML IJ SOLN
INTRAMUSCULAR | Status: AC
  Filled 2018-05-31: qty 2

## 2018-05-31 MED ORDER — PHENOL 1.4 % MT LIQD: 1.0000 | OROMUCOSAL | Status: DC | PRN

## 2018-05-31 MED ORDER — METHOCARBAMOL 500 MG IVPB - SIMPLE MED
500.0000 mg | Freq: Four times a day (QID) | INTRAVENOUS | Status: DC | PRN
  Administered 2018-05-31: 500 mg via INTRAVENOUS
  Filled 2018-05-31: qty 50

## 2018-05-31 MED ORDER — SODIUM CHLORIDE 0.9 % IV BOLUS
500.0000 mL | Freq: Once | INTRAVENOUS | Status: AC
  Administered 2018-05-31: 500 mL via INTRAVENOUS

## 2018-05-31 MED ORDER — FERROUS SULFATE 325 (65 FE) MG PO TABS
325.0000 mg | ORAL_TABLET | Freq: Every day | ORAL | Status: DC
  Administered 2018-06-01 – 2018-06-02 (×2): 325 mg via ORAL
  Filled 2018-05-31 (×2): qty 1

## 2018-05-31 MED ORDER — TRANEXAMIC ACID-NACL 1000-0.7 MG/100ML-% IV SOLN
1000.0000 mg | Freq: Once | INTRAVENOUS | Status: AC
  Administered 2018-05-31: 1000 mg via INTRAVENOUS
  Filled 2018-05-31: qty 100

## 2018-05-31 MED ORDER — PROPOFOL 10 MG/ML IV BOLUS
INTRAVENOUS | Status: DC | PRN
  Administered 2018-05-31: 80 mg via INTRAVENOUS

## 2018-05-31 MED ORDER — CITALOPRAM HYDROBROMIDE 20 MG PO TABS
20.0000 mg | ORAL_TABLET | Freq: Every day | ORAL | Status: DC
  Administered 2018-05-31 – 2018-06-02 (×3): 20 mg via ORAL
  Filled 2018-05-31 (×3): qty 1

## 2018-05-31 MED ORDER — ONDANSETRON HCL 4 MG/2ML IJ SOLN: 4.0000 mg | Freq: Once | INTRAMUSCULAR | Status: DC | PRN

## 2018-05-31 MED ORDER — CEFAZOLIN SODIUM-DEXTROSE 2-4 GM/100ML-% IV SOLN
2.0000 g | INTRAVENOUS | Status: AC
  Administered 2018-05-31: 2 g via INTRAVENOUS
  Filled 2018-05-31: qty 100

## 2018-05-31 MED ORDER — BUPIVACAINE-EPINEPHRINE (PF) 0.25% -1:200000 IJ SOLN
INTRAMUSCULAR | Status: AC
  Filled 2018-05-31: qty 30

## 2018-05-31 MED ORDER — HYDROMORPHONE HCL 1 MG/ML IJ SOLN
INTRAMUSCULAR | Status: AC
  Filled 2018-05-31: qty 2

## 2018-05-31 MED ORDER — HYDROCODONE-ACETAMINOPHEN 7.5-325 MG PO TABS: 1.0000 | ORAL_TABLET | Freq: Once | ORAL | Status: DC | PRN

## 2018-05-31 MED ORDER — SODIUM CHLORIDE 0.9% IV SOLUTION: Freq: Once | INTRAVENOUS | Status: DC

## 2018-05-31 MED ORDER — ALBUMIN HUMAN 5 % IV SOLN
INTRAVENOUS | Status: DC | PRN
  Administered 2018-05-31: 10:00:00 via INTRAVENOUS

## 2018-05-31 MED ORDER — SODIUM CHLORIDE 0.9 % IV SOLN: INTRAVENOUS | Status: DC

## 2018-05-31 MED ORDER — BUPIVACAINE-EPINEPHRINE 0.25% -1:200000 IJ SOLN
INTRAMUSCULAR | Status: DC | PRN
  Administered 2018-05-31: 20 mL

## 2018-05-31 MED ORDER — METOCLOPRAMIDE HCL 5 MG PO TABS: 5.0000 mg | ORAL_TABLET | Freq: Three times a day (TID) | ORAL | Status: DC | PRN

## 2018-05-31 MED ORDER — LACTATED RINGERS IV SOLN
INTRAVENOUS | Status: DC | PRN
  Administered 2018-05-31 (×2): via INTRAVENOUS

## 2018-05-31 MED ORDER — ONDANSETRON HCL 4 MG/2ML IJ SOLN: 4.0000 mg | Freq: Four times a day (QID) | INTRAMUSCULAR | Status: DC | PRN

## 2018-05-31 MED ORDER — METOCLOPRAMIDE HCL 5 MG/ML IJ SOLN: 5.0000 mg | Freq: Three times a day (TID) | INTRAMUSCULAR | Status: DC | PRN

## 2018-05-31 MED ORDER — ONDANSETRON HCL 4 MG/2ML IJ SOLN
INTRAMUSCULAR | Status: AC
  Filled 2018-05-31: qty 2

## 2018-05-31 MED ORDER — ACETAMINOPHEN 500 MG PO TABS
1000.0000 mg | ORAL_TABLET | Freq: Four times a day (QID) | ORAL | Status: AC
  Administered 2018-05-31 – 2018-06-01 (×4): 1000 mg via ORAL
  Filled 2018-05-31 (×4): qty 2

## 2018-05-31 MED ORDER — CHLORHEXIDINE GLUCONATE 4 % EX LIQD: 60.0000 mL | Freq: Once | CUTANEOUS | Status: DC

## 2018-05-31 MED ORDER — ROCURONIUM BROMIDE 10 MG/ML (PF) SYRINGE
PREFILLED_SYRINGE | INTRAVENOUS | Status: AC
  Filled 2018-05-31: qty 10

## 2018-05-31 MED ORDER — ASPIRIN 81 MG PO CHEW
81.0000 mg | CHEWABLE_TABLET | Freq: Two times a day (BID) | ORAL | Status: DC
  Administered 2018-05-31 – 2018-06-02 (×4): 81 mg via ORAL
  Filled 2018-05-31 (×4): qty 1

## 2018-05-31 MED ORDER — SODIUM CHLORIDE (PF) 0.9 % IJ SOLN
INTRAMUSCULAR | Status: AC
  Filled 2018-05-31: qty 50

## 2018-05-31 MED ORDER — SUGAMMADEX SODIUM 200 MG/2ML IV SOLN
INTRAVENOUS | Status: DC | PRN
  Administered 2018-05-31: 150 mg via INTRAVENOUS

## 2018-05-31 MED ORDER — CEFAZOLIN SODIUM-DEXTROSE 1-4 GM/50ML-% IV SOLN
1.0000 g | Freq: Four times a day (QID) | INTRAVENOUS | Status: AC
  Administered 2018-05-31: 1 g via INTRAVENOUS
  Filled 2018-05-31: qty 50

## 2018-05-31 MED ORDER — TRANEXAMIC ACID-NACL 1000-0.7 MG/100ML-% IV SOLN
1000.0000 mg | INTRAVENOUS | Status: AC
  Administered 2018-05-31: 1000 mg via INTRAVENOUS
  Filled 2018-05-31: qty 100

## 2018-05-31 MED ORDER — OXYCODONE HCL 5 MG PO TABA: ORAL_TABLET | ORAL | 0 refills | Status: DC

## 2018-05-31 MED ORDER — DOCUSATE SODIUM 100 MG PO CAPS
100.0000 mg | ORAL_CAPSULE | Freq: Two times a day (BID) | ORAL | Status: DC
  Administered 2018-05-31 – 2018-06-02 (×4): 100 mg via ORAL
  Filled 2018-05-31 (×4): qty 1

## 2018-05-31 MED ORDER — PHENYLEPHRINE 40 MCG/ML (10ML) SYRINGE FOR IV PUSH (FOR BLOOD PRESSURE SUPPORT)
PREFILLED_SYRINGE | INTRAVENOUS | Status: AC
  Filled 2018-05-31: qty 10

## 2018-05-31 MED ORDER — ACETAMINOPHEN 325 MG PO TABS
325.0000 mg | ORAL_TABLET | Freq: Four times a day (QID) | ORAL | Status: DC | PRN
  Administered 2018-06-02 (×2): 650 mg via ORAL
  Filled 2018-05-31 (×2): qty 2

## 2018-05-31 MED ORDER — LACTATED RINGERS IV SOLN
INTRAVENOUS | Status: DC
  Administered 2018-05-31 (×2): via INTRAVENOUS

## 2018-05-31 MED ORDER — SORBITOL 70 % SOLN
30.0000 mL | Freq: Every day | Status: DC | PRN
  Filled 2018-05-31: qty 30

## 2018-05-31 MED ORDER — ACETAMINOPHEN 325 MG PO TABS: 650.0000 mg | ORAL_TABLET | ORAL | 2 refills | Status: AC | PRN

## 2018-05-31 MED ORDER — DOCUSATE SODIUM 100 MG PO CAPS: 100.0000 mg | ORAL_CAPSULE | Freq: Every day | ORAL | 2 refills | Status: AC | PRN

## 2018-05-31 MED ORDER — BISOPROLOL-HYDROCHLOROTHIAZIDE 5-6.25 MG PO TABS
1.0000 | ORAL_TABLET | Freq: Every day | ORAL | Status: DC
  Filled 2018-05-31 (×2): qty 1

## 2018-05-31 MED ORDER — METHOCARBAMOL 500 MG PO TABS
500.0000 mg | ORAL_TABLET | Freq: Four times a day (QID) | ORAL | Status: DC | PRN
  Filled 2018-05-31: qty 1

## 2018-05-31 MED ORDER — MIDAZOLAM HCL 5 MG/5ML IJ SOLN
INTRAMUSCULAR | Status: DC | PRN
  Administered 2018-05-31 (×2): 1 mg via INTRAVENOUS

## 2018-05-31 MED ORDER — SUGAMMADEX SODIUM 200 MG/2ML IV SOLN
INTRAVENOUS | Status: AC
  Filled 2018-05-31: qty 2

## 2018-05-31 MED ORDER — MIDAZOLAM HCL 2 MG/2ML IJ SOLN
INTRAMUSCULAR | Status: AC
  Filled 2018-05-31: qty 2

## 2018-05-31 MED ORDER — MENTHOL 3 MG MT LOZG: 1.0000 | LOZENGE | OROMUCOSAL | Status: DC | PRN

## 2018-05-31 MED ORDER — ROCURONIUM BROMIDE 100 MG/10ML IV SOLN
INTRAVENOUS | Status: DC | PRN
  Administered 2018-05-31: 30 mg via INTRAVENOUS
  Administered 2018-05-31: 20 mg via INTRAVENOUS

## 2018-05-31 MED ORDER — TRANEXAMIC ACID 1000 MG/10ML IV SOLN
INTRAVENOUS | Status: DC | PRN
  Administered 2018-05-31: 2000 mg via TOPICAL

## 2018-05-31 MED ORDER — FUROSEMIDE 10 MG/ML IJ SOLN
20.0000 mg | Freq: Once | INTRAMUSCULAR | Status: AC
  Administered 2018-05-31: 20 mg via INTRAVENOUS
  Filled 2018-05-31: qty 2

## 2018-05-31 MED ORDER — PHENYLEPHRINE 40 MCG/ML (10ML) SYRINGE FOR IV PUSH (FOR BLOOD PRESSURE SUPPORT)
PREFILLED_SYRINGE | INTRAVENOUS | Status: DC | PRN
  Administered 2018-05-31: 40 ug via INTRAVENOUS
  Administered 2018-05-31 (×2): 80 ug via INTRAVENOUS

## 2018-05-31 MED ORDER — OXYCODONE HCL 5 MG PO TABS
5.0000 mg | ORAL_TABLET | ORAL | Status: DC | PRN
  Administered 2018-05-31 – 2018-06-02 (×6): 5 mg via ORAL
  Filled 2018-05-31 (×7): qty 1

## 2018-05-31 MED ORDER — MEPERIDINE HCL 50 MG/ML IJ SOLN: 6.2500 mg | INTRAMUSCULAR | Status: DC | PRN

## 2018-05-31 MED ORDER — BUPIVACAINE LIPOSOME 1.3 % IJ SUSP
INTRAMUSCULAR | Status: DC | PRN
  Administered 2018-05-31: 10 mL

## 2018-05-31 MED ORDER — HYDROMORPHONE HCL 1 MG/ML IJ SOLN
0.2500 mg | INTRAMUSCULAR | Status: DC | PRN
  Administered 2018-05-31 (×4): 0.5 mg via INTRAVENOUS

## 2018-05-31 MED ORDER — ACETAMINOPHEN 10 MG/ML IV SOLN
1000.0000 mg | Freq: Once | INTRAVENOUS | Status: DC | PRN
  Administered 2018-05-31: 1000 mg via INTRAVENOUS

## 2018-05-31 MED ORDER — SODIUM CHLORIDE (PF) 0.9 % IJ SOLN
INTRAMUSCULAR | Status: DC | PRN
  Administered 2018-05-31: 20 mL

## 2018-05-31 MED ORDER — PROPOFOL 10 MG/ML IV BOLUS
INTRAVENOUS | Status: AC
  Filled 2018-05-31: qty 40

## 2018-05-31 MED ORDER — HYDROMORPHONE HCL 1 MG/ML IJ SOLN: 0.5000 mg | INTRAMUSCULAR | Status: DC | PRN

## 2018-05-31 MED ORDER — METHOCARBAMOL 500 MG IVPB - SIMPLE MED
INTRAVENOUS | Status: AC
  Filled 2018-05-31: qty 50

## 2018-05-31 MED ORDER — DEXAMETHASONE SODIUM PHOSPHATE 10 MG/ML IJ SOLN
INTRAMUSCULAR | Status: AC
  Filled 2018-05-31: qty 1

## 2018-05-31 MED ORDER — SENNOSIDES-DOCUSATE SODIUM 8.6-50 MG PO TABS
1.0000 | ORAL_TABLET | Freq: Every evening | ORAL | Status: DC | PRN
  Administered 2018-06-02: 1 via ORAL
  Filled 2018-05-31: qty 1

## 2018-05-31 MED ORDER — DEXAMETHASONE SODIUM PHOSPHATE 10 MG/ML IJ SOLN
INTRAMUSCULAR | Status: DC | PRN
  Administered 2018-05-31: 10 mg via INTRAVENOUS

## 2018-05-31 MED ORDER — SODIUM CHLORIDE 0.9 % IR SOLN
Status: DC | PRN
  Administered 2018-05-31: 1000 mL

## 2018-05-31 MED ORDER — ONDANSETRON HCL 4 MG/2ML IJ SOLN
INTRAMUSCULAR | Status: DC | PRN
  Administered 2018-05-31: 4 mg via INTRAVENOUS

## 2018-05-31 SURGICAL SUPPLY — 58 items
ACETAB SHELL 46 (Head) ×3 IMPLANT
AML 10.5 STD 6.3 5/8 SML 6IN ×3 IMPLANT
BAG DECANTER FOR FLEXI CONT (MISCELLANEOUS) ×2 IMPLANT
BAG ZIPLOCK 12X15 (MISCELLANEOUS) ×3 IMPLANT
BLADE SAW SAG 73X25 THK (BLADE) ×1
BLADE SAW SGTL 73X25 THK (BLADE) ×2 IMPLANT
BLADE SURG SZ10 CARB STEEL (BLADE) ×6 IMPLANT
BNDG COHESIVE 6X5 TAN STRL LF (GAUZE/BANDAGES/DRESSINGS) ×3 IMPLANT
COVER BACK TABLE 60X90IN (DRAPES) ×2 IMPLANT
COVER BACK TABLE 80X110 HD (DRAPES) ×3 IMPLANT
COVER SURGICAL LIGHT HANDLE (MISCELLANEOUS) ×3 IMPLANT
COVER WAND RF STERILE (DRAPES) ×2 IMPLANT
DECANTER SPIKE VIAL GLASS SM (MISCELLANEOUS) ×2 IMPLANT
DRAPE INCISE IOBAN 66X45 STRL (DRAPES) ×3 IMPLANT
DRAPE ORTHO SPLIT 77X108 STRL (DRAPES) ×4
DRAPE POUCH INSTRU U-SHP 10X18 (DRAPES) ×3 IMPLANT
DRAPE SURG ORHT 6 SPLT 77X108 (DRAPES) ×2 IMPLANT
DRAPE U-SHAPE 47X51 STRL (DRAPES) ×3 IMPLANT
DRESSING ADAPTIC 1/2  N-ADH (PACKING) ×1 IMPLANT
DRSG EMULSION OIL 3X16 NADH (GAUZE/BANDAGES/DRESSINGS) ×3 IMPLANT
ELECT REM PT RETURN 15FT ADLT (MISCELLANEOUS) ×3 IMPLANT
EVACUATOR 1/8 PVC DRAIN (DRAIN) ×1 IMPLANT
GAUZE SPONGE 4X4 12PLY STRL (GAUZE/BANDAGES/DRESSINGS) ×3 IMPLANT
GLOVE BIO SURGEON STRL SZ 6.5 (GLOVE) ×1 IMPLANT
GLOVE BIO SURGEONS STRL SZ 6.5 (GLOVE) ×1
GLOVE BIOGEL PI IND STRL 6.5 (GLOVE) IMPLANT
GLOVE BIOGEL PI IND STRL 7.0 (GLOVE) IMPLANT
GLOVE BIOGEL PI IND STRL 8 (GLOVE) ×5 IMPLANT
GLOVE BIOGEL PI INDICATOR 6.5 (GLOVE) ×2
GLOVE BIOGEL PI INDICATOR 7.0 (GLOVE) ×6
GLOVE BIOGEL PI INDICATOR 8 (GLOVE) ×4
GLOVE SURG ORTHO 8.0 STRL STRW (GLOVE) ×3 IMPLANT
GLOVE SURG SS PI 7.5 STRL IVOR (GLOVE) ×2 IMPLANT
GOWN BRE IMP SLV SIRUS LXLNG (GOWN DISPOSABLE) ×2 IMPLANT
GOWN SPEC L4 XLG W/TWL (GOWN DISPOSABLE) ×2 IMPLANT
GOWN STRL REUS W/TWL XL LVL3 (GOWN DISPOSABLE) ×6 IMPLANT
HEAD CERAMIC DELTA 28 P1.5 HIP (Head) ×2 IMPLANT
HIP AML 10.5 STD 6.3 5/8SML6IN IMPLANT
HOLDER FOLEY CATH W/STRAP (MISCELLANEOUS) ×2 IMPLANT
HOOD PEEL AWAY FLYTE STAYCOOL (MISCELLANEOUS) ×2 IMPLANT
IMMOBILIZER KNEE 20 (SOFTGOODS) ×3
IMMOBILIZER KNEE 20 THIGH 36 (SOFTGOODS) IMPLANT
LINER ACET ALTRX +4 28X46 0D (Hips) ×2 IMPLANT
MANIFOLD NEPTUNE II (INSTRUMENTS) ×3 IMPLANT
NS IRRIG 1000ML POUR BTL (IV SOLUTION) ×3 IMPLANT
PACK ANTERIOR HIP CUSTOM (KITS) ×3 IMPLANT
PAD ABD 8X10 STRL (GAUZE/BANDAGES/DRESSINGS) ×6 IMPLANT
PROTECTOR NERVE ULNAR (MISCELLANEOUS) ×3 IMPLANT
SHELL ACETAB 46 (Head) IMPLANT
STAPLER VISISTAT 35W (STAPLE) ×3 IMPLANT
SUT ETHIBOND NAB CT1 #1 30IN (SUTURE) ×12 IMPLANT
SUT VIC AB 0 CT1 36 (SUTURE) ×6 IMPLANT
SUT VIC AB 2-0 CT1 27 (SUTURE) ×4
SUT VIC AB 2-0 CT1 TAPERPNT 27 (SUTURE) ×2 IMPLANT
TOWEL OR 17X26 10 PK STRL BLUE (TOWEL DISPOSABLE) ×3 IMPLANT
TRAY FOLEY CATH 14FRSI W/METER (CATHETERS) ×2 IMPLANT
WATER STERILE IRR 1000ML POUR (IV SOLUTION) ×5 IMPLANT
YANKAUER SUCT BULB TIP 10FT TU (MISCELLANEOUS) ×3 IMPLANT

## 2018-05-31 NOTE — Interval H&P Note (Signed)
History and Physical Interval Note:  05/31/2018 7:26 AM  Rachel Austin  has presented today for surgery, with the diagnosis of OA RIGHT HIP  The various methods of treatment have been discussed with the patient and family. After consideration of risks, benefits and other options for treatment, the patient has consented to  Procedure(s): TOTAL HIP ARTHROPLASTY (Right) as a surgical intervention .  The patient's history has been reviewed, patient examined, no change in status, stable for surgery.  I have reviewed the patient's chart and labs.  Questions were answered to the patient's satisfaction.     Yvette Rack

## 2018-05-31 NOTE — Discharge Instructions (Signed)

## 2018-05-31 NOTE — Brief Op Note (Signed)
05/31/2018  10:01 AM  PATIENT:  Rachel Austin  63 y.o. female  PRE-OPERATIVE DIAGNOSIS:  OA RIGHT HIP  POST-OPERATIVE DIAGNOSIS:  OA RIGHT HIP  PROCEDURE:  Procedure(s): TOTAL HIP ARTHROPLASTY (Right)  SURGEON:  Surgeon(s) and Role:    [name], MD - Primary  PHYSICIAN ASSISTANT: Chriss Czar, PA-C  ASSISTANTS: OR staff x1   ANESTHESIA:   local and general  EBL:  650 mL   BLOOD ADMINISTERED:none  DRAINS: none   LOCAL MEDICATIONS USED:  MARCAINE     SPECIMEN:  No Specimen  DISPOSITION OF SPECIMEN:  N/A  COUNTS:  YES  TOURNIQUET:  * No tourniquets in log *  DICTATION: .Other Dictation: Dictation Number unknown  PLAN OF CARE: Admit to inpatient   PATIENT DISPOSITION:  PACU - hemodynamically stable.   Delay start of Pharmacological VTE agent (>24hrs) due to surgical blood loss or risk of bleeding: yes

## 2018-05-31 NOTE — Plan of Care (Signed)

## 2018-05-31 NOTE — Anesthesia Procedure Notes (Signed)
Procedure Name: MAC Date/Time: 05/31/2018 7:41 AM Performed by: Deliah Boston, CRNA Pre-anesthesia Checklist: Patient identified, Emergency Drugs available, Suction available, Patient being monitored and Timeout performed Patient Re-evaluated:Patient Re-evaluated prior to induction Oxygen Delivery Method: Simple face mask Preoxygenation: Pre-oxygenation with 100% oxygen Placement Confirmation: CO2 detector and breath sounds checked- equal and bilateral

## 2018-05-31 NOTE — Op Note (Signed)
Rachel Austin, CLEVINGER MEDICAL RECORD JK:[id number] ACCOUNT [id number] DATE OF BIRTH:1955/11/15 FACILITY: [place] LOCATION: [id number] PHYSICIAN:W. Teyanna Thielman JR., MD  OPERATIVE REPORT  DATE OF PROCEDURE:  05/31/2018  PREOPERATIVE DIAGNOSIS:  Severe rheumatoid arthritis of right hip.  POSTOPERATIVE DIAGNOSIS:  Severe rheumatoid arthritis of right hip.  OPERATION:  Right total hip replacement (DePuy AML 10.5 small stature stem, +1.5 mm neck length with 28 mm Biolox delta ceramic femoral head, acetabular size 46 mm Pinnacle Gription acetabular shell with AltrX polyethylene acetabular lining +4 mm 10  degree.  SURGEON:  W. Carmine Savoy., MD  ASSISTANTMarjo Bicker.  ESTIMATED BLOOD LOSS:  650.  DESCRIPTION OF PROCEDURE:  Lateral position posterior approach to the hip was made, split the iliotibial band and gluteus maximus fascia, split the short external rotators and a T capsulotomy was made in the hip.  Significant synovial hypertrophy  consistent with rheumatoid arthritis.  Extensive synovectomy was carried out around the hip.  Femoral head was cut somewhat high due to the desired restored length.  There was severe erosion of the acetabulum with more central and posterior erosion than  typical.  We progressively broached actually up to the smallest broach possible due to the patient's small stature using the 10 mm small stature broach.  Attention was next directed to the acetabulum.  Acetabular retractors were placed anteriorly and  inferiorly with wing retractors posteriorly and posterior superiorly.  Again, the severe erosion was noted.  We centralized the acetabulum somewhat and then progressively reamed for 1 mm under reaming 45 mm to accept a 46 mm cup.  The Pinnacle Gription  cup was inserted with about 40-45 degrees of abduction, 10-15 degrees of anteversion with the trial liner and then trialed off the broach.  We did have to do a second provisional cut on the femoral neck to be  able to reduce the hip.  However, once this  was done, the stability was noted to be excellent.  Despite the need to use a small hip ball, we were very stable with flexion to greater than 120 adduction full and internal rotation to about 80 degrees before there was any tendency to dislocate the  hip.  Trials were removed.  The final cup liner was placed with the posterior offset the buildup at about the 9 o'clock position on the right hip.  The final prosthesis inserted with ceramic hip ball.  Again, all parameters deemed to be acceptable.  The  split in the iliotibial band maximus fascia was closed with a running Ethibond and the T capsulotomy was closed with Ethibond, subcutaneous tissues with 0, 2-0 Vicryl and skin with skin clips.  A lightly compressive sterile dressing applied, placed in a  knee immobilizer.  taken to recovery room in stable condition.  TN/NUANCE  D:05/31/2018 T:05/31/2018 JOB:[id number]

## 2018-05-31 NOTE — Evaluation (Signed)
Physical Therapy Evaluation Patient Details Name: Rachel Austin MRN: [id number] DOB: [birth year] Today's Date: 05/31/2018   History of Present Illness  Pt s/p R THR and with hx of RA, bilat TKR  Clinical Impression  Pt s/p R THR and presents with decreased R LE strength/ROM, post op pain, and posterior THP limiting functional mobility.  Pt hopes to progress to dc home with family assist and HHPT follow up..    Follow Up Recommendations Home health PT;Follow surgeon's recommendation for DC plan and follow-up therapies    Equipment Recommendations  Rolling walker with 5" wheels([employer])    Recommendations for Other Services OT consult     Precautions / Restrictions Precautions Precautions: Posterior Hip;Fall Precaution Booklet Issued: Yes (comment) Precaution Comments: Reviewed with pt and family and provided spanish notes Restrictions Weight Bearing Restrictions: No RLE Weight Bearing: Weight bearing as tolerated      Mobility  Bed Mobility Overal bed mobility: Needs Assistance Bed Mobility: Supine to Sit;Sit to Supine     Supine to sit: +2 for safety/equipment;+2 for physical assistance;Mod assist Sit to supine: Mod assist;Max assist;+2 for physical assistance;+2 for safety/equipment   General bed mobility comments: cues for sequence and use of L LE to self assist  Transfers                 General transfer comment: NT - pt with c/o dizziness sitting EOB  Ambulation/Gait                Stairs            Wheelchair Mobility    Modified Rankin (Stroke Patients Only)       Balance Overall balance assessment: Needs assistance Sitting-balance support: No upper extremity supported;Feet supported Sitting balance-Leahy Scale: Fair                                       Pertinent Vitals/Pain Pain Assessment: 0-10 Pain Score: 3  Pain Location: R hip Pain Descriptors / Indicators: Aching;Sore Pain Intervention(s): Limited  activity within patient's tolerance;Monitored during session;Premedicated before session;Ice applied    Home Living Family/patient expects to be discharged to:: Private residence Living Arrangements: Children Available Help at Discharge: Family Type of Home: House Home Access: Stairs to enter Entrance Stairs-Rails: None Technical brewer of Steps: 1+1 Home Layout: One level Home Equipment: None      Prior Function Level of Independence: Independent               Hand Dominance        Extremity/Trunk Assessment   Upper Extremity Assessment Upper Extremity Assessment: Generalized weakness;RUE deficits/detail;LUE deficits/detail RUE Deficits / Details: rheumatoid arthritis related deficits LUE Deficits / Details: Rheumatoid arthritis related deficits    Lower Extremity Assessment Lower Extremity Assessment: RLE deficits/detail       Communication   Communication: No difficulties  Cognition Arousal/Alertness: Awake/alert Behavior During Therapy: WFL for tasks assessed/performed Overall Cognitive Status: Within Functional Limits for tasks assessed                                        General Comments      Exercises     Assessment/Plan    PT Assessment Patient needs continued PT services  PT Problem List Decreased strength;Decreased range of motion;Decreased activity tolerance;Decreased balance;Decreased mobility;Decreased knowledge of  use of DME;Pain       PT Treatment Interventions DME instruction;Gait training;Stair training;Functional mobility training;Therapeutic activities;Therapeutic exercise;Patient/family education    PT Goals (Current goals can be found in the Care Plan section)  Acute Rehab PT Goals Patient Stated Goal: Regain IND PT Goal Formulation: With patient Time For Goal Achievement: 06/07/18 Potential to Achieve Goals: Good    Frequency 7X/week   Barriers to discharge        Co-evaluation                AM-PAC PT "6 Clicks" Mobility  Outcome Measure Help needed turning from your back to your side while in a flat bed without using bedrails?: A Lot Help needed moving from lying on your back to sitting on the side of a flat bed without using bedrails?: A Lot Help needed moving to and from a bed to a chair (including a wheelchair)?: A Lot Help needed standing up from a chair using your arms (e.g., wheelchair or bedside chair)?: A Lot Help needed to walk in hospital room?: Total Help needed climbing 3-5 steps with a railing? : Total 6 Click Score: 10    End of Session Equipment Utilized During Treatment: Gait belt Activity Tolerance: Patient limited by fatigue Patient left: in bed;with call bell/phone within reach;with family/visitor present;with bed alarm set Nurse Communication: Mobility status PT Visit Diagnosis: Difficulty in walking, not elsewhere classified (R26.2)    Time: 5701-7793 PT Time Calculation (min) (ACUTE ONLY): 25 min   Charges:   PT Evaluation $PT Eval Low Complexity: 1 Low          [place] Pager [phone] Office [phone]   Dana Debo 05/31/2018, 5:40 PM

## 2018-05-31 NOTE — Anesthesia Postprocedure Evaluation (Signed)
Anesthesia Post Note  Patient: Rachel Austin  Procedure(s) Performed: TOTAL HIP ARTHROPLASTY (Right Hip)     Patient location during evaluation: PACU Anesthesia Type: General Level of consciousness: awake and alert Pain management: pain level controlled Vital Signs Assessment: post-procedure vital signs reviewed and stable Respiratory status: spontaneous breathing, nonlabored ventilation, respiratory function stable and patient connected to nasal cannula oxygen Cardiovascular status: blood pressure returned to baseline and stable Postop Assessment: no apparent nausea or vomiting Anesthetic complications: no    Last Vitals:  Vitals:   05/31/18 1346 05/31/18 1442  BP: 116/73 99/65  Pulse: 60 63  Resp: 16 16  Temp: 36.7 C 36.8 C  SpO2: 100% 100%    Last Pain:  Vitals:   05/31/18 1442  TempSrc: Oral  PainSc:                  Barnet [place]

## 2018-05-31 NOTE — Transfer of Care (Signed)
Immediate Anesthesia Transfer of Care Note  Patient: Rachel Austin  Procedure(s) Performed: Procedure(s): TOTAL HIP ARTHROPLASTY (Right)  Patient Location: PACU  Anesthesia Type:General  Level of Consciousness: Patient easily awoken, sedated, comfortable, cooperative, following commands, responds to stimulation.   Airway & Oxygen Therapy: Patient spontaneously breathing, ventilating well, oxygen via simple oxygen mask.  Post-op Assessment: Report given to PACU RN, vital signs reviewed and stable, moving all extremities.   Post vital signs: Reviewed and stable.  Complications: No apparent anesthesia complications Last Pain:  Vitals:   05/31/18 0621  PainSc: 3       Patients Stated Pain Goal: 3 (41/74/08 1448)  Complications: No apparent anesthesia complications

## 2018-05-31 NOTE — Anesthesia Procedure Notes (Signed)
Procedure Name: Intubation Date/Time: 05/31/2018 7:54 AM Performed by: Deliah Boston, CRNA Pre-anesthesia Checklist: Patient identified, Emergency Drugs available, Suction available and Patient being monitored Patient Re-evaluated:Patient Re-evaluated prior to induction Oxygen Delivery Method: Circle system utilized Preoxygenation: Pre-oxygenation with 100% oxygen Induction Type: IV induction Ventilation: Mask ventilation without difficulty Laryngoscope Size: Mac and 3 Grade View: Grade I Tube type: Oral Tube size: 7.0 mm Number of attempts: 1 Airway Equipment and Method: Stylet and Oral airway Placement Confirmation: ETT inserted through vocal cords under direct vision,  positive ETCO2 and breath sounds checked- equal and bilateral Secured at: 20 cm Tube secured with: Tape Dental Injury: Teeth and Oropharynx as per pre-operative assessment

## 2018-06-01 ENCOUNTER — Encounter (HOSPITAL_COMMUNITY): Payer: Self-pay | Admitting: Physician Assistant

## 2018-06-01 DIAGNOSIS — D62 Acute posthemorrhagic anemia: Secondary | ICD-10-CM | POA: Diagnosis not present

## 2018-06-01 DIAGNOSIS — D509 Iron deficiency anemia, unspecified: Secondary | ICD-10-CM

## 2018-06-01 HISTORY — DX: Acute posthemorrhagic anemia: D62

## 2018-06-01 HISTORY — DX: Iron deficiency anemia, unspecified: D50.9

## 2018-06-01 LAB — CBC
HCT: 29.3 % — ABNORMAL LOW (ref 36.0–46.0)
Hemoglobin: 9.2 g/dL — ABNORMAL LOW (ref 12.0–15.0)
MCH: 27.3 pg (ref 26.0–34.0)
MCHC: 31.4 g/dL (ref 30.0–36.0)
MCV: 86.9 fL (ref 80.0–100.0)
Platelets: 270 10*3/uL (ref 150–400)
RBC: 3.37 MIL/uL — ABNORMAL LOW (ref 3.87–5.11)
RDW: 14.4 % (ref 11.5–15.5)
WBC: 12.2 10*3/uL — ABNORMAL HIGH (ref 4.0–10.5)
nRBC: 0 % (ref 0.0–0.2)

## 2018-06-01 LAB — BASIC METABOLIC PANEL
Anion gap: 6 (ref 5–15)
BUN: 12 mg/dL (ref 8–23)
CHLORIDE: 103 mmol/L (ref 98–111)
CO2: 29 mmol/L (ref 22–32)
Calcium: 8.8 mg/dL — ABNORMAL LOW (ref 8.9–10.3)
Creatinine, Ser: 0.48 mg/dL (ref 0.44–1.00)
GFR calc Af Amer: >60 mL/min (ref >60)
GFR calc non Af Amer: >60 mL/min (ref >60)
Glucose, Bld: 125 mg/dL — ABNORMAL HIGH (ref 70–99)
Potassium: 3.6 mmol/L (ref 3.5–5.1)
Sodium: 138 mmol/L (ref 135–145)

## 2018-06-01 MED ORDER — BISOPROLOL FUMARATE 5 MG PO TABS
5.0000 mg | ORAL_TABLET | Freq: Every day | ORAL | Status: DC
  Administered 2018-06-01 – 2018-06-02 (×2): 5 mg via ORAL
  Filled 2018-06-01 (×2): qty 1

## 2018-06-01 NOTE — Evaluation (Signed)
Occupational Therapy Evaluation Patient Details Name: Rachel Austin MRN: [id number] DOB: [birth year] Today's Date: 06/01/2018    History of Present Illness Pt s/p R THR and with hx of RA, bilat TKR   Clinical Impression   This 63 year old female was admitted for the above sx.  Pt has posterior THPS on R side and is WBAT.  She needs a lot of assistance for bed mobility and to stand. She is unable to use most AE due to RA, but daughter can assist her at home. Daughter does work, but will stay with her initially.  Priority is safe toileting so that she can get to the point of needing intermittent assistance. Will follow in acute setting with min A level goals     Follow Up Recommendations  Supervision/Assistance - 24 hour;SNF    Equipment Recommendations  3 in 1 bedside commode    Recommendations for Other Services       Precautions / Restrictions Precautions Precautions: Posterior Hip;Fall Precaution Comments: reviewed with pt and daughter Restrictions RLE Weight Bearing: Weight bearing as tolerated      Mobility Bed Mobility         Supine to sit: Mod assist;Max assist     General bed mobility comments: assist for RLE and a lot of assistance for trunk to sit up  Transfers Overall transfer level: Needs assistance Equipment used: Rolling walker (2 wheeled) Transfers: Sit to/from [facility] Sit to Stand: Max assist Stand pivot transfers: Min assist       General transfer comment: max A to power up to standing.  Min, steadying A for SPT    Balance                                           ADL either performed or assessed with clinical judgement   ADL Overall ADL's : Needs assistance/impaired Eating/Feeding: Set up   Grooming: Set up;Sitting   Upper Body Bathing: Set up   Lower Body Bathing: Maximal assistance;Sit to/from stand   Upper Body Dressing : Minimal assistance;Sitting   Lower Body Dressing: Total  assistance;Sit to/from stand   Toilet Transfer: Stand-pivot(max A for sit to stand; min A pivot)   Toileting- Clothing Manipulation and Hygiene: Maximal assistance;Sit to/from stand         General ADL Comments: Educated on precautions. Pt has small hands and cannot use reacher for adls. Daughter can assist. Pt could use a long sponge. Pt is used to using momentum to get up and daughter assisted by standing in front of her. Educated that standing to side would be better and encouraging pt to stand straight up rather than leaning forward due to precautions. Pt tends to reach up for walker to stand despite starting with hand on bed to push.  Recommended pt sponge bathe initially; fatiques easily.  [place] therapist can look at shower/3:1     Vision         Perception     Praxis      Pertinent Vitals/Pain Pain Score: 5  Pain Location: R hip with weight bearing Pain Descriptors / Indicators: Aching;Sore Pain Intervention(s): Limited activity within patient's tolerance;Monitored during session;Premedicated before session;Repositioned;Ice applied     Hand Dominance     Extremity/Trunk Assessment Upper Extremity Assessment RUE Deficits / Details: rheumatoid arthritis related deficits.  Decreased hand movement especially thumb; limited shoulder ROM, approximately 20  FF LUE Deficits / Details: RA; can lift arm to 90; shoulder strength 3+/5           Communication Communication Communication: Prefers language other than English(daughter interpreted)   Cognition Arousal/Alertness: Awake/alert Behavior During Therapy: WFL for tasks assessed/performed Overall Cognitive Status: Within Functional Limits for tasks assessed                                     General Comments       Exercises     Shoulder Instructions      Home Living Family/patient expects to be discharged to:: Private residence Living Arrangements: Children Available Help at Discharge: Family                Bathroom Shower/Tub: Walk-in shower(very small stand up shower)   Bathroom Toilet: Standard     Home Equipment: None          Prior Functioning/Environment Level of Independence: Independent                 OT Problem List: Decreased strength;Decreased activity tolerance;Impaired balance (sitting and/or standing);Decreased knowledge of use of DME or AE;Decreased knowledge of precautions;Pain      OT Treatment/Interventions: Self-care/ADL training;DME and/or AE instruction;Patient/family education;Balance training;Therapeutic activities    OT Goals(Current goals can be found in the care plan section) Acute Rehab OT Goals Patient Stated Goal: Regain IND OT Goal Formulation: With patient/family Time For Goal Achievement: 06/15/18 Potential to Achieve Goals: Good ADL Goals Pt Will Transfer to Toilet: bedside commode;ambulating;stand pivot transfer;with min assist Pt Will Perform Toileting - Clothing Manipulation and hygiene: with min assist;sit to/from stand Additional ADL Goal #1: pt will go from sit to stand with min A for adls  OT Frequency: Min 2X/week   Barriers to D/C:            Co-evaluation              AM-PAC OT "6 Clicks" Daily Activity     Outcome Measure Help from another person eating meals?: A Little Help from another person taking care of personal grooming?: A Little Help from another person toileting, which includes using toliet, bedpan, or urinal?: A Lot Help from another person bathing (including washing, rinsing, drying)?: A Lot Help from another person to put on and taking off regular upper body clothing?: A Little Help from another person to put on and taking off regular lower body clothing?: Total 6 Click Score: 14   End of Session    Activity Tolerance: Patient limited by fatigue Patient left: in chair;with call bell/phone within reach;with family/visitor present  OT Visit Diagnosis: Pain;Muscle weakness (generalized)  (M62.81) Pain - Right/Left: Right Pain - part of body: Hip                Time: 4081-4481 OT Time Calculation (min): 43 min Charges:  OT General Charges $OT Visit: 1 Visit OT Evaluation $OT Eval Low Complexity: 1 Low OT Treatments $Self Care/Home Management : 23-37 mins  Lesle Chris, OTR/L Acute Rehabilitation Services [phone] [facility] pager [phone] office 06/01/2018  [place] 06/01/2018, 12:43 PM

## 2018-06-01 NOTE — Progress Notes (Signed)
Pt received 2 units of blood. No adverse reaction, tolerated well.

## 2018-06-01 NOTE — Plan of Care (Signed)

## 2018-06-01 NOTE — Progress Notes (Signed)
PHYSICAL THERAPY  PM session.  Pt just got back to bed and sleeping. Daughter went home and due back later.   Rica Koyanagi  PTA Acute  Rehabilitation Services Pager      [phone] Office      [phone]

## 2018-06-01 NOTE — Care Management Note (Signed)
Case Management Note  Patient Details  Name: Rachel Austin MRN: [id number] Date of Birth: 01/23/1956  Subjective/Objective:  S/p R THR                   Action/Plan: NCM spoke to pt and dtr at bedside. Dtr able to translate. Offered choice for HH/CMS list provided/placed in chart. Pt agreeable to [facility] for [place]. Contacted [facility] for RW and 3n1 bedside commode to be delivered to room prior to dc.   Expected Discharge Date:                  Expected Discharge Plan:  [place]  In-House Referral:  NA  Discharge planning Services  CM Consult  Post Acute Care Choice:  Home Health Choice offered to:  Adult Children  DME Arranged:  3-N-1, [place] [facility] DME Agency:  [place] Arranged:  PT [place] Agency:  [facility]  Status of Service:  Completed, signed off  If discussed at [employer], dates discussed:    Additional Comments:  Erenest Rasher, RN 06/01/2018, 2:16 PM

## 2018-06-01 NOTE — Progress Notes (Signed)
Subjective: 1 Day Post-Op Procedure(s) (LRB): TOTAL HIP ARTHROPLASTY (Right) Patient reports pain as 2 on 0-10 scale.    Objective: Vital signs in last 24 hours: Temp:  [97.6 F (36.4 C)-98.6 F (37 C)] 98.5 F (36.9 C) (01/11 0421) Pulse Rate:  [51-86] 64 (01/11 0421) Resp:  [12-21] 14 (01/11 0421) BP: (79-148)/(50-101) 101/56 (01/11 0421) SpO2:  [99 %-100 %] 100 % (01/11 0421)  Intake/Output from previous day: 01/10 0701 - 01/11 0700 In: 5343.2 [P.O.:240; I.V.:4061.2; Blood:762; IV Piggyback:280] Out: 3550 [Urine:2900; Blood:650] Intake/Output this shift: No intake/output data recorded.  Recent Labs    05/31/18 0849 05/31/18 1908 06/01/18 0530  HGB 9.1* 7.0* 9.2*   Recent Labs    05/31/18 0849 05/31/18 1908 06/01/18 0530  WBC 7.5  --  12.2*  RBC 3.42*  --  3.37*  HCT 30.0* 23.5* 29.3*  PLT 383  --  270   Recent Labs    06/01/18 0530  NA 138  K 3.6  CL 103  CO2 29  BUN 12  CREATININE 0.48  GLUCOSE 125*  CALCIUM 8.8*   Recent Labs    05/31/18 0849  INR 1.18    ABD soft Neurovascular intact Sensation intact distally Intact pulses distally Dorsiflexion/Plantar flexion intact Incision: dressing C/D/I  Anticipated LOS equal to or greater than 2 midnights due to - Age 19 and older with one or more of the following:  - Obesity  - Expected need for hospital services (PT, OT, Nursing) required for safe  discharge  - Anticipated need for postoperative skilled nursing care or inpatient rehab  - Active co-morbidities: Anemia OR   - Unanticipated findings during/Post Surgery: Slow post-op progression: GI, pain control, mobility  - Patient is a high risk of re-admission due to: None   Assessment/Plan: 1 Day Post-Op Procedure(s) (LRB): TOTAL HIP ARTHROPLASTY (Right)  Active Problems:   Degenerative joint disease of right hip   Acute blood loss anemia   Anemia, iron deficiency  Patient's blood pressure dropped last night and hgb went to 7.0   She  was given one 500 cc fluid bolus and 2 units of packed red blood cells.  Hgb is up to 9.2 today   Will watch how she does with therapy and labs over night.  Plan on discharge in the am if she progresses well.   Up with therapy Plan for discharge tomorrow Discharge home with home health    Rachel Austin 06/01/2018, 8:26 AM

## 2018-06-01 NOTE — Progress Notes (Signed)
Physical Therapy Treatment Patient Details Name: Rachel Austin MRN: [id number] DOB: Mar 19, 1956 Today's Date: 06/01/2018    History of Present Illness Pt s/p R THR and with hx of RA, bilat TKR    PT Comments    POD # 1 am session Assisted pt OOB to amb + 2 assist for safety and recliner following.  Returned to room and Performed some TE's following HEP handout.  Instructed on proper tech, freq as well as use of ICE.     Follow Up Recommendations  Home health PT;Follow surgeon's recommendation for DC plan and follow-up therapies     Equipment Recommendations  Rolling walker with 5" wheels    Recommendations for Other Services       Precautions / Restrictions Precautions Precautions: Posterior Hip;Fall Precaution Comments: reviewed with pt and daughter Restrictions Weight Bearing Restrictions: No RLE Weight Bearing: Weight bearing as tolerated    Mobility  Bed Mobility Overal bed mobility: Needs Assistance Bed Mobility: Supine to Sit     Supine to sit: Mod assist     General bed mobility comments: assist for RLE and upper body  Transfers Overall transfer level: Needs assistance Equipment used: Rolling walker (2 wheeled) Transfers: Sit to/from [facility] Sit to Stand: Max assist Stand pivot transfers: Mod assist       General transfer comment: max A to power up to standing.  Mod to complete turns.    Ambulation/Gait Ambulation/Gait assistance: Min assist;Mod assist Gait Distance (Feet): 37 Feet Assistive device: Rolling walker (2 wheeled) Gait Pattern/deviations: Step-to pattern;Decreased step length - left;Decreased step length - right;Decreased stance time - right Gait velocity: decreased    General Gait Details: assist for posterior lean and assist to navigate walker due to B hand RA.     Stairs             Wheelchair Mobility    Modified Rankin (Stroke Patients Only)       Balance                                            Cognition Arousal/Alertness: Awake/alert Behavior During Therapy: WFL for tasks assessed/performed Overall Cognitive Status: Within Functional Limits for tasks assessed                                 General Comments: daughter present during session to interpret      Exercises   Total Hip Replacement TE's 10 reps ankle pumps 10 reps knee presses 10 reps heel slides 10 reps SAQ's 10 reps ABD Followed by ICE    General Comments        Pertinent Vitals/Pain Pain Assessment: Faces Faces Pain Scale: Hurts little more Pain Location: R hip with weight bearing Pain Descriptors / Indicators: Aching;Sore;Operative site guarding Pain Intervention(s): Monitored during session;Repositioned;Premedicated before session;Ice applied    Home Living                      Prior Function            PT Goals (current goals can now be found in the care plan section) Progress towards PT goals: Progressing toward goals    Frequency    7X/week      PT Plan Current plan remains appropriate    Co-evaluation  AM-PAC PT "6 Clicks" Mobility   Outcome Measure  Help needed turning from your back to your side while in a flat bed without using bedrails?: A Lot Help needed moving from lying on your back to sitting on the side of a flat bed without using bedrails?: A Lot Help needed moving to and from a bed to a chair (including a wheelchair)?: A Lot Help needed standing up from a chair using your arms (e.g., wheelchair or bedside chair)?: A Lot Help needed to walk in hospital room?: A Lot Help needed climbing 3-5 steps with a railing? : A Lot 6 Click Score: 12    End of Session Equipment Utilized During Treatment: Gait belt Activity Tolerance: Patient limited by fatigue Patient left: in chair;with call bell/phone within reach;with chair alarm set   PT Visit Diagnosis: Difficulty in walking, not elsewhere classified  (R26.2)     Time: 1135-1200 PT Time Calculation (min) (ACUTE ONLY): 25 min  Charges:  $Gait Training: 8-22 mins $Therapeutic Exercise: 8-22 mins                     Rica Koyanagi  PTA Acute  Rehabilitation Services Pager      [phone] Office      [phone]

## 2018-06-02 ENCOUNTER — Other Ambulatory Visit: Payer: Self-pay | Admitting: Orthopedic Surgery

## 2018-06-02 LAB — CBC
HCT: 29.2 % — ABNORMAL LOW (ref 36.0–46.0)
Hemoglobin: 9 g/dL — ABNORMAL LOW (ref 12.0–15.0)
MCH: 27.6 pg (ref 26.0–34.0)
MCHC: 30.8 g/dL (ref 30.0–36.0)
MCV: 89.6 fL (ref 80.0–100.0)
Platelets: 239 10*3/uL (ref 150–400)
RBC: 3.26 MIL/uL — ABNORMAL LOW (ref 3.87–5.11)
RDW: 15.1 % (ref 11.5–15.5)
WBC: 9.7 10*3/uL (ref 4.0–10.5)
nRBC: 0 % (ref 0.0–0.2)

## 2018-06-02 MED ORDER — POLYETHYLENE GLYCOL 3350 17 G PO PACK
17.0000 g | PACK | Freq: Two times a day (BID) | ORAL | Status: DC
  Administered 2018-06-02: 17 g via ORAL
  Filled 2018-06-02: qty 1

## 2018-06-02 MED ORDER — POLYETHYLENE GLYCOL 3350 17 G PO PACK: PACK | ORAL | 0 refills | Status: DC

## 2018-06-02 MED ORDER — OXYCODONE HCL 5 MG PO TABS: 5.0000 mg | ORAL_TABLET | ORAL | 0 refills | Status: AC | PRN

## 2018-06-02 MED ORDER — SODIUM CHLORIDE 0.9 % IV BOLUS
500.0000 mL | Freq: Once | INTRAVENOUS | Status: AC
  Administered 2018-06-02: 500 mL via INTRAVENOUS

## 2018-06-02 MED ORDER — FERROUS SULFATE DRIED ER 45 MG PO TBCR: 1.0000 | EXTENDED_RELEASE_TABLET | Freq: Two times a day (BID) | ORAL | 0 refills | Status: DC

## 2018-06-02 NOTE — Progress Notes (Signed)
Occupational Therapy Treatment Patient Details Name: Rachel Austin MRN: [id number] DOB: 30-May-1955 Today's Date: 06/02/2018    History of present illness Pt s/p R THR and with hx of RA, bilat TKR   OT comments  Patient progressing towards OT goals. Continue OT POC.  Follow Up Recommendations  Follow surgeon's recommendation for DC plan and follow-up therapies    Equipment Recommendations  3 in 1 bedside commode    Recommendations for Other Services      Precautions / Restrictions Precautions Precautions: Posterior Hip;Fall Precaution Booklet Issued: Yes (comment) Precaution Comments: reviewed with pt Restrictions RLE Weight Bearing: Weight bearing as tolerated       Mobility Bed Mobility Overal bed mobility: Needs Assistance Bed Mobility: Sit to Supine       Sit to supine: Min assist;+2 for physical assistance   General bed mobility comments: assist for RLE and upper body  Transfers Overall transfer level: Needs assistance Equipment used: Rolling walker (2 wheeled) Transfers: Sit to/from Stand Sit to Stand: Min assist         General transfer comment: assist to power up    Balance                                           ADL either performed or assessed with clinical judgement   ADL Overall ADL's : Needs assistance/impaired Eating/Feeding: Set up   Grooming: Wash/dry hands;Min Administrator Transfer: Min guard;Ambulation;RW;BSC   Toileting- Clothing Manipulation and Hygiene: Moderate assistance;Sit to/from stand         General ADL Comments: Reviewed precautions. Once standing, pt moving much better today with RW. Has difficulty with transfers and bed mobility, needs assistance. Very limited RUE ROM due to RA.     Vision       Perception     Praxis      Cognition Arousal/Alertness: Awake/alert Behavior During Therapy: WFL for tasks assessed/performed Overall Cognitive Status: Within  Functional Limits for tasks assessed                                          Exercises     Shoulder Instructions       General Comments      Pertinent Vitals/ Pain       Pain Assessment: Faces Faces Pain Scale: Hurts little more Pain Location: R hip with weight bearing Pain Descriptors / Indicators: Aching;Sore;Operative site guarding Pain Intervention(s): Limited activity within patient's tolerance;Monitored during session;Ice applied  Home Living                                          Prior Functioning/Environment              Frequency  Min 2X/week        Progress Toward Goals  OT Goals(current goals can now be found in the care plan section)  Progress towards OT goals: Progressing toward goals  Acute Rehab OT Goals Patient Stated Goal: [place] Discharge plan remains appropriate    Co-evaluation    PT/OT/SLP Co-Evaluation/Treatment: Yes Reason for Co-Treatment:  For patient/therapist safety;To address functional/ADL transfers PT goals addressed during session: Mobility/safety with mobility OT goals addressed during session: ADL's and self-care      AM-PAC OT "6 Clicks" Daily Activity     Outcome Measure   Help from another person eating meals?: A Little Help from another person taking care of personal grooming?: A Little Help from another person toileting, which includes using toliet, bedpan, or urinal?: A Lot Help from another person bathing (including washing, rinsing, drying)?: A Lot Help from another person to put on and taking off regular upper body clothing?: A Little Help from another person to put on and taking off regular lower body clothing?: Total 6 Click Score: 14    End of Session Equipment Utilized During Treatment: Rolling walker;Gait belt  OT Visit Diagnosis: Pain;Muscle weakness (generalized) (M62.81) Pain - Right/Left: Right Pain - part of body: Hip   Activity Tolerance Patient  tolerated treatment well   Patient Left in bed;with call bell/phone within reach;with bed alarm set   Nurse Communication Mobility status        Time: 2595-6387 OT Time Calculation (min): 14 min  Charges: OT General Charges $OT Visit: 1 Visit OT Treatments $Self Care/Home Management : 8-22 mins     Ciara Kagan A Alinda Egolf 06/02/2018, 12:15 PM

## 2018-06-02 NOTE — Progress Notes (Addendum)
Physical Therapy Treatment Patient Details Name: Rachel Austin MRN: [id number] DOB: [birth year] Today's Date: 06/02/2018    History of Present Illness Pt s/p R THR-posterior. hx of RA, bil TKR. Primary language is Spanish.     PT Comments    Progressing with mobility. Reviewed/practiced exercises, gait training, and stair training. Reviewed hip precautions with pt. All education completed. Okay to d/c from PT standpoint-made RN aware. Addendum: Daughter arrived later. Addressed stair negotiation and car transfer questions.  Encouraged pt to let RN know if she has any further questions prior to d/c. RN can page me.     Follow Up Recommendations  Follow surgeon's recommendation for DC plan and follow-up therapies  24 hour supervision/assist     Equipment Recommendations  Rolling walker with 5" wheels    Recommendations for Other Services       Precautions / Restrictions Precautions Precautions: Posterior Hip;Fall Precaution Booklet Issued: Yes (comment) Precaution Comments: reviewed with pt and daughter (daughter once she showed up later) Restrictions Weight Bearing Restrictions: No RLE Weight Bearing: Weight bearing as tolerated    Mobility  Bed Mobility Overal bed mobility: Needs Assistance Bed Mobility: Sit to Supine       Sit to supine: Mod assist   General bed mobility comments: Assist for R LE and some for trunk. Increased time.   Transfers Overall transfer level: Needs assistance Equipment used: Rolling walker (2 wheeled) Transfers: Sit to/from Stand Sit to Stand: Min assist         General transfer comment: Assist to power up and control descent. VCs safety, technique, hand/LE placement.   Ambulation/Gait Ambulation/Gait assistance: Min assist Gait Distance (Feet): 40 Feet Assistive device: Rolling walker (2 wheeled) Gait Pattern/deviations: Step-to pattern;Decreased step length - left;Decreased step length - right;Decreased stance time - right      General Gait Details: Assist to stabilize pt throughout distance. Pt tends to take slow, small steps. Some assist given to maneuver RW.    Stairs Stairs: Yes Stairs assistance: Min assist;Mod assist Stair Management: Step to pattern;Forwards;With walker Number of Stairs: 1 General stair comments: Assist to manage RW and stabilize pt. Multimodal cueing for safety, technique, sequence. More assist needed for step down due to pt's difficulty using R UE.    Wheelchair Mobility    Modified Rankin (Stroke Patients Only)       Balance Overall balance assessment: Needs assistance         Standing balance support: Bilateral upper extremity supported Standing balance-Leahy Scale: Poor                              Cognition Arousal/Alertness: Awake/alert Behavior During Therapy: WFL for tasks assessed/performed Overall Cognitive Status: Within Functional Limits for tasks assessed                                 General Comments: daughter was not present during session. Pt was able to understand enough to participate and complete tasks      Exercises      General Comments        Pertinent Vitals/Pain Pain Assessment: Faces Faces Pain Scale: Hurts little more Pain Location: R hip with activity Pain Descriptors / Indicators: Aching;Sore;Operative site guarding Pain Intervention(s): Limited activity within patient's tolerance;Repositioned;Ice applied    Home Living  Prior Function            PT Goals (current goals can now be found in the care plan section) Acute Rehab PT Goals Patient Stated Goal: Regain IND Progress towards PT goals: Progressing toward goals    Frequency    7X/week      PT Plan Current plan remains appropriate    Co-evaluation   Reason for Co-Treatment: For patient/therapist safety PT goals addressed during session: Mobility/safety with mobility OT goals addressed during session:  ADL's and self-care      AM-PAC PT "6 Clicks" Mobility   Outcome Measure  Help needed turning from your back to your side while in a flat bed without using bedrails?: A Lot Help needed moving from lying on your back to sitting on the side of a flat bed without using bedrails?: A Lot Help needed moving to and from a bed to a chair (including a wheelchair)?: A Lot Help needed standing up from a chair using your arms (e.g., wheelchair or bedside chair)?: A Lot Help needed to walk in hospital room?: A Little Help needed climbing 3-5 steps with a railing? : A Lot 6 Click Score: 13    End of Session Equipment Utilized During Treatment: Gait belt Activity Tolerance: Patient tolerated treatment well Patient left: in bed;with call bell/phone within reach;with family/visitor present   PT Visit Diagnosis: Difficulty in walking, not elsewhere classified (R26.2)     Time: 3754-3606 PT Time Calculation (min) (ACUTE ONLY): 18 min  Charges:  $Gait Training: 8-22 mins                        Weston Anna, PT Acute Rehabilitation Services Pager: [phone] Office: [phone]

## 2018-06-02 NOTE — Progress Notes (Signed)
PA on call paged to have prescriptions changed as medication is not available at pharmacy. PA called back and family information was given as to coordinate pain medication for tonight.

## 2018-06-02 NOTE — Plan of Care (Signed)

## 2018-06-03 LAB — TYPE AND SCREEN
ABO/RH(D): A POS
Antibody Screen: NEGATIVE
Unit division: 0
Unit division: 0

## 2018-06-03 LAB — BPAM RBC
Blood Product Expiration Date: 202002042359
Blood Product Expiration Date: 202002042359
ISSUE DATE / TIME: 202001101953
ISSUE DATE / TIME: 202001102349
Unit Type and Rh: 6200
Unit Type and Rh: 6200

## 2018-06-05 ENCOUNTER — Encounter: Payer: Self-pay | Admitting: Adult Health

## 2018-06-05 ENCOUNTER — Ambulatory Visit (INDEPENDENT_AMBULATORY_CARE_PROVIDER_SITE_OTHER): Payer: Medicare Other | Admitting: Adult Health

## 2018-06-05 VITALS — BP 112/68 | Temp 98.2°F

## 2018-06-05 DIAGNOSIS — I952 Hypotension due to drugs: Secondary | ICD-10-CM | POA: Diagnosis not present

## 2018-06-05 NOTE — Progress Notes (Signed)
Subjective:    Patient ID: Rachel Austin, female    DOB: Oct 14, 1955, 63 y.o.   MRN: [id number]  HPI  63 year old female who  has a past medical history of Acute blood loss anemia (06/01/2018), Anemia, Anemia, iron deficiency (06/01/2018), Cataract (2018), Depression, Hyperlipidemia, Hypertension, and Rheumatoid arthritis ([place]).   She presents with her daughter for concern for hypotension. The patient was discharged three days ago s/p right hip replacement. When she got home she felt lightheaded and dizzy while sitting in a chair. Her daughter took her blood pressure at it was 91/60. She did not give her her blood pressure medication and has not since the incident. Her daughter has been monitoring her blood pressure at home and reports blood pressure readings consistently in the 110-120's/ 70's. The patient has not felt lightheaded or dizzy since.   She is not taking prescribed oxycodone as the pharmacy would not fill it? She has been managing pain with OTC motrin and tylenol and reports good pain control   BP Readings from Last 3 Encounters:  06/05/18 112/68  06/02/18 106/66  05/24/18 112/68    Review of Systems See HPI   Past Medical History:  Diagnosis Date  . Acute blood loss anemia 06/01/2018  . Anemia   . Anemia, iron deficiency 06/01/2018  . Cataract 2018   small, bilateral  . Depression   . Hyperlipidemia   . Hypertension   . Rheumatoid arthritis ([place])    on meds    Social History   Socioeconomic History  . Marital status: Single    Spouse name: Not on file  . Number of children: Not on file  . Years of education: Not on file  . Highest education level: Not on file  Occupational History  . Not on file  Social Needs  . Financial resource strain: Not on file  . Food insecurity:    Worry: Not on file    Inability: Not on file  . Transportation needs:    Medical: Not on file    Non-medical: Not on file  Tobacco Use  . Smoking status: Never Smoker  . Smokeless  tobacco: Never Used  Substance and Sexual Activity  . Alcohol use: No    Alcohol/week: 0.0 standard drinks  . Drug use: No  . Sexual activity: Never  Lifestyle  . Physical activity:    Days per week: Not on file    Minutes per session: Not on file  . Stress: Not on file  Relationships  . Social connections:    Talks on phone: Not on file    Gets together: Not on file    Attends religious service: Not on file    Active member of club or organization: Not on file    Attends meetings of clubs or organizations: Not on file    Relationship status: Not on file  . Intimate partner violence:    Fear of current or ex partner: Not on file    Emotionally abused: Not on file    Physically abused: Not on file    Forced sexual activity: Not on file  Other Topics Concern  . Not on file  Social History Narrative   Moved from [place] to [facility] in December    Past Surgical History:  Procedure Laterality Date  . FINGER SURGERY  01/29/2017   last 2 fingers left hand  . FOOT SURGERY  2016   Bil/ foot reconstruction due to RA  . KNEE SURGERY Bilateral  Right knee-2010, Left knee -2006  . VARICOSE VEIN SURGERY  2015   right     Family History  Problem Relation Age of Onset  . Heart disease Mother        pacemaker, hypertensin    No Known Allergies  Current Outpatient Medications on File Prior to Visit  Medication Sig Dispense Refill  . acetaminophen (TYLENOL) 325 MG tablet Take 2 tablets (650 mg total) by mouth every 4 (four) hours as needed. 100 tablet 2  . aspirin EC 81 MG tablet Take 1 tablet (81 mg total) by mouth 2 (two) times daily. To prevent blood clots 60 tablet 0  . bisoprolol-hydrochlorothiazide (ZIAC) 5-6.25 MG tablet Take 1 tablet by mouth daily. 90 tablet 3  . CALCIUM PO Take 1 tablet by mouth daily. Calcium with Vit d 3 - 500 mg/2000-Take one tablet bid    . citalopram (CELEXA) 20 MG tablet TAKE ONE TABLET BY MOUTH ONCE DAILY 90 tablet 1  . docusate sodium (COLACE) 100 MG  capsule Take 1 capsule (100 mg total) by mouth daily as needed. 30 capsule 2  . Ferrous Sulfate Dried (SLOW RELEASE IRON) 45 MG TBCR Take 1 tablet by mouth 2 (two) times daily. 60 tablet 0  . Multiple Vitamins-Minerals (CENTRUM SILVER ULTRA WOMENS PO) Take 1 tablet by mouth daily.     Marland Kitchen oxyCODONE (ROXICODONE) 5 MG immediate release tablet Take 1 tablet (5 mg total) by mouth every 4 (four) hours as needed for up to 7 days for breakthrough pain. 40 tablet 0  . polyethylene glycol (MIRALAX / GLYCOLAX) packet 17grams in 6 oz of something to drink twice a day until bowel movement.  LAXITIVE.  Restart if two days since last bowel movement 14 each 0   No current facility-administered medications on file prior to visit.     BP 112/68   Temp 98.2 F (36.8 C)       Objective:   Physical Exam Vitals signs reviewed.  Constitutional:      Appearance: Normal appearance.  Cardiovascular:     Rate and Rhythm: Normal rate and regular rhythm.     Pulses: Normal pulses.     Heart sounds: Normal heart sounds.  Pulmonary:     Effort: Pulmonary effort is normal.     Breath sounds: Normal breath sounds.  Skin:    General: Skin is warm and dry.  Neurological:     General: No focal deficit present.     Mental Status: She is alert and oriented to person, place, and time.       Assessment & Plan:  1. Hypotension due to drugs - Possibly orthostatic hypotension or hypotension due to drug  - Will continue to have her stop Ziac. Parameters given to daughter on when to restart; if needed.    * She appears to be doing well s/p right hip replacement. She is walking with slow steady gait using a walker.   Dorothyann Peng, NP

## 2018-06-18 NOTE — Discharge Summary (Signed)
PATIENT ID: Rachel Austin        MRN:  [id number]          DOB/AGE: [birth year] / 63 y.o.    DISCHARGE SUMMARY  ADMISSION DATE:    05/31/2018 DISCHARGE DATE:   06/02/2018  ADMISSION DIAGNOSIS: OA RIGHT HIP    DISCHARGE DIAGNOSIS:  OA RIGHT HIP    ADDITIONAL DIAGNOSIS: Active Problems:   Rheumatoid arthritis ([place])   Hyperlipidemia   Essential hypertension   Depression   Degenerative joint disease of right hip   Acute blood loss anemia   Anemia, iron deficiency  Past Medical History:  Diagnosis Date  . Acute blood loss anemia 06/01/2018  . Anemia   . Anemia, iron deficiency 06/01/2018  . Cataract 2018   small, bilateral  . Depression   . Hyperlipidemia   . Hypertension   . Rheumatoid arthritis ([place])    on meds    PROCEDURE: Procedure(s): TOTAL HIP ARTHROPLASTY Right on 05/31/2018  CONSULTS: PT/OT    HISTORY:  See H&P in chart  HOSPITAL COURSE:  Rachel Austin is a 63 y.o. admitted on 05/31/2018 and found to have a diagnosis of OA RIGHT HIP.  After appropriate laboratory studies were obtained  they were taken to the operating room on 05/31/2018 and underwent  Procedure(s): TOTAL HIP ARTHROPLASTY  Right.   They were given perioperative antibiotics:  Anti-infectives (From admission, onward)   Start     Dose/Rate Route Frequency Ordered Stop   05/31/18 1430  ceFAZolin (ANCEF) IVPB 1 g/50 mL premix     1 g 100 mL/hr over 30 Minutes Intravenous Every 6 hours 05/31/18 1146 06/01/18 0229   05/31/18 0615  ceFAZolin (ANCEF) IVPB 2g/100 mL premix     2 g 200 mL/hr over 30 Minutes Intravenous On call to O.R. 05/31/18 0600 05/31/18 0813    .  Tolerated the procedure well.  Placed with a foley intraoperatively.     POD #1, allowed out of bed to a chair.  PT for ambulation and exercise program.  Foley D/C'd in morning.  IV saline locked.  O2 discontionued.  Hgb low transfused 2 units PRBC.  POD #2, continued PT and ambulation. Improved Hgb.  The remainder of the hospital  course was dedicated to ambulation and strengthening.   The patient was discharged on 2 days post op in  Stable condition.  Blood products given:2 units CC PRBC  DIAGNOSTIC STUDIES: Recent vital signs: No data found.     Recent laboratory studies: No results for input(s): WBC, HGB, HCT, PLT in the last 168 hours. No results for input(s): NA, K, CL, CO2, BUN, CREATININE, GLUCOSE, CALCIUM in the last 168 hours. Lab Results  Component Value Date   INR 1.18 05/31/2018   INR 1.00 05/24/2018   INR 1.1 (H) 05/01/2018     Recent Radiographic Studies :  Dg Pelvis Portable  Result Date: 05/31/2018 CLINICAL DATA:  Status post RIGHT total hip arthroplasty EXAM: PORTABLE PELVIS 1-2 VIEWS COMPARISON:  None. FINDINGS: RIGHT total hip arthroplasty changes identified. Slight accentuated AP inclination of the acetabular cup noted. Soft tissue postoperative changes noted.  No dislocation. IMPRESSION: RIGHT total hip arthroplasty with slight accentuated AP inclination of the acetabular cup. Electronically Signed   By: Margarette Canada M.D.   On: 05/31/2018 11:31   Dg Hip Port Unilat With Pelvis 1v Right  Result Date: 05/31/2018 CLINICAL DATA:  S/p RIGHT total hip arthroplasty EXAM: DG HIP (WITH OR WITHOUT PELVIS) 1V PORT RIGHT COMPARISON:  None. FINDINGS: RIGHT total hip arthroplasty changes identified. Accentuated AP inclination of the acetabular cup is noted. No acute fracture or dislocation identified. IMPRESSION: RIGHT total hip arthroplasty changes with accentuated AP inclination of the acetabular cup. Electronically Signed   By: Margarette Canada M.D.   On: 05/31/2018 11:30    DISCHARGE INSTRUCTIONS: Discharge Instructions    Discharge patient   Complete by:  As directed    Discharge disposition:  01-Home or Self Care   Discharge patient date:  06/02/2018      DISCHARGE MEDICATIONS:   Allergies as of 06/02/2018   No Known Allergies     Medication List    STOP taking these medications   Adalimumab 40  MG/0.8ML Pskt   methotrexate 2.5 MG tablet Commonly known as:  RHEUMATREX   naproxen 500 MG tablet Commonly known as:  NAPROSYN     TAKE these medications   acetaminophen 325 MG tablet Commonly known as:  TYLENOL Take 2 tablets (650 mg total) by mouth every 4 (four) hours as needed.   aspirin EC 81 MG tablet Take 1 tablet (81 mg total) by mouth 2 (two) times daily. To prevent blood clots   bisoprolol-hydrochlorothiazide 5-6.25 MG tablet Commonly known as:  ZIAC Take 1 tablet by mouth daily. Notes to patient:  FOLLOW UP With Doctor Monday to see if he wants to give you parameters.   CALCIUM PO Take 1 tablet by mouth daily. Calcium with Vit d 3 - 500 mg/2000-Take one tablet bid   CENTRUM SILVER ULTRA WOMENS PO Take 1 tablet by mouth daily.   citalopram 20 MG tablet Commonly known as:  CELEXA TAKE ONE TABLET BY MOUTH ONCE DAILY What changed:  Another medication with the same name was removed. Continue taking this medication, and follow the directions you see here.   docusate sodium 100 MG capsule Commonly known as:  COLACE Take 1 capsule (100 mg total) by mouth daily as needed. Notes to patient:  Stool [place]   Ferrous Sulfate Dried 45 MG Tbcr Commonly known as:  SLOW RELEASE IRON Take 1 tablet by mouth 2 (two) times daily. What changed:  when to take this   polyethylene glycol packet Commonly known as:  MIRALAX / GLYCOLAX 17grams in 6 oz of something to drink twice a day until bowel movement.  LAXITIVE.  Restart if two days since last bowel movement Notes to patient:  Mild Laxative       FOLLOW UP VISIT:   Follow-up Information    [name], MD. Schedule an appointment as soon as possible for a visit in 2 weeks.   Specialty:  Orthopedic Surgery Contact information: [address] [place] [address] [phone]        [facility] Follow up.   Specialty:  [place] Why:  [place] will  call to arrange initial visit Contact information: [address] [place] [address] [phone]           DISPOSITION:   Home  CONDITION:  Stable   Chriss Czar, PA-C  06/18/2018 4:44 PM

## 2018-07-03 ENCOUNTER — Other Ambulatory Visit: Payer: Self-pay | Admitting: Adult Health

## 2018-07-03 DIAGNOSIS — F32A Depression, unspecified: Secondary | ICD-10-CM

## 2018-07-03 DIAGNOSIS — F329 Major depressive disorder, single episode, unspecified: Secondary | ICD-10-CM

## 2018-07-04 NOTE — Telephone Encounter (Signed)
Rachel Austin, last cpx note says pt is taking medication.  This hasn't been filled since 2018.  Pt taking PRN?

## 2018-10-22 ENCOUNTER — Other Ambulatory Visit: Payer: Self-pay

## 2018-10-22 ENCOUNTER — Encounter: Payer: Self-pay | Admitting: Adult Health

## 2018-10-22 ENCOUNTER — Ambulatory Visit (INDEPENDENT_AMBULATORY_CARE_PROVIDER_SITE_OTHER): Payer: Medicare Other | Admitting: Adult Health

## 2018-10-22 ENCOUNTER — Ambulatory Visit: Payer: Medicare Other | Admitting: Adult Health

## 2018-10-22 DIAGNOSIS — M25562 Pain in left knee: Secondary | ICD-10-CM

## 2018-10-22 DIAGNOSIS — G8929 Other chronic pain: Secondary | ICD-10-CM | POA: Diagnosis not present

## 2018-10-22 NOTE — Progress Notes (Signed)
Virtual Visit via Video Note  I connected with Marcelino Scot on 10/22/18 at  2:30 PM EDT by a video enabled telemedicine application and verified that I am speaking with the correct person using two identifiers.  Location patient: home Location provider:work or home office Persons participating in the virtual visit: patient, provider, daughter  I discussed the limitations of evaluation and management by telemedicine and the availability of in person appointments. The patient expressed understanding and agreed to proceed.   HPI:  63 year old female who is being evaluated today for the complaint of left knee pain.  Pain has been present since about mid January, started after her right total hip replacement.  The pain is worse when getting up from a sitting position after a long period of time.  She denies any pain with ambulation and does not experience any pain when waking up in the morning and getting out of bed.  Has been no noticeable swelling or redness.  Pain will improve after she takes a few steps.  Pain is described as "intense".  And she also reports a clicking noise in her left knee when doing range of motion.  Denies falls or trauma   H/o of left knee replacement in 2006   ROS: See pertinent positives and negatives per HPI.  Past Medical History:  Diagnosis Date  . Acute blood loss anemia 06/01/2018  . Anemia   . Anemia, iron deficiency 06/01/2018  . Cataract 2018   small, bilateral  . Depression   . Hyperlipidemia   . Hypertension   . Rheumatoid arthritis ([place])    on meds    Past Surgical History:  Procedure Laterality Date  . FINGER SURGERY  01/29/2017   last 2 fingers left hand  . FOOT SURGERY  2016   Bil/ foot reconstruction due to RA  . KNEE SURGERY Bilateral    Right knee-2010, Left knee -2006  . TOTAL HIP ARTHROPLASTY Right 05/31/2018   Procedure: TOTAL HIP ARTHROPLASTY;  Surgeon: [name], MD;  Location: [facility];  Service: Orthopedics;  Laterality:  Right;  Marland Kitchen VARICOSE VEIN SURGERY  2015   right     Family History  Problem Relation Age of Onset  . Heart disease Mother        pacemaker, hypertensin     Current Outpatient Medications:  .  acetaminophen (TYLENOL) 325 MG tablet, Take 2 tablets (650 mg total) by mouth every 4 (four) hours as needed., Disp: 100 tablet, Rfl: 2 .  bisoprolol-hydrochlorothiazide (ZIAC) 5-6.25 MG tablet, Take 1 tablet by mouth daily., Disp: 90 tablet, Rfl: 3 .  CALCIUM PO, Take 1 tablet by mouth daily. Calcium with Vit d 3 - 500 mg/2000-Take one tablet bid, Disp: , Rfl:  .  ciprofloxacin (CIPRO) 500 MG tablet, TAKE 1 TABLET BY MOUTH EVERY 12 HOURS FOR 7 DAYS, Disp: , Rfl:  .  citalopram (CELEXA) 20 MG tablet, TAKE 1 TABLET BY MOUTH ONCE DAILY, Disp: 90 tablet, Rfl: 1 .  docusate sodium (COLACE) 100 MG capsule, Take 1 capsule (100 mg total) by mouth daily as needed., Disp: 30 capsule, Rfl: 2 .  Ferrous Sulfate Dried (SLOW RELEASE IRON) 45 MG TBCR, Take 1 tablet by mouth 2 (two) times daily., Disp: 60 tablet, Rfl: 0 .  Multiple Vitamins-Minerals (CENTRUM SILVER ULTRA WOMENS PO), Take 1 tablet by mouth daily. , Disp: , Rfl:  .  polyethylene glycol (MIRALAX / GLYCOLAX) packet, 17grams in 6 oz of something to drink twice a day until bowel movement.  LAXITIVE.  Restart if two days since last bowel movement, Disp: 14 each, Rfl: 0  EXAM:  VITALS per patient if applicable:  GENERAL: alert, oriented, appears well and in no acute distress  HEENT: atraumatic, conjunttiva clear, no obvious abnormalities on inspection of external nose and ears  NECK: normal movements of the head and neck  LUNGS: on inspection no signs of respiratory distress, breathing rate appears normal, no obvious gross SOB, gasping or wheezing  CV: no obvious cyanosis  MS: moves all visible extremities without noticeable abnormality  PSYCH/NEURO: pleasant and cooperative, no obvious depression or anxiety, speech and thought processing grossly  intact  ASSESSMENT AND PLAN:  1. Chronic pain of left knee -We will check x-ray for loose hardware.  Consider referral to physical therapy and/or orthopedic surgery. - DG Knee Complete 4 Views Left; Future   Discussed the following assessment and plan:    I discussed the assessment and treatment plan with the patient. The patient was provided an opportunity to ask questions and all were answered. The patient agreed with the plan and demonstrated an understanding of the instructions.   The patient was advised to call back or seek an in-person evaluation if the symptoms worsen or if the condition fails to improve as anticipated.   Dorothyann Peng, NP

## 2018-11-11 ENCOUNTER — Ambulatory Visit (INDEPENDENT_AMBULATORY_CARE_PROVIDER_SITE_OTHER): Payer: Medicare Other

## 2018-11-11 DIAGNOSIS — G8929 Other chronic pain: Secondary | ICD-10-CM

## 2018-11-11 DIAGNOSIS — M25562 Pain in left knee: Secondary | ICD-10-CM

## 2018-11-14 ENCOUNTER — Other Ambulatory Visit: Payer: Self-pay | Admitting: Family Medicine

## 2018-11-14 DIAGNOSIS — M25562 Pain in left knee: Secondary | ICD-10-CM

## 2018-11-14 DIAGNOSIS — I952 Hypotension due to drugs: Secondary | ICD-10-CM

## 2018-11-14 DIAGNOSIS — G8929 Other chronic pain: Secondary | ICD-10-CM

## 2018-11-20 ENCOUNTER — Ambulatory Visit: Payer: Medicare Other | Admitting: Orthopaedic Surgery

## 2018-11-27 ENCOUNTER — Ambulatory Visit: Payer: Medicare Other | Admitting: Orthopaedic Surgery

## 2018-11-28 ENCOUNTER — Other Ambulatory Visit: Payer: Self-pay

## 2018-11-28 ENCOUNTER — Encounter: Payer: Self-pay | Admitting: Orthopaedic Surgery

## 2018-11-28 ENCOUNTER — Ambulatory Visit (INDEPENDENT_AMBULATORY_CARE_PROVIDER_SITE_OTHER): Payer: Medicare Other | Admitting: Orthopaedic Surgery

## 2018-11-28 DIAGNOSIS — M25562 Pain in left knee: Secondary | ICD-10-CM | POA: Diagnosis not present

## 2018-11-28 NOTE — Progress Notes (Signed)
Office Visit Note   Patient: Rachel Austin           Date of Birth: Aug 21, 1955           MRN: [id number] Visit Date: 11/28/2018              Requested by: Dorothyann Peng, NP [address],  [place] [address] PCP: Dorothyann Peng, NP   Assessment & Plan: Visit Diagnoses:  1. Left knee pain, unspecified chronicity     Plan: Impression is left knee pain and stiffness from overall deconditioning following the right total hip replacement.  I believe the patient needs to work on left quadricep strengthening and we will send her to physical therapy for this.  A prescription was provided to the patient.  She will follow-up with [place] in 8 weeks time for recheck.  Call with concerns or questions in the meantime.  The entire encounter was discussed with her daughter who was present to act as the [place] interpreter.  Follow-Up Instructions: Return in about 8 weeks (around 01/23/2019).   Orders:  Orders Placed This Encounter  Procedures  . Ambulatory referral to Physical Therapy   No orders of the defined types were placed in this encounter.     Procedures: No procedures performed   Clinical Data: No additional findings.   Subjective: Chief Complaint  Patient presents with  . Left Knee - Pain    HPI patient is a pleasant 63 year old female who presents our clinic today with left knee pain.  She is status post left total knee replacement 2006 by a surgeon in [place].  She is also status post right total hip replacement January 2020 by Dr. French Ana.  She was doing well in regards to her left knee until the postoperative period from her right hip replacement.  She started favoring the leg a little more and thinks this is the reason why her pain began.  She complains more of a pain/stiffness to the patella which really only occurs when she goes from a seated to standing position.  She has recently felt as though the patella gets stuck when she is trying to stand up.  She  denies any pain to the groin or anterior thigh or even to the posterior leg.  She is tried Advil with good relief of symptoms.  She denies any numbness, tingling or burning.   Review of Systems as detailed in HPI.  All others reviewed and are negative.   Objective: Vital Signs: There were no vitals taken for this visit.  Physical Exam well-developed and well-nourished female in no acute distress.  Alert and oriented x3.  Ortho Exam examination of the left lower extremity reveals range of motion from 0 to 125 degrees.  She is stable valgus varus stress.  There is no patella maltracking.  No tenderness throughout the entire knee.  4 out of 5 strength with resisted straight leg raise.  Negative logroll and negative straight leg raise.  Specialty Comments:  No specialty comments available.  Imaging: No new imaging   PMFS History: Patient Active Problem List   Diagnosis Date Noted  . Acute blood loss anemia 06/01/2018  . Anemia, iron deficiency 06/01/2018  . Degenerative joint disease of right hip 05/31/2018  . Hyperlipidemia 03/01/2015  . Essential hypertension 03/01/2015  . Depression 03/01/2015  . Rheumatoid arthritis ([place]) 07/16/2014   Past Medical History:  Diagnosis Date  . Acute blood loss anemia 06/01/2018  . Anemia   . Anemia, iron  deficiency 06/01/2018  . Cataract 2018   small, bilateral  . Depression   . Hyperlipidemia   . Hypertension   . Rheumatoid arthritis ([place])    on meds    Family History  Problem Relation Age of Onset  . Heart disease Mother        pacemaker, hypertensin    Past Surgical History:  Procedure Laterality Date  . FINGER SURGERY  01/29/2017   last 2 fingers left hand  . FOOT SURGERY  2016   Bil/ foot reconstruction due to RA  . KNEE SURGERY Bilateral    Right knee-2010, Left knee -2006  . TOTAL HIP ARTHROPLASTY Right 05/31/2018   Procedure: TOTAL HIP ARTHROPLASTY;  Surgeon: [name], MD;  Location: [facility];  Service: Orthopedics;   Laterality: Right;  Marland Kitchen VARICOSE VEIN SURGERY  2015   right    Social History   Occupational History  . Not on file  Tobacco Use  . Smoking status: Never Smoker  . Smokeless tobacco: Never Used  Substance and Sexual Activity  . Alcohol use: No    Alcohol/week: 0.0 standard drinks  . Drug use: No  . Sexual activity: Never

## 2019-01-22 ENCOUNTER — Ambulatory Visit: Payer: Medicare Other | Admitting: Orthopaedic Surgery

## 2019-01-23 ENCOUNTER — Ambulatory Visit: Payer: Medicare Other | Admitting: Orthopaedic Surgery

## 2019-04-05 ENCOUNTER — Other Ambulatory Visit: Payer: Self-pay | Admitting: Adult Health

## 2019-04-05 DIAGNOSIS — F32A Depression, unspecified: Secondary | ICD-10-CM

## 2019-04-05 DIAGNOSIS — F329 Major depressive disorder, single episode, unspecified: Secondary | ICD-10-CM

## 2019-04-09 ENCOUNTER — Encounter: Payer: Self-pay | Admitting: Family Medicine

## 2019-04-09 NOTE — Telephone Encounter (Signed)
#  30 sent to the pharmacy.  Message sent to pharmacy and letter released to [place].

## 2019-05-09 ENCOUNTER — Other Ambulatory Visit: Payer: Self-pay | Admitting: Adult Health

## 2019-05-09 DIAGNOSIS — I10 Essential (primary) hypertension: Secondary | ICD-10-CM

## 2019-05-13 NOTE — Telephone Encounter (Signed)
30 day supply sent to the pharmacy.  Pt is due for cpx and fasting lab work.

## 2019-06-01 ENCOUNTER — Other Ambulatory Visit: Payer: Self-pay | Admitting: Adult Health

## 2019-06-01 DIAGNOSIS — F32A Depression, unspecified: Secondary | ICD-10-CM

## 2019-06-01 DIAGNOSIS — F329 Major depressive disorder, single episode, unspecified: Secondary | ICD-10-CM

## 2019-06-06 ENCOUNTER — Encounter: Payer: Self-pay | Admitting: Adult Health

## 2019-06-06 ENCOUNTER — Other Ambulatory Visit: Payer: Self-pay

## 2019-06-06 ENCOUNTER — Ambulatory Visit (INDEPENDENT_AMBULATORY_CARE_PROVIDER_SITE_OTHER): Payer: Medicare Other | Admitting: Adult Health

## 2019-06-06 VITALS — BP 120/80 | HR 25 | Temp 98.1°F | Ht 61.0 in | Wt 140.0 lb

## 2019-06-06 DIAGNOSIS — Z0000 Encounter for general adult medical examination without abnormal findings: Secondary | ICD-10-CM | POA: Diagnosis not present

## 2019-06-06 DIAGNOSIS — E782 Mixed hyperlipidemia: Secondary | ICD-10-CM | POA: Diagnosis not present

## 2019-06-06 DIAGNOSIS — D508 Other iron deficiency anemias: Secondary | ICD-10-CM

## 2019-06-06 DIAGNOSIS — I10 Essential (primary) hypertension: Secondary | ICD-10-CM

## 2019-06-06 DIAGNOSIS — Z23 Encounter for immunization: Secondary | ICD-10-CM | POA: Diagnosis not present

## 2019-06-06 DIAGNOSIS — M05721 Rheumatoid arthritis with rheumatoid factor of right elbow without organ or systems involvement: Secondary | ICD-10-CM

## 2019-06-06 DIAGNOSIS — F329 Major depressive disorder, single episode, unspecified: Secondary | ICD-10-CM | POA: Diagnosis not present

## 2019-06-06 MED ORDER — BISOPROLOL-HYDROCHLOROTHIAZIDE 5-6.25 MG PO TABS: 1.0000 | ORAL_TABLET | Freq: Every day | ORAL | 3 refills | Status: DC

## 2019-06-06 MED ORDER — CITALOPRAM HYDROBROMIDE 20 MG PO TABS: 20.0000 mg | ORAL_TABLET | Freq: Every day | ORAL | 1 refills | Status: DC

## 2019-06-06 NOTE — Addendum Note (Signed)
Addended by: Marya Amsler R on: 06/06/2019 10:30 AM   Modules accepted: Orders

## 2019-06-06 NOTE — Patient Instructions (Signed)
Health Maintenance Due  Topic Date Due  . HIV Screening  09/13/1971  . PAP SMEAR-Modifier  09/12/1977  . MAMMOGRAM  04/02/2016  . INFLUENZA VACCINE  12/21/2018    Depression screen [facility] 2/9 11/08/2016 12/30/2014 07/16/2014  Decreased Interest 0 2 3  Down, Depressed, Hopeless 0 2 3  PHQ - 2 Score 0 4 6  Altered sleeping - 2 0  Tired, decreased energy - 3 3  Change in appetite - 1 0  Feeling bad or failure about yourself  - 0 2  Trouble concentrating - 0 0  Moving slowly or fidgety/restless - 0 2  Suicidal thoughts - 0 0  PHQ-9 Score - 10 13

## 2019-06-06 NOTE — Addendum Note (Signed)
Addended by: Suzette Battiest on: 06/06/2019 11:06 AM   Modules accepted: Orders

## 2019-06-06 NOTE — Progress Notes (Signed)
Subjective:    Patient ID: Rachel Austin, female    DOB: 1955-06-12, 64 y.o.   MRN: [id number]  HPI Patient presents for yearly preventative medicine examination. She is a pleasant 64 year old who  has a past medical history of Acute blood loss anemia (06/01/2018), Anemia, Anemia, iron deficiency (06/01/2018), Cataract (2018), Depression, Hyperlipidemia, Hypertension, and Rheumatoid arthritis ([place]).  Essential Hypertension -takes Ziac 5-6.25 mg.  She denies dizziness, lightheadedness, chest pain, or shortness of breath BP Readings from Last 3 Encounters:  06/05/18 112/68  06/02/18 106/66  05/24/18 112/68   Depression - Takes Celexa 20 mg - overall she feels well controlled but recently lost a close family member so she is going through the grieving process.   RA- is followed by Rheumatology, currently on Humira but is trying to get her approved for Enbrel. She continues to have joint pain.   Iron Deficiency Anemia - takes OTC iron supplement  Lab Results  Component Value Date   WBC 9.7 06/02/2018   HGB 9.0 (L) 06/02/2018   HCT 29.2 (L) 06/02/2018   MCV 89.6 06/02/2018   PLT 239 06/02/2018    H/o Hyperlipidemia - Not currently on medications. Diet controlled.  Lab Results  Component Value Date   CHOL 171 01/31/2018   HDL 50.40 01/31/2018   LDLCALC 99 01/31/2018   TRIG 111.0 01/31/2018   CHOLHDL 3 01/31/2018     All immunizations and health maintenance protocols were reviewed with the patient and needed orders were placed. She is up to date on vaccinations   Appropriate screening laboratory values were ordered for the patient including screening of hyperlipidemia, renal function and hepatic function.  Medication reconciliation,  past medical history, social history, problem list and allergies were reviewed in detail with the patient  Goals were established with regard to weight loss, exercise, and  diet in compliance with medications  Wt Readings from Last 3 Encounters:    06/06/19 140 lb (63.5 kg)  05/31/18 127 lb 4 oz (57.7 kg)  05/24/18 127 lb 4 oz (57.7 kg)    End of life planning was discussed.  She is due for her mammogram has not followed up with GYN but plans to call and schedule her appointment. Marland Kitchen  Up-to-date on routine screening colonoscopy and eye exam. She has an upcoming dental appointment.   She has no acute complaints.    Review of Systems  Constitutional: Negative.   HENT: Negative.   Eyes: Negative.   Respiratory: Negative.   Cardiovascular: Negative.   Gastrointestinal: Negative.   Endocrine: Negative.   Genitourinary: Negative.   Musculoskeletal: Positive for arthralgias.  Skin: Negative.   Allergic/Immunologic: Negative.   Neurological: Negative.   Hematological: Negative.   Psychiatric/Behavioral: Negative.    Past Medical History:  Diagnosis Date  . Acute blood loss anemia 06/01/2018  . Anemia   . Anemia, iron deficiency 06/01/2018  . Cataract 2018   small, bilateral  . Depression   . Hyperlipidemia   . Hypertension   . Rheumatoid arthritis ([place])    on meds    Social History   Socioeconomic History  . Marital status: Single    Spouse name: Not on file  . Number of children: Not on file  . Years of education: Not on file  . Highest education level: Not on file  Occupational History  . Not on file  Tobacco Use  . Smoking status: Never Smoker  . Smokeless tobacco: Never Used  Substance and Sexual Activity  .  Alcohol use: No    Alcohol/week: 0.0 standard drinks  . Drug use: No  . Sexual activity: Never  Other Topics Concern  . Not on file  Social History Narrative   Moved from [place] to [place] in December   Social Determinants of Health   Financial Resource Strain:   . Difficulty of Paying Living Expenses: Not on file  Food Insecurity:   . Worried About Charity fundraiser in the Last Year: Not on file  . Ran Out of Food in the Last Year: Not on file  Transportation Needs:   . Lack of Transportation  (Medical): Not on file  . Lack of Transportation (Non-Medical): Not on file  Physical Activity:   . Days of Exercise per Week: Not on file  . Minutes of Exercise per Session: Not on file  Stress:   . Feeling of Stress : Not on file  Social Connections:   . Frequency of Communication with Friends and Family: Not on file  . Frequency of Social Gatherings with Friends and Family: Not on file  . Attends Religious Services: Not on file  . Active Member of Clubs or Organizations: Not on file  . Attends Archivist Meetings: Not on file  . Marital Status: Not on file  Intimate Partner Violence:   . Fear of Current or Ex-Partner: Not on file  . Emotionally Abused: Not on file  . Physically Abused: Not on file  . Sexually Abused: Not on file    Past Surgical History:  Procedure Laterality Date  . FINGER SURGERY  01/29/2017   last 2 fingers left hand  . FOOT SURGERY  2016   Bil/ foot reconstruction due to RA  . KNEE SURGERY Bilateral    Right knee-2010, Left knee -2006  . 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 Heart disease Mother        pacemaker, hypertensin    No Known Allergies  Current Outpatient Medications on File Prior to Visit  Medication Sig Dispense Refill  . bisoprolol-hydrochlorothiazide (ZIAC) 5-6.25 MG tablet Take 1 tablet by mouth daily. **DUE FOR YEARLY PHYSICAL** 30 tablet 0  . CALCIUM PO Take 1 tablet by mouth daily. Calcium with Vit d 3 - 500 mg/2000-Take one tablet bid    . ciprofloxacin (CIPRO) 500 MG tablet TAKE 1 TABLET BY MOUTH EVERY 12 HOURS FOR 7 DAYS    . citalopram (CELEXA) 20 MG tablet Take 1 tablet (20 mg total) by mouth daily. **DUE FOR YEARLY PHYSICAL** 30 tablet 0  . Ferrous Sulfate Dried (SLOW RELEASE IRON) 45 MG  TBCR Take 1 tablet by mouth 2 (two) times daily. 60 tablet 0  . Multiple Vitamins-Minerals (CENTRUM SILVER ULTRA WOMENS PO) Take 1 tablet by mouth daily.     . polyethylene glycol (MIRALAX / GLYCOLAX) packet 17grams in 6 oz of something to drink twice a day until bowel movement.  LAXITIVE.  Restart if two days since last bowel movement 14 each 0   No current facility-administered medications on file prior to visit.    There were no vitals taken for this visit.      Objective:   Physical Exam Vitals and nursing note reviewed.  Constitutional:      General: She  is not in acute distress.    Appearance: Normal appearance. She is well-developed.  HENT:     Head: Normocephalic and atraumatic.     Right Ear: Tympanic membrane, ear canal and external ear normal. There is no impacted cerumen.     Left Ear: Tympanic membrane, ear canal and external ear normal. There is no impacted cerumen.     Nose: Nose normal. No congestion or rhinorrhea.     Mouth/Throat:     Mouth: Mucous membranes are moist.     Pharynx: Oropharynx is clear. No oropharyngeal exudate.  Eyes:     General:        Right eye: No discharge.        Left eye: No discharge.     Conjunctiva/sclera: Conjunctivae normal.     Pupils: Pupils are equal, round, and reactive to light.  Neck:     Thyroid: No thyromegaly.     Trachea: No tracheal deviation.  Cardiovascular:     Rate and Rhythm: Normal rate and regular rhythm.     Pulses: Normal pulses.     Heart sounds: Normal heart sounds. No murmur. No friction rub. No gallop.   Pulmonary:     Effort: Pulmonary effort is normal. No respiratory distress.     Breath sounds: Normal breath sounds. No stridor. No wheezing, rhonchi or rales.  Chest:     Chest wall: No tenderness.  Abdominal:     General: Bowel sounds are normal. There is no distension.     Palpations: Abdomen is soft. There is no mass.     Tenderness: There is no abdominal tenderness. There is no right CVA  tenderness, left CVA tenderness, guarding or rebound.     Hernia: No hernia is present.  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 Normal range of motion.     Cervical back: Normal range of motion and neck supple.  Lymphadenopathy:     Cervical: No cervical adenopathy.  Skin:    General: Skin is warm and dry.     Capillary Refill: Capillary refill takes less than 2 seconds.     Coloration: Skin is not jaundiced or pale.     Findings: No bruising, erythema, lesion or rash.  Neurological:     General: No focal deficit present.     Mental Status: She is alert and oriented to person, place, and time.     Cranial Nerves: No cranial nerve deficit.     Sensory: No sensory deficit.     Motor: No weakness.     Coordination: Coordination normal.     Gait: Gait normal.     Deep Tendon Reflexes: Reflexes normal.  Psychiatric:        Mood and Affect: Mood normal.        Behavior: Behavior normal.        Thought Content: Thought content normal.        Judgment: Judgment normal.       Assessment & Plan:  1. Routine general medical examination at a health care facility - Looks well  - Follow up in one year or sooner if needed - CBC with Differential/Platelet - CMP - Lipid panel - TSH  2. Essential hypertension - well controlled.  - No change in medications  - CBC with Differential/Platelet - CMP - Lipid panel - TSH - bisoprolol-hydrochlorothiazide (ZIAC) 5-6.25 MG tablet; Take 1 tablet by mouth daily.  Dispense: 90 tablet; Refill: 3  3. Mixed hyperlipidemia - consider statin  - CBC with Differential/Platelet - CMP - Lipid panel - TSH  4. Reactive depression PHQ 9 = 3 - citalopram (CELEXA) 20 MG tablet; Take 1 tablet (20 mg total) by mouth daily.  Dispense: 90 tablet; Refill: 1  5. 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 Other iron  deficiency anemia  - CBC with Differential/Platelet - CMP - Lipid panel - TSH - IBC + Ferritin  Dorothyann Peng, NP

## 2019-08-25 DIAGNOSIS — H40013 Open angle with borderline findings, low risk, bilateral: Secondary | ICD-10-CM | POA: Diagnosis not present

## 2019-08-25 DIAGNOSIS — H0102A Squamous blepharitis right eye, upper and lower eyelids: Secondary | ICD-10-CM | POA: Diagnosis not present

## 2019-08-25 DIAGNOSIS — H17823 Peripheral opacity of cornea, bilateral: Secondary | ICD-10-CM | POA: Diagnosis not present

## 2019-08-25 DIAGNOSIS — H16223 Keratoconjunctivitis sicca, not specified as Sjogren's, bilateral: Secondary | ICD-10-CM | POA: Diagnosis not present

## 2019-08-25 DIAGNOSIS — H2513 Age-related nuclear cataract, bilateral: Secondary | ICD-10-CM | POA: Diagnosis not present

## 2019-11-10 ENCOUNTER — Emergency Department (HOSPITAL_COMMUNITY)
Admission: EM | Admit: 2019-11-10 | Discharge: 2019-11-11 | Disposition: A | Discharge status: Home / Self Care | Payer: Medicare Other | Attending: Emergency Medicine | Admitting: Emergency Medicine

## 2019-11-10 ENCOUNTER — Other Ambulatory Visit: Payer: Self-pay

## 2019-11-10 ENCOUNTER — Encounter (HOSPITAL_COMMUNITY): Payer: Self-pay

## 2019-11-10 DIAGNOSIS — R1084 Generalized abdominal pain: Secondary | ICD-10-CM | POA: Insufficient documentation

## 2019-11-10 DIAGNOSIS — Z79899 Other long term (current) drug therapy: Secondary | ICD-10-CM | POA: Insufficient documentation

## 2019-11-10 DIAGNOSIS — K625 Hemorrhage of anus and rectum: Secondary | ICD-10-CM | POA: Insufficient documentation

## 2019-11-10 DIAGNOSIS — K921 Melena: Secondary | ICD-10-CM | POA: Diagnosis not present

## 2019-11-10 DIAGNOSIS — Z86010 Personal history of colonic polyps: Secondary | ICD-10-CM | POA: Insufficient documentation

## 2019-11-10 DIAGNOSIS — M069 Rheumatoid arthritis, unspecified: Secondary | ICD-10-CM | POA: Diagnosis not present

## 2019-11-10 DIAGNOSIS — I10 Essential (primary) hypertension: Secondary | ICD-10-CM | POA: Diagnosis not present

## 2019-11-10 DIAGNOSIS — R140 Abdominal distension (gaseous): Secondary | ICD-10-CM | POA: Insufficient documentation

## 2019-11-10 LAB — COMPREHENSIVE METABOLIC PANEL
ALT: 15 U/L (ref 0–44)
AST: 21 U/L (ref 15–41)
Albumin: 3.9 g/dL (ref 3.5–5.0)
Alkaline Phosphatase: 92 U/L (ref 38–126)
Anion gap: 12 (ref 5–15)
BUN: 12 mg/dL (ref 8–23)
CO2: 23 mmol/L (ref 22–32)
Calcium: 10.4 mg/dL — ABNORMAL HIGH (ref 8.9–10.3)
Chloride: 102 mmol/L (ref 98–111)
Creatinine, Ser: 0.52 mg/dL (ref 0.44–1.00)
GFR calc Af Amer: >60 mL/min (ref >60)
GFR calc non Af Amer: >60 mL/min (ref >60)
Glucose, Bld: 91 mg/dL (ref 70–99)
Potassium: 3.8 mmol/L (ref 3.5–5.1)
Sodium: 137 mmol/L (ref 135–145)
Total Bilirubin: 0.8 mg/dL (ref 0.3–1.2)
Total Protein: 8 g/dL (ref 6.5–8.1)

## 2019-11-10 LAB — CBC
HCT: 40.8 % (ref 36.0–46.0)
Hemoglobin: 12.5 g/dL (ref 12.0–15.0)
MCH: 28.7 pg (ref 26.0–34.0)
MCHC: 30.6 g/dL (ref 30.0–36.0)
MCV: 93.6 fL (ref 80.0–100.0)
Platelets: 373 10*3/uL (ref 150–400)
RBC: 4.36 MIL/uL (ref 3.87–5.11)
RDW: 12.9 % (ref 11.5–15.5)
WBC: 7.7 10*3/uL (ref 4.0–10.5)
nRBC: 0 % (ref 0.0–0.2)

## 2019-11-10 LAB — TYPE AND SCREEN
ABO/RH(D): A POS
Antibody Screen: NEGATIVE

## 2019-11-10 NOTE — ED Triage Notes (Signed)
Pt arrives to ED w/ c/o blood in stool. Pt reports bright red bleeding that started on Friday.

## 2019-11-11 ENCOUNTER — Other Ambulatory Visit: Payer: Self-pay

## 2019-11-11 ENCOUNTER — Emergency Department (HOSPITAL_COMMUNITY): Payer: Medicare Other

## 2019-11-11 ENCOUNTER — Telehealth: Payer: Self-pay | Admitting: Gastroenterology

## 2019-11-11 DIAGNOSIS — K921 Melena: Secondary | ICD-10-CM | POA: Diagnosis not present

## 2019-11-11 LAB — ABO/RH: ABO/RH(D): A POS

## 2019-11-11 MED ORDER — IOHEXOL 300 MG/ML  SOLN
100.0000 mL | Freq: Once | INTRAMUSCULAR | Status: AC | PRN
  Administered 2019-11-11: 100 mL via INTRAVENOUS

## 2019-11-11 NOTE — ED Notes (Signed)
Pt transported to CT ?

## 2019-11-11 NOTE — ED Notes (Signed)
Patient returned from CT

## 2019-11-11 NOTE — Telephone Encounter (Signed)
Patient scheduled for 12/04/19. She will follow up with her PCP on 11/25/19.

## 2019-11-11 NOTE — ED Provider Notes (Signed)
[facility] EMERGENCY DEPARTMENT Provider Note   CSN: [id number] Arrival date & time: 11/10/19  1656     History Chief Complaint  Patient presents with  . GI Bleeding    Rachel Austin is a 64 y.o. female.  HPI    Patient presents with concern for abdominal distention and rectal bleeding. Initial history obtained with assistance of a translator via video conference. Patient notes history of depression, hypertension, has been taking her medication as directed. This episode of bleeding began 3 days ago.  Since onset she has had rectal bleeding, with associated abdominal distention.  No vomiting, no fever, no cough, no dyspnea. No clear alleviating or exacerbating factors. Patient does have a history of colonoscopy, possibly 3 years ago, though that time is unclear.  Reportedly the patient has history of polyps.  She does not offer history of malignancy.  With consideration of persistent bleeding she presents for evaluation. Past Medical History:  Diagnosis Date  . Acute blood loss anemia 06/01/2018  . Anemia   . Anemia, iron deficiency 06/01/2018  . Cataract 2018   small, bilateral  . Depression   . Hyperlipidemia   . Hypertension   . Rheumatoid arthritis ([place])    on meds    Patient Active Problem List   Diagnosis Date Noted  . Acute blood loss anemia 06/01/2018  . Anemia, iron deficiency 06/01/2018  . Degenerative joint disease of right hip 05/31/2018  . Hyperlipidemia 03/01/2015  . Essential hypertension 03/01/2015  . Depression 03/01/2015  . Rheumatoid arthritis ([place]) 07/16/2014    Past Surgical History:  Procedure Laterality Date  . FINGER SURGERY  01/29/2017   last 2 fingers left hand  . FOOT SURGERY  2016   Bil/ foot reconstruction due to RA  . KNEE SURGERY Bilateral    Right knee-2010, Left knee -2006  . TOTAL HIP ARTHROPLASTY Right 05/31/2018   Procedure: TOTAL HIP ARTHROPLASTY;  Surgeon: [name], MD;  Location: [facility] ORS;  Service:  Orthopedics;  Laterality: Right;  Marland Kitchen VARICOSE VEIN SURGERY  2015   right      OB History   No obstetric history on file.     Family History  Problem Relation Age of Onset  . Heart disease Mother        pacemaker, hypertensin    Social History   Tobacco Use  . Smoking status: Never Smoker  . Smokeless tobacco: Never Used  Substance Use Topics  . Alcohol use: No    Alcohol/week: 0.0 standard drinks  . Drug use: No    Home Medications Prior to Admission medications   Medication Sig Start Date End Date Taking? Authorizing Provider  bisoprolol-hydrochlorothiazide (ZIAC) 5-6.25 MG tablet Take 1 tablet by mouth daily. 06/06/19  Yes Nafziger, Tommi Rumps, NP  Calcium Carb-Cholecalciferol (CALCIUM + D3 PO) Take 1 tablet by mouth daily.   Yes [provider]  citalopram (CELEXA) 20 MG tablet Take 1 tablet (20 mg total) by mouth daily. 06/06/19  Yes Nafziger, Tommi Rumps, NP  Ferrous Sulfate Dried (SLOW RELEASE IRON) 45 MG TBCR Take 1 tablet by mouth 2 (two) times daily. 06/02/18  Yes Shepperson, Kirstin, PA-C  folic acid (FOLVITE) 1 MG tablet Take 1 mg by mouth daily. 08/25/19  Yes [provider]  methotrexate 2.5 MG tablet Take 25 mg by mouth once a week. Thursdays 10/03/19  Yes [provider]  Multiple Vitamins-Minerals (CENTRUM SILVER ULTRA WOMENS PO) Take 1 tablet by mouth daily.    Yes [provider]  polyethylene glycol (MIRALAX / GLYCOLAX) packet 17grams in 6 oz of something to drink twice a day until bowel movement.  LAXITIVE.  Restart if two days since last bowel movement 06/02/18  Yes Shepperson, Kirstin, PA-C    Allergies    Patient has no known allergies.  Review of Systems   Review of Systems  Constitutional:       Per HPI, otherwise negative  HENT:       Per HPI, otherwise negative  Respiratory:       Per HPI, otherwise negative  Cardiovascular:       Per HPI, otherwise negative  Gastrointestinal: Positive for abdominal distention and blood in  stool. Negative for vomiting.  Endocrine:       Negative aside from HPI  Genitourinary:       Neg aside from HPI   Musculoskeletal:       Per HPI, otherwise negative  Skin: Negative.   Neurological: Negative for syncope.    Physical Exam Updated Vital Signs BP 113/62   Pulse (!) 57   Temp 97.9 F (36.6 C) (Oral)   Resp 17   Ht 4\' 10"  (1.473 m)   Wt 59 kg   SpO2 100%   BMI 27.17 kg/m   Physical Exam Vitals and nursing note reviewed.  Constitutional:      General: She is not in acute distress.    Appearance: She is well-developed.  HENT:     Head: Normocephalic and atraumatic.  Eyes:     Conjunctiva/sclera: Conjunctivae normal.  Cardiovascular:     Rate and Rhythm: Normal rate and regular rhythm.  Pulmonary:     Effort: Pulmonary effort is normal. No respiratory distress.     Breath sounds: Normal breath sounds. No stridor.  Abdominal:     General: There is no distension.     Tenderness: There is no abdominal tenderness. There is no guarding.     Comments: Though the patient states that her abdomen is full, with gas-like sensation of distention, no gross distention, no peritoneal findings.  Skin:    General: Skin is warm and dry.  Neurological:     Mental Status: She is alert and oriented to person, place, and time.     Cranial Nerves: No cranial nerve deficit.     ED Results / Procedures / Treatments   Labs (all labs ordered are listed, but only abnormal results are displayed) Labs Reviewed  COMPREHENSIVE METABOLIC PANEL - Abnormal; Notable for the following components:      Result Value   Calcium 10.4 (*)    All other components within normal limits  CBC  TYPE AND SCREEN  ABO/RH    EKG None  Radiology CT Abdomen Pelvis W Contrast  Result Date: 11/11/2019 CLINICAL DATA:  64 year old female with history of abdominal distension. Blood in stool. EXAM: CT ABDOMEN AND PELVIS WITH CONTRAST TECHNIQUE: Multidetector CT imaging of the abdomen and pelvis was  performed using the standard protocol following bolus administration of intravenous contrast. CONTRAST:  144mL OMNIPAQUE IOHEXOL 300 MG/ML  SOLN COMPARISON:  No priors. FINDINGS: Lower chest: Unremarkable. Hepatobiliary: No suspicious cystic or solid hepatic lesions. No intra or extrahepatic biliary ductal dilatation. Gallbladder is normal in appearance. Pancreas: No pancreatic mass. No pancreatic ductal dilatation. No pancreatic or peripancreatic fluid collections or inflammatory changes. Spleen: Unremarkable. Adrenals/Urinary Tract: Bilateral kidneys and adrenal glands are normal in appearance. No hydroureteronephrosis. Urinary bladder is normal in appearance. Stomach/Bowel: Normal appearance of the stomach. No pathologic dilatation of  small bowel or colon. Normal appendix. Vascular/Lymphatic: Aortic atherosclerosis, without evidence of aneurysm or dissection in the abdominal or pelvic vasculature. Multiple prominent borderline enlarged and mildly enlarged left-sided pelvic lymph nodes, measuring up to 1.3 cm in short axis in the left obturator region (axial image 65 of series 3). No right-sided pelvic lymphadenopathy. No other abdominal lymphadenopathy. Reproductive: Uterus and ovaries are unremarkable in appearance. Other: No significant volume of ascites.  No pneumoperitoneum. Musculoskeletal: Status post right hip arthroplasty. Lucent lesion with narrow zone of transition in the left ilium adjacent to the superior aspect of the left acetabulum measuring 2.1 x 1.5 x 1.3 cm. Notably, there is joint space narrowing, subchondral sclerosis and osteophyte formation in the left hip joint, indicative of osteoarthritis. There are no aggressive appearing lytic or blastic lesions noted in the visualized portions of the skeleton. IMPRESSION: 1. No acute findings are noted in the abdomen or pelvis to account for the patient's symptoms. 2. However, there is lymphadenopathy in the left hemipelvis. This is of uncertain  etiology and significance. Although this may simply be reactive, the possibility of neoplasm is not excluded. Repeat CT the abdomen and pelvis with contrast is recommended in 3-6 months to ensure the stability or resolution of these findings. 3. Lucent lesion in the left ilium adjacent to the left acetabulum, favored to represent a benign lesion such as a geode. Attention at time of repeat CT examination is recommended to ensure continued stability. 4. Aortic atherosclerosis. Electronically Signed   By: Vinnie Langton M.D.   On: 11/11/2019 11:10    Procedures Procedures (including critical care time)  Medications Ordered in ED Medications  iohexol (OMNIPAQUE) 300 MG/ML solution 100 mL (100 mLs Intravenous Contrast Given 11/11/19 1034)    ED Course  I have reviewed the triage vital signs and the nursing notes.  Pertinent labs & imaging results that were available during my care of the patient were reviewed by me and considered in my medical decision making (see chart for details).  Given description of abdominal pain, in the context of ongoing bleeding, some consideration of infection versus diverticulosis prompts initial evaluation with CT scan. Initial labs resulted prior to my evaluation, notable for hemoglobin of 12.5. Patient placed on monitors, CT scan pending.   11:53 AM Patient in no distress.  Results discussed with interpreter, nurse present.  No evidence for substantial abnormalities on CT, some suspicion for diverticulosis versus bleeding from polyps. On reviewing the patient's chart, discussing her colonoscopy, the patient has a gastroenterologist with whom she can follow-up in the coming days. Patient amenable to discharge plan. Given the absence of hemodynamic instability, no hypotension, absence of substantial hemoglobin drop, no evidence for acute infection or other acute new pathology, the patient is appropriate for discharge with close outpatient follow-up.   Final  Clinical Impression(s) / ED Diagnoses Final diagnoses:  Rectal bleeding     Carmin Muskrat, MD 11/11/19 1155

## 2019-11-11 NOTE — ED Notes (Addendum)
Patient transported to CT 

## 2019-11-25 ENCOUNTER — Inpatient Hospital Stay: Payer: Medicare Other | Admitting: Adult Health

## 2019-11-25 NOTE — Progress Notes (Deleted)
   Subjective:    Patient ID: Rachel Austin, female    DOB: 1955/05/25, 64 y.o.   MRN: [id number]  HPI 64 year old female who  has a past medical history of Acute blood loss anemia (06/01/2018), Anemia, Anemia, iron deficiency (06/01/2018), Cataract (2018), Depression, Hyperlipidemia, Hypertension, and Rheumatoid arthritis ([place]).  She was seen in the ER on 11/10/2019 for the concern of abdominal distention and rectal bleeding.  She noted episode of rectal bleeding that began 3 days prior to being seen in the ER.  Since the onset she had no rectal bleeding with associated abdominal distention.  She denied vomiting, fever, cough, or dyspnea.  She does have a history of colon polyps.   In the ER she had a CT of abdomen and pelvis which did not show any abnormalities but some suspicion for diverticulitis versus bleeding from polyps..  She was advised to follow-up with her PCP and gastroenterologist.  She has an appointment with GI on 12/04/2019   Review of Systems     Objective:   Physical Exam        Assessment & Plan:

## 2019-12-04 ENCOUNTER — Ambulatory Visit: Payer: Medicare Other | Admitting: Nurse Practitioner

## 2020-01-19 ENCOUNTER — Other Ambulatory Visit: Payer: Self-pay | Admitting: Adult Health

## 2020-01-19 DIAGNOSIS — F329 Major depressive disorder, single episode, unspecified: Secondary | ICD-10-CM

## 2020-01-25 ENCOUNTER — Other Ambulatory Visit: Payer: Self-pay | Admitting: Adult Health

## 2020-01-25 DIAGNOSIS — F329 Major depressive disorder, single episode, unspecified: Secondary | ICD-10-CM

## 2020-02-24 DIAGNOSIS — H40013 Open angle with borderline findings, low risk, bilateral: Secondary | ICD-10-CM | POA: Diagnosis not present

## 2020-02-24 DIAGNOSIS — H0102A Squamous blepharitis right eye, upper and lower eyelids: Secondary | ICD-10-CM | POA: Diagnosis not present

## 2020-02-24 DIAGNOSIS — H16223 Keratoconjunctivitis sicca, not specified as Sjogren's, bilateral: Secondary | ICD-10-CM | POA: Diagnosis not present

## 2020-02-24 DIAGNOSIS — H0102B Squamous blepharitis left eye, upper and lower eyelids: Secondary | ICD-10-CM | POA: Diagnosis not present

## 2020-03-18 ENCOUNTER — Telehealth: Payer: Self-pay | Admitting: Adult Health

## 2020-03-18 NOTE — Telephone Encounter (Signed)
Attempted to schedule AWV. Unable to LVM.  Will try at later time.  

## 2020-03-22 ENCOUNTER — Ambulatory Visit (INDEPENDENT_AMBULATORY_CARE_PROVIDER_SITE_OTHER): Payer: Medicare Other

## 2020-03-22 ENCOUNTER — Other Ambulatory Visit: Payer: Self-pay

## 2020-03-22 DIAGNOSIS — Z0000 Encounter for general adult medical examination without abnormal findings: Secondary | ICD-10-CM

## 2020-03-22 DIAGNOSIS — Z1231 Encounter for screening mammogram for malignant neoplasm of breast: Secondary | ICD-10-CM | POA: Diagnosis not present

## 2020-03-22 NOTE — Patient Instructions (Signed)
Rachel Austin , Thank you for taking time to come for your Medicare Wellness Visit. I appreciate your ongoing commitment to your health goals. Please review the following plan we discussed and let me know if I can assist you in the future.   Screening recommendations/referrals: Colonoscopy: Up to date, next due 04/04/2022 Mammogram: Currently due, orders placed this visit  Bone Density: Not required at this time  Recommended yearly ophthalmology/optometry visit for glaucoma screening and checkup Recommended yearly dental visit for hygiene and checkup  Vaccinations: Influenza vaccine: Currently due, you may receive in our office or at your local pharmacy  Pneumococcal vaccine: Not required until age 64 Tdap vaccine: up to date, next due 12/29/2024 Shingles vaccine: Currently due for shingrix, you may receive this vaccine at your local pharmacy.    Advanced directives: Advance directive discussed with you today. Even though you declined this today please call our office should you change your mind and we can give you the proper paperwork for you to fill out.   Conditions/risks identified: None   Next appointment: 03/23/2021 @ 10:30 am at [employer] [facility] Nurse Health Advisor   Preventive Care 40-64 Years, Female Preventive care refers to lifestyle choices and visits with your health care provider that can promote health and wellness. What does preventive care include?  A yearly physical exam. This is also called an annual well check.  Dental exams once or twice a year.  Routine eye exams. Ask your health care provider how often you should have your eyes checked.  Personal lifestyle choices, including:  Daily care of your teeth and gums.  Regular physical activity.  Eating a healthy diet.  Avoiding tobacco and drug use.  Limiting alcohol use.  Practicing safe sex.  Taking low-dose aspirin daily starting at age 69.  Taking vitamin and mineral supplements as  recommended by your health care provider. What happens during an annual well check? The services and screenings done by your health care provider during your annual well check will depend on your age, overall health, lifestyle risk factors, and family history of disease. Counseling  Your health care provider may ask you questions about your:  Alcohol use.  Tobacco use.  Drug use.  Emotional well-being.  Home and relationship well-being.  Sexual activity.  Eating habits.  Work and work Astronomer.  Method of birth control.  Menstrual cycle.  Pregnancy history. Screening  You may have the following tests or measurements:  Height, weight, and BMI.  Blood pressure.  Lipid and cholesterol levels. These may be checked every 5 years, or more frequently if you are over 51 years old.  Skin check.  Lung cancer screening. You may have this screening every year starting at age 26 if you have a 30-pack-year history of smoking and currently smoke or have quit within the past 15 years.  Fecal occult blood test (FOBT) of the stool. You may have this test every year starting at age 41.  Flexible sigmoidoscopy or colonoscopy. You may have a sigmoidoscopy every 5 years or a colonoscopy every 10 years starting at age 45.  Hepatitis C blood test.  Hepatitis B blood test.  Sexually transmitted disease (STD) testing.  Diabetes screening. This is done by checking your blood sugar (glucose) after you have not eaten for a while (fasting). You may have this done every 1-3 years.  Mammogram. This may be done every 1-2 years. Talk to your health care provider about when you should start having regular mammograms. This  may depend on whether you have a family history of breast cancer.  BRCA-related cancer screening. This may be done if you have a family history of breast, ovarian, tubal, or peritoneal cancers.  Pelvic exam and Pap test. This may be done every 3 years starting at age 27.  Starting at age 61, this may be done every 5 years if you have a Pap test in combination with an HPV test.  Bone density scan. This is done to screen for osteoporosis. You may have this scan if you are at high risk for osteoporosis. Discuss your test results, treatment options, and if necessary, the need for more tests with your health care provider. Vaccines  Your health care provider may recommend certain vaccines, such as:  Influenza vaccine. This is recommended every year.  Tetanus, diphtheria, and acellular pertussis (Tdap, Td) vaccine. You may need a Td booster every 10 years.  Zoster vaccine. You may need this after age 73.  Pneumococcal 13-valent conjugate (PCV13) vaccine. You may need this if you have certain conditions and were not previously vaccinated.  Pneumococcal polysaccharide (PPSV23) vaccine. You may need one or two doses if you smoke cigarettes or if you have certain conditions. Talk to your health care provider about which screenings and vaccines you need and how often you need them. This information is not intended to replace advice given to you by your health care provider. Make sure you discuss any questions you have with your health care provider. Document Released: 06/04/2015 Document Revised: 01/26/2016 Document Reviewed: 03/09/2015 Elsevier Interactive Patient Education  2017 [place] Prevention in the Home Falls can cause injuries. They can happen to people of all ages. There are many things you can do to make your home safe and to help prevent falls. What can I do on the outside of my home?  Regularly fix the edges of walkways and driveways and fix any cracks.  Remove anything that might make you trip as you walk through a door, such as a raised step or threshold.  Trim any bushes or trees on the path to your home.  Use bright outdoor lighting.  Clear any walking paths of anything that might make someone trip, such as rocks or  tools.  Regularly check to see if handrails are loose or broken. Make sure that both sides of any steps have handrails.  Any raised decks and porches should have guardrails on the edges.  Have any leaves, snow, or ice cleared regularly.  Use sand or salt on walking paths during winter.  Clean up any spills in your garage right away. This includes oil or grease spills. What can I do in the bathroom?  Use night lights.  Install grab bars by the toilet and in the tub and shower. Do not use towel bars as grab bars.  Use non-skid mats or decals in the tub or shower.  If you need to sit down in the shower, use a plastic, non-slip stool.  Keep the floor dry. Clean up any water that spills on the floor as soon as it happens.  Remove soap buildup in the tub or shower regularly.  Attach bath mats securely with double-sided non-slip rug tape.  Do not have throw rugs and other things on the floor that can make you trip. What can I do in the bedroom?  Use night lights.  Make sure that you have a light by your bed that is easy to reach.  Do not  use any sheets or blankets that are too big for your bed. They should not hang down onto the floor.  Have a firm chair that has side arms. You can use this for support while you get dressed.  Do not have throw rugs and other things on the floor that can make you trip. What can I do in the kitchen?  Clean up any spills right away.  Avoid walking on wet floors.  Keep items that you use a lot in easy-to-reach places.  If you need to reach something above you, use a strong step stool that has a grab bar.  Keep electrical cords out of the way.  Do not use floor polish or wax that makes floors slippery. If you must use wax, use non-skid floor wax.  Do not have throw rugs and other things on the floor that can make you trip. What can I do with my stairs?  Do not leave any items on the stairs.  Make sure that there are handrails on both  sides of the stairs and use them. Fix handrails that are broken or loose. Make sure that handrails are as long as the stairways.  Check any carpeting to make sure that it is firmly attached to the stairs. Fix any carpet that is loose or worn.  Avoid having throw rugs at the top or bottom of the stairs. If you do have throw rugs, attach them to the floor with carpet tape.  Make sure that you have a light switch at the top of the stairs and the bottom of the stairs. If you do not have them, ask someone to add them for you. What else can I do to help prevent falls?  Wear shoes that:  Do not have high heels.  Have rubber bottoms.  Are comfortable and fit you well.  Are closed at the toe. Do not wear sandals.  If you use a stepladder:  Make sure that it is fully opened. Do not climb a closed stepladder.  Make sure that both sides of the stepladder are locked into place.  Ask someone to hold it for you, if possible.  Clearly mark and make sure that you can see:  Any grab bars or handrails.  First and last steps.  Where the edge of each step is.  Use tools that help you move around (mobility aids) if they are needed. These include:  Canes.  Walkers.  Scooters.  Crutches.  Turn on the lights when you go into a dark area. Replace any light bulbs as soon as they burn out.  Set up your furniture so you have a clear path. Avoid moving your furniture around.  If any of your floors are uneven, fix them.  If there are any pets around you, be aware of where they are.  Review your medicines with your doctor. Some medicines can make you feel dizzy. This can increase your chance of falling. Ask your doctor what other things that you can do to help prevent falls. This information is not intended to replace advice given to you by your health care provider. Make sure you discuss any questions you have with your health care provider. Document Released: 03/04/2009 Document Revised:  10/14/2015 Document Reviewed: 06/12/2014 Elsevier Interactive Patient Education  2017 [employer].

## 2020-03-22 NOTE — Progress Notes (Addendum)
Subjective:   Rachel Austin is a 64 y.o. female who presents for an Initial Medicare Annual Wellness Visit.  I connected with Marcelino Scot today by telephone and verified that I am speaking with the correct person using two identifiers. Location patient: home Location provider: work Persons participating in the virtual visit: patient, provider.   I discussed the limitations, risks, security and privacy concerns of performing an evaluation and management service by telephone and the availability of in person appointments. I also discussed with the patient that there may be a patient responsible charge related to this service. The patient expressed understanding and verbally consented to this telephonic visit.    Interactive audio and video telecommunications were attempted between this provider and patient, however failed, due to patient having technical difficulties OR patient did not have access to video capability.  We continued and completed visit with audio only.     Review of Systems    N/A  Cardiac Risk Factors include: hypertension     Objective:    Today's Vitals   There is no height or weight on file to calculate BMI.  Advanced Directives 03/22/2020 11/11/2019 05/31/2018 05/24/2018 04/04/2017  Does Patient Have a Medical Advance Directive? No No No No No  Would patient like information on creating a medical advance directive? No - Patient declined No - Patient declined Yes (MAU/Ambulatory/Procedural Areas - Information given) Yes (MAU/Ambulatory/Procedural Areas - Information given) -    Current Medications (verified) Outpatient Encounter Medications as of 03/22/2020  Medication Sig  . bisoprolol-hydrochlorothiazide (ZIAC) 5-6.25 MG tablet Take 1 tablet by mouth daily.  . Calcium Carb-Cholecalciferol (CALCIUM + D3 PO) Take 1 tablet by mouth daily.  . citalopram (CELEXA) 20 MG tablet Take 1 tablet by mouth once daily  . Ferrous Sulfate Dried (SLOW RELEASE IRON) 45 MG  TBCR Take 1 tablet by mouth 2 (two) times daily.  . folic acid (FOLVITE) 1 MG tablet Take 1 mg by mouth daily.  . methotrexate 2.5 MG tablet Take 25 mg by mouth once a week. Thursdays  . Multiple Vitamins-Minerals (CENTRUM SILVER ULTRA WOMENS PO) Take 1 tablet by mouth daily.   . polyethylene glycol (MIRALAX / GLYCOLAX) packet 17grams in 6 oz of something to drink twice a day until bowel movement.  LAXITIVE.  Restart if two days since last bowel movement   No facility-administered encounter medications on file as of 03/22/2020.    Allergies (verified) Patient has no known allergies.   History: Past Medical History:  Diagnosis Date  . Acute blood loss anemia 06/01/2018  . Anemia   . Anemia, iron deficiency 06/01/2018  . Cataract 2018   small, bilateral  . Depression   . Hyperlipidemia   . Hypertension   . Rheumatoid arthritis ([place])    on meds   Past Surgical History:  Procedure Laterality Date  . FINGER SURGERY  01/29/2017   last 2 fingers left hand  . FOOT SURGERY  2016   Bil/ foot reconstruction due to RA  . KNEE SURGERY Bilateral    Right knee-2010, Left knee -2006  . 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 Heart disease Mother        pacemaker, hypertensin   Social History   Socioeconomic History  .  Marital status: Single    Spouse name: Not on file  . Number of children: Not on file  . Years of education: Not on file  . Highest education level: Not on file  Occupational History  . Not on file  Tobacco Use  . Smoking status: Never Smoker  . Smokeless tobacco: Never Used  Substance and Sexual Activity  . Alcohol use: No    Alcohol/week: 0.0 standard drinks  . Drug use: No  . Sexual activity: Never  Other Topics Concern  . Not on file    Social History Narrative   Moved from [place] to [place] in December   Social Determinants of Health   Financial Resource Strain: Low Risk   . Difficulty of Paying Living Expenses: Not hard at all  Food Insecurity: No Food Insecurity  . Worried About Charity fundraiser in the Last Year: Never true  . Ran Out of Food in the Last Year: Never true  Transportation Needs: No Transportation Needs  . Lack of Transportation (Medical): No  . Lack of Transportation (Non-Medical): No  Physical Activity: Inactive  . Days of Exercise per Week: 0 days  . Minutes of Exercise per Session: 0 min  Stress: No Stress Concern Present  . Feeling of Stress : Not at all  Social Connections: Socially Isolated  . Frequency of Communication with Friends and Family: More than three times a week  . Frequency of Social Gatherings with Friends and Family: More than three times a week  . Attends Religious Services: Never  . Active Member of Clubs or Organizations: No  . Attends Archivist Meetings: Never  . Marital Status: Never married    Tobacco Counseling Counseling given: Not Answered   Clinical Intake:  Pre-visit preparation completed: Yes  Pain : No/denies pain     Nutritional Risks: None Diabetes: No  How often do you need to have someone help you when you read instructions, pamphlets, or other written materials from your doctor or pharmacy?: 1 - Never What is the last grade level you completed in school?: High School  Diabetic?No     Information entered by :: [place] of Daily Living In your present state of health, do you have any difficulty performing the following activities: 03/22/2020  Hearing? N  Vision? N  Difficulty concentrating or making decisions? N  Walking or climbing stairs? N  Dressing or bathing? N  Doing errands, shopping? Y  Preparing Food and eating ? N  Using the Toilet? N  In the past six months, have you accidently leaked urine? N  Do you  have problems with loss of bowel control? N  Managing your Medications? N  Managing your Finances? N  Housekeeping or managing your Housekeeping? N  Some recent data might be hidden    Patient Care Team: Dorothyann Peng, NP as PCP - General (Family Medicine) Latanya Maudlin, MD as Consulting Physician (Orthopedic Surgery) [name], MD as Consulting Physician (Orthopedic Surgery) Ob/Gyn, [facility] any recent [place] you may have received from other than [facility] providers in the past year (date may be approximate).     Assessment:   This is a routine wellness examination for Rachel Austin.  Hearing/Vision screen  Hearing Screening   125Hz  250Hz  500Hz  1000Hz  2000Hz  3000Hz  4000Hz  6000Hz  8000Hz   Right ear:           Left ear:           Vision Screening Comments: Patient  states gets eye checked yearly. Has dry eyes  Dietary issues and exercise activities discussed: Current Exercise Habits: The patient does not participate in regular exercise at present  Goals    . Exercise 3x per week (30 min per time)      Depression Screen PHQ 2/9 Scores 03/22/2020 06/06/2019 11/08/2016 12/30/2014 07/16/2014  PHQ - 2 Score 0 2 0 4 6  PHQ- 9 Score 0 3 - 10 13    Fall Risk Fall Risk  03/22/2020 06/06/2019 12/30/2014 07/16/2014  Falls in the past year? 0 0 No No  Number falls in past yr: 0 - - -  Injury with Fall? 0 - - -  Risk for fall due to : No Fall Risks - - -  Follow up Falls evaluation completed;Falls prevention discussed - - -    Any stairs in or around the home? No  If so, are there any without handrails? No  Home free of loose throw rugs in walkways, pet beds, electrical cords, etc? Yes  Adequate lighting in your home to reduce risk of falls? Yes   ASSISTIVE DEVICES UTILIZED TO PREVENT FALLS:  Life alert? No  Use of a cane, walker or w/c? No  Grab bars in the bathroom? No  Shower chair or bench in shower? No  Elevated toilet seat or a handicapped toilet? Yes      Cognitive Function:        Immunizations Immunization History  Administered Date(s) Administered  . Influenza,inj,Quad PF,6+ Mos 03/01/2015, 01/31/2018, 06/06/2019  . PFIZER SARS-COV-2 Vaccination 02/19/2020, 03/17/2020  . Tdap 12/30/2014    TDAP status: Up to date Flu Vaccine status: Declined, Education has been provided regarding the importance of this vaccine but patient still declined. Advised may receive this vaccine at local pharmacy or Health Dept. Aware to provide a copy of the vaccination record if obtained from local pharmacy or Health Dept. Verbalized acceptance and understanding. Pneumococcal vaccine status: Up to date Covid-19 vaccine status: Completed vaccines  Qualifies for Shingles Vaccine? Yes   Zostavax completed No   Shingrix Completed?: No.    Education has been provided regarding the importance of this vaccine. Patient has been advised to call insurance company to determine out of pocket expense if they have not yet received this vaccine. Advised may also receive vaccine at local pharmacy or Health Dept. Verbalized acceptance and understanding.  Screening Tests Health Maintenance  Topic Date Due  . PAP SMEAR-Modifier  Never done  . MAMMOGRAM  04/02/2016  . INFLUENZA VACCINE  12/21/2019  . COLONOSCOPY  04/04/2022  . TETANUS/TDAP  12/29/2024  . COVID-19 Vaccine  Completed  . Hepatitis C Screening  Completed  . HIV Screening  Discontinued    Health Maintenance  Health Maintenance Due  Topic Date Due  . PAP SMEAR-Modifier  Never done  . MAMMOGRAM  04/02/2016  . INFLUENZA VACCINE  12/21/2019    Colorectal cancer screening: Completed 04/04/2017. Repeat every 5 years Mammogram status: Ordered 03/22/2020. Pt provided with contact info and advised to call to schedule appt.  Bone Density Screening: Not indicated at this time   Lung Cancer Screening: (Low Dose CT Chest recommended if Age 18-80 years, 30 pack-year currently smoking OR have quit w/in  15years.) does not qualify.   Lung Cancer Screening Referral: N/A   Additional Screening:  Hepatitis C Screening: does qualify; Completed 11/08/2016  Vision Screening: Recommended annual ophthalmology exams for early detection of glaucoma and other disorders of the eye. Is the patient up to  date with their annual eye exam?  Yes  Who is the provider or what is the name of the office in which the patient attends annual eye exams? Dr. Katy Fitch  If pt is not established with a provider, would they like to be referred to a provider to establish care? No .   Dental Screening: Recommended annual dental exams for proper oral hygiene  Community Resource Referral / Chronic Care Management: CRR required this visit?  No   CCM required this visit?  No      Plan:     I have personally reviewed and noted the following in the patient's chart:   . Medical and social history . Use of alcohol, tobacco or illicit drugs  . Current medications and supplements . Functional ability and status . Nutritional status . Physical activity . Advanced directives . List of other physicians . Hospitalizations, surgeries, and ER visits in previous 12 months . Vitals . Screenings to include cognitive, depression, and falls . Referrals and appointments  In addition, I have reviewed and discussed with patient certain preventive protocols, quality metrics, and best practice recommendations. A written personalized care plan for preventive services as well as general preventive health recommendations were provided to patient.     Ofilia Neas, LPN   97/11/4140   Nurse Notes: None

## 2020-05-10 ENCOUNTER — Ambulatory Visit: Payer: Medicare Other

## 2020-06-17 ENCOUNTER — Ambulatory Visit: Payer: Medicare Other

## 2020-07-26 DIAGNOSIS — M79672 Pain in left foot: Secondary | ICD-10-CM | POA: Diagnosis not present

## 2020-07-26 DIAGNOSIS — L600 Ingrowing nail: Secondary | ICD-10-CM | POA: Diagnosis not present

## 2020-07-26 DIAGNOSIS — M79671 Pain in right foot: Secondary | ICD-10-CM | POA: Diagnosis not present

## 2020-07-26 DIAGNOSIS — M25774 Osteophyte, right foot: Secondary | ICD-10-CM | POA: Diagnosis not present

## 2020-08-05 ENCOUNTER — Other Ambulatory Visit: Payer: Self-pay | Admitting: Adult Health

## 2020-08-05 DIAGNOSIS — I10 Essential (primary) hypertension: Secondary | ICD-10-CM

## 2020-08-24 DIAGNOSIS — H40013 Open angle with borderline findings, low risk, bilateral: Secondary | ICD-10-CM | POA: Diagnosis not present

## 2020-08-24 DIAGNOSIS — H5213 Myopia, bilateral: Secondary | ICD-10-CM | POA: Diagnosis not present

## 2020-08-24 DIAGNOSIS — H0102B Squamous blepharitis left eye, upper and lower eyelids: Secondary | ICD-10-CM | POA: Diagnosis not present

## 2020-08-24 DIAGNOSIS — H524 Presbyopia: Secondary | ICD-10-CM | POA: Diagnosis not present

## 2020-08-24 DIAGNOSIS — H16223 Keratoconjunctivitis sicca, not specified as Sjogren's, bilateral: Secondary | ICD-10-CM | POA: Diagnosis not present

## 2020-08-24 DIAGNOSIS — H52203 Unspecified astigmatism, bilateral: Secondary | ICD-10-CM | POA: Diagnosis not present

## 2020-08-24 DIAGNOSIS — H0102A Squamous blepharitis right eye, upper and lower eyelids: Secondary | ICD-10-CM | POA: Diagnosis not present

## 2020-08-25 ENCOUNTER — Other Ambulatory Visit: Payer: Self-pay

## 2020-08-26 ENCOUNTER — Ambulatory Visit (INDEPENDENT_AMBULATORY_CARE_PROVIDER_SITE_OTHER): Payer: Medicare Other | Admitting: Adult Health

## 2020-08-26 ENCOUNTER — Encounter: Payer: Self-pay | Admitting: Adult Health

## 2020-08-26 VITALS — BP 120/74 | HR 49 | Temp 98.6°F | Ht 60.5 in | Wt 134.2 lb

## 2020-08-26 DIAGNOSIS — I10 Essential (primary) hypertension: Secondary | ICD-10-CM

## 2020-08-26 DIAGNOSIS — M1990 Unspecified osteoarthritis, unspecified site: Secondary | ICD-10-CM | POA: Diagnosis not present

## 2020-08-26 DIAGNOSIS — F329 Major depressive disorder, single episode, unspecified: Secondary | ICD-10-CM

## 2020-08-26 DIAGNOSIS — D508 Other iron deficiency anemias: Secondary | ICD-10-CM | POA: Diagnosis not present

## 2020-08-26 DIAGNOSIS — H16221 Keratoconjunctivitis sicca, not specified as Sjogren's, right eye: Secondary | ICD-10-CM | POA: Diagnosis not present

## 2020-08-26 DIAGNOSIS — M25529 Pain in unspecified elbow: Secondary | ICD-10-CM | POA: Diagnosis not present

## 2020-08-26 DIAGNOSIS — Z0000 Encounter for general adult medical examination without abnormal findings: Secondary | ICD-10-CM

## 2020-08-26 DIAGNOSIS — M0580 Other rheumatoid arthritis with rheumatoid factor of unspecified site: Secondary | ICD-10-CM | POA: Diagnosis not present

## 2020-08-26 DIAGNOSIS — Z79899 Other long term (current) drug therapy: Secondary | ICD-10-CM | POA: Diagnosis not present

## 2020-08-26 DIAGNOSIS — M0579 Rheumatoid arthritis with rheumatoid factor of multiple sites without organ or systems involvement: Secondary | ICD-10-CM | POA: Diagnosis not present

## 2020-08-26 DIAGNOSIS — M05721 Rheumatoid arthritis with rheumatoid factor of right elbow without organ or systems involvement: Secondary | ICD-10-CM

## 2020-08-26 DIAGNOSIS — M25511 Pain in right shoulder: Secondary | ICD-10-CM | POA: Diagnosis not present

## 2020-08-26 DIAGNOSIS — M79643 Pain in unspecified hand: Secondary | ICD-10-CM | POA: Diagnosis not present

## 2020-08-26 LAB — CBC WITH DIFFERENTIAL/PLATELET
Basophils Absolute: 0 10*3/uL (ref 0.0–0.1)
Basophils Relative: 0.8 % (ref 0.0–3.0)
Eosinophils Absolute: 0.2 10*3/uL (ref 0.0–0.7)
Eosinophils Relative: 3 % (ref 0.0–5.0)
HCT: 35.5 % — ABNORMAL LOW (ref 36.0–46.0)
Hemoglobin: 11.6 g/dL — ABNORMAL LOW (ref 12.0–15.0)
Lymphocytes Relative: 44.2 % (ref 12.0–46.0)
Lymphs Abs: 2.8 10*3/uL (ref 0.7–4.0)
MCHC: 32.6 g/dL (ref 30.0–36.0)
MCV: 90.5 fl (ref 78.0–100.0)
Monocytes Absolute: 0.5 10*3/uL (ref 0.1–1.0)
Monocytes Relative: 8.3 % (ref 3.0–12.0)
Neutro Abs: 2.8 10*3/uL (ref 1.4–7.7)
Neutrophils Relative %: 43.7 % (ref 43.0–77.0)
Platelets: 339 10*3/uL (ref 150.0–400.0)
RBC: 3.92 Mil/uL (ref 3.87–5.11)
RDW: 15.5 % (ref 11.5–15.5)
WBC: 6.3 10*3/uL (ref 4.0–10.5)

## 2020-08-26 LAB — IBC + FERRITIN
Ferritin: 78.5 ng/mL (ref 10.0–291.0)
Iron: 42 ug/dL (ref 42–145)
Saturation Ratios: 12.7 % — ABNORMAL LOW (ref 20.0–50.0)
Transferrin: 236 mg/dL (ref 212.0–360.0)

## 2020-08-26 LAB — COMPREHENSIVE METABOLIC PANEL
ALT: 8 U/L (ref 0–35)
AST: 16 U/L (ref 0–37)
Albumin: 4.1 g/dL (ref 3.5–5.2)
Alkaline Phosphatase: 86 U/L (ref 39–117)
BUN: 21 mg/dL (ref 6–23)
CO2: 30 mEq/L (ref 19–32)
Calcium: 9.8 mg/dL (ref 8.4–10.5)
Chloride: 103 mEq/L (ref 96–112)
Creatinine, Ser: 0.55 mg/dL (ref 0.40–1.20)
GFR: 97.19 mL/min (ref >60.00)
Glucose, Bld: 88 mg/dL (ref 70–99)
Potassium: 4.4 mEq/L (ref 3.5–5.1)
Sodium: 140 mEq/L (ref 135–145)
Total Bilirubin: 0.4 mg/dL (ref 0.2–1.2)
Total Protein: 7.8 g/dL (ref 6.0–8.3)

## 2020-08-26 LAB — LIPID PANEL
Cholesterol: 207 mg/dL — ABNORMAL HIGH (ref 0–200)
HDL: 54.9 mg/dL (ref >39.00)
LDL Cholesterol: 130 mg/dL — ABNORMAL HIGH (ref 0–99)
NonHDL: 152.59
Total CHOL/HDL Ratio: 4
Triglycerides: 112 mg/dL (ref 0.0–149.0)
VLDL: 22.4 mg/dL (ref 0.0–40.0)

## 2020-08-26 LAB — TSH: TSH: 1.63 u[IU]/mL (ref 0.35–4.50)

## 2020-08-26 MED ORDER — CITALOPRAM HYDROBROMIDE 20 MG PO TABS: 20.0000 mg | ORAL_TABLET | Freq: Every day | ORAL | 1 refills | Status: DC

## 2020-08-26 NOTE — Progress Notes (Signed)
Subjective:    Patient ID: Rachel Austin, female    DOB: 20-Jul-1955, 65 y.o.   MRN: [id number]  HPI Patient presents for yearly preventative medicine examination. She is a pleasant 65 year old female who  has a past medical history of Acute blood loss anemia (06/01/2018), Anemia, Anemia, iron deficiency (06/01/2018), Cataract (2018), Depression, Hyperlipidemia, Hypertension, and Rheumatoid arthritis ([place]).  Essential Hypertension-takes Ziac 5-6.25 mg daily.  She denies episodes of dizziness, lightheadedness, chest pain, or shortness of breath BP Readings from Last 3 Encounters:  08/26/20 120/74  11/11/19 113/62  06/06/19 120/80   Depression -is been well controlled on Celexa 20 mg in the past.  She continues to feel as though her depression symptoms are well controlled on this dose.  Denies suicidal ideation  RA -is followed by rheumatology, currently on Humira and methotrexate 25 mg weekly. Continues to have multiple joint pain, especially in the elbows and right shoulder. Next appt is this afternoon   Iron Deficiency Anemia - takes OTC iron supplement    All immunizations and health maintenance protocols were reviewed with the patient and needed orders were placed.  Appropriate screening laboratory values were ordered for the patient including screening of hyperlipidemia, renal function and hepatic function.  Medication reconciliation,  past medical history, social history, problem list and allergies were reviewed in detail with the patient  Goals were established with regard to weight loss, exercise, and  diet in compliance with medications Wt Readings from Last 3 Encounters:  08/26/20 134 lb 3.2 oz (60.9 kg)  11/11/19 130 lb (59 kg)  06/06/19 140 lb (63.5 kg)   She will be traveling to [place] next month to visit her family. She will be gone for two months  Review of Systems  Constitutional: Negative.   HENT: Negative.   Eyes: Negative.   Respiratory: Negative.    Cardiovascular: Negative.   Gastrointestinal: Negative.   Endocrine: Negative.   Genitourinary: Negative.   Musculoskeletal: Positive for arthralgias.  Skin: Negative.   Allergic/Immunologic: Negative.   Neurological: Negative.   Hematological: Negative.   Psychiatric/Behavioral: Negative.    Past Medical History:  Diagnosis Date  . Acute blood loss anemia 06/01/2018  . Anemia   . Anemia, iron deficiency 06/01/2018  . Cataract 2018   small, bilateral  . Depression   . Hyperlipidemia   . Hypertension   . Rheumatoid arthritis ([place])    on meds    Social History   Socioeconomic History  . Marital status: Single    Spouse name: Not on file  . Number of children: Not on file  . Years of education: Not on file  . Highest education level: Not on file  Occupational History  . Not on file  Tobacco Use  . Smoking status: Never Smoker  . Smokeless tobacco: Never Used  Substance and Sexual Activity  . Alcohol use: No    Alcohol/week: 0.0 standard drinks  . Drug use: No  . Sexual activity: Never  Other Topics Concern  . Not on file  Social History Narrative   Moved from [place] to [facility] in December   Social Determinants of Health   Financial Resource Strain: Low Risk   . Difficulty of Paying Living Expenses: Not hard at all  Food Insecurity: No Food Insecurity  . Worried About Charity fundraiser in the Last Year: Never true  . Ran Out of Food in the Last Year: Never true  Transportation Needs: No Transportation Needs  . Lack of  Transportation (Medical): No  . Lack of Transportation (Non-Medical): No  Physical Activity: Inactive  . Days of Exercise per Week: 0 days  . Minutes of Exercise per Session: 0 min  Stress: No Stress Concern Present  . Feeling of Stress : Not at all  Social Connections: Socially Isolated  . Frequency of Communication with Friends and Family: More than three times a week  . Frequency of Social Gatherings with Friends and Family: More than three times  a week  . Attends Religious Services: Never  . Active Member of Clubs or Organizations: No  . Attends Archivist Meetings: Never  . Marital Status: Never married  Intimate Partner Violence: Not At Risk  . Fear of Current or Ex-Partner: No  . Emotionally Abused: No  . Physically Abused: No  . Sexually Abused: No    Past Surgical History:  Procedure Laterality Date  . FINGER SURGERY  01/29/2017   last 2 fingers left hand  . FOOT SURGERY  2016   Bil/ foot reconstruction due to RA  . KNEE SURGERY Bilateral    Right knee-2010, Left knee -2006  . 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 Heart disease Mother        pacemaker, hypertensin    No Known Allergies  Current Outpatient Medications on File Prior to Visit  Medication Sig Dispense Refill  . bisoprolol-hydrochlorothiazide (ZIAC) 5-6.25 MG tablet Take 1 tablet by mouth once daily 90 tablet 0  . Calcium Carb-Cholecalciferol (CALCIUM + D3 PO) Take 1 tablet by mouth daily.    . citalopram (CELEXA) 20 MG tablet Take 1 tablet by mouth once daily 90 tablet 1  . Ferrous Sulfate Dried (SLOW RELEASE IRON) 45 MG TBCR Take 1 tablet by mouth 2 (two) times daily. (Patient taking differently: Take 1 tablet by mouth daily.) 60 tablet 0  . folic acid (FOLVITE) 1 MG tablet Take 1 mg by mouth daily.    . methotrexate 2.5 MG tablet Take 25 mg by mouth once a week. Thursdays    . Multiple Vitamins-Minerals (CENTRUM SILVER ULTRA WOMENS PO) Take 1 tablet by mouth daily.      No current facility-administered medications on file prior to visit.    BP 120/74 (BP Location: Left Arm, Patient Position: Sitting, Cuff Size: Normal)   Pulse (!) 49   Temp 98.6 F (37 C) (Oral)   Ht 5' 0.5" (1.537 m)   Wt 134 lb 3.2 oz  (60.9 kg)   SpO2 96%   BMI 25.78 kg/m       Objective:   Physical Exam Vitals and nursing note reviewed.  Constitutional:      General: She is not in acute distress.    Appearance: Normal appearance. She is well-developed. She is not ill-appearing.  HENT:     Head: Normocephalic and atraumatic.     Right Ear: Tympanic membrane, ear canal and external ear normal. There is no impacted cerumen.     Left Ear: Tympanic membrane, ear canal and external ear normal. There is no impacted cerumen.     Nose: Nose normal. No congestion or rhinorrhea.     Mouth/Throat:     Mouth: Mucous membranes are moist.     Pharynx: Oropharynx is clear. No  oropharyngeal exudate or posterior oropharyngeal erythema.  Eyes:     General:        Right eye: No discharge.        Left eye: No discharge.     Extraocular Movements: Extraocular movements intact.     Conjunctiva/sclera: Conjunctivae normal.     Pupils: Pupils are equal, round, and reactive to light.  Neck:     Thyroid: No thyromegaly.     Vascular: No carotid bruit.     Trachea: No tracheal deviation.  Cardiovascular:     Rate and Rhythm: Normal rate and regular rhythm.     Pulses: Normal pulses.     Heart sounds: Normal heart sounds. No murmur heard. No friction rub. No gallop.   Pulmonary:     Effort: Pulmonary effort is normal. No respiratory distress.     Breath sounds: Normal breath sounds. No stridor. No wheezing, rhonchi or rales.  Chest:     Chest wall: No tenderness.  Abdominal:     General: Abdomen is flat. Bowel sounds are normal. There is no distension.     Palpations: Abdomen is soft. There is no mass.     Tenderness: There is no abdominal tenderness. There is no right CVA tenderness, left CVA tenderness, guarding or rebound.     Hernia: No hernia is present.  Musculoskeletal:        General: Deformity present. No swelling, tenderness or signs of injury. Normal range of motion.     Cervical back: Normal range of motion and  neck supple.     Right lower leg: No edema.     Left lower leg: No edema.     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  Skin:    General: Skin is warm and dry.     Coloration: Skin is not jaundiced or pale.     Findings: No bruising, erythema, lesion or rash.  Neurological:     General: No focal deficit present.     Mental Status: She is alert and oriented to person, place, and time.     Cranial Nerves: No cranial nerve deficit.     Sensory: No sensory deficit.     Motor: No weakness.     Coordination: Coordination normal.     Gait: Gait normal.     Deep Tendon Reflexes: Reflexes normal.  Psychiatric:        Mood and Affect: Mood normal.        Behavior: Behavior normal.        Thought Content: Thought content normal.        Judgment: Judgment normal.       Assessment & Plan:  1. Routine general medical examination at a health care facility - Follow up in one year or sooner if needed - CBC with Differential/Platelet; Future - Comprehensive metabolic panel; Future - Lipid panel; Future - TSH; Future - TSH - Lipid panel - Comprehensive metabolic panel - CBC with Differential/Platelet  2. Essential hypertension - BP controlled. No changes in medications  - CBC with Differential/Platelet; Future - Comprehensive metabolic panel; Future - Lipid panel; Future - TSH; Future - TSH - Lipid panel - Comprehensive metabolic panel - CBC with Differential/Platelet  3. Reactive depression - Continue with Celexa - controlled  - CBC with Differential/Platelet; Future - Comprehensive metabolic panel; Future - Lipid panel; Future - TSH; Future - citalopram (CELEXA) 20 MG tablet; Take 1 tablet (20  mg total) by mouth daily.  Dispense: 90 tablet; Refill: 1 - TSH - Lipid panel - Comprehensive metabolic panel - CBC with Differential/Platelet  4. 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 Other iron deficiency anemia  - CBC with Differential/Platelet; Future - Comprehensive metabolic panel; Future - Lipid panel; Future - TSH; Future - IBC + Ferritin; Future - IBC + Ferritin - TSH - Lipid panel - Comprehensive metabolic panel - CBC with Differential/Platelet  Dorothyann Peng, NP

## 2020-09-02 DIAGNOSIS — Z1231 Encounter for screening mammogram for malignant neoplasm of breast: Secondary | ICD-10-CM | POA: Diagnosis not present

## 2020-11-28 ENCOUNTER — Other Ambulatory Visit: Payer: Self-pay | Admitting: Adult Health

## 2020-11-28 DIAGNOSIS — I10 Essential (primary) hypertension: Secondary | ICD-10-CM

## 2021-02-17 ENCOUNTER — Ambulatory Visit (INDEPENDENT_AMBULATORY_CARE_PROVIDER_SITE_OTHER): Payer: Medicare Other | Admitting: Adult Health

## 2021-02-17 ENCOUNTER — Ambulatory Visit (INDEPENDENT_AMBULATORY_CARE_PROVIDER_SITE_OTHER): Payer: Medicare Other

## 2021-02-17 ENCOUNTER — Other Ambulatory Visit: Payer: Self-pay

## 2021-02-17 ENCOUNTER — Encounter: Payer: Self-pay | Admitting: Adult Health

## 2021-02-17 VITALS — BP 120/70 | HR 48 | Temp 98.6°F | Ht 60.05 in | Wt 146.0 lb

## 2021-02-17 DIAGNOSIS — R55 Syncope and collapse: Secondary | ICD-10-CM | POA: Diagnosis not present

## 2021-02-17 DIAGNOSIS — R519 Headache, unspecified: Secondary | ICD-10-CM | POA: Diagnosis not present

## 2021-02-17 DIAGNOSIS — M542 Cervicalgia: Secondary | ICD-10-CM

## 2021-02-17 DIAGNOSIS — S0993XA Unspecified injury of face, initial encounter: Secondary | ICD-10-CM | POA: Diagnosis not present

## 2021-02-17 DIAGNOSIS — S63260A Dislocation of metacarpophalangeal joint of right index finger, initial encounter: Secondary | ICD-10-CM | POA: Diagnosis not present

## 2021-02-17 DIAGNOSIS — M25562 Pain in left knee: Secondary | ICD-10-CM

## 2021-02-17 DIAGNOSIS — M79641 Pain in right hand: Secondary | ICD-10-CM

## 2021-02-17 MED ORDER — TRAMADOL HCL 50 MG PO TABS: 50.0000 mg | ORAL_TABLET | Freq: Two times a day (BID) | ORAL | 0 refills | Status: AC | PRN

## 2021-02-17 NOTE — Progress Notes (Signed)
Subjective:    Patient ID: Rachel Austin, female    DOB: Dec 17, 1955, 65 y.o.   MRN: [id number]  HPI  65 year old female who  has a past medical history of Acute blood loss anemia (06/01/2018), Anemia, Anemia, iron deficiency (06/01/2018), Cataract (2018), Depression, Hyperlipidemia, Hypertension, and Rheumatoid arthritis ([place]).  She presents to the office today with her daughter for multiple falls over the last two weeks  She reports that about 2 weeks ago she had a fall at home where she slipped going up the stairs while wearing her slippers.  This fall did not result in injury.  4 days ago while at home she fell again, this time her daughter believes that she passed out.  Patient does not remember the fall.  Does not remember if she had any chest pain, shortness of breath, dizziness, or lightheadedness.  She fell face first onto a wooden floor, at the time she was holding them up and believes that the mop handle struck her in the face as well.  He refused going to the ER.  He has significant bruising and swelling to her right face.  He also injured her right hand and has bruising and swelling noted.  Additionally she complains of cervical spine pain as well as pain, bruising, and swelling to her left knee where she has a history of knee replacement.  No signs of epilepsy noted  At home she has been using Advil without relief.  She endorses headaches but no blurred vision  Review of Systems See HPI   Past Medical History:  Diagnosis Date   Acute blood loss anemia 06/01/2018   Anemia    Anemia, iron deficiency 06/01/2018   Cataract 2018   small, bilateral   Depression    Hyperlipidemia    Hypertension    Rheumatoid arthritis ([place])    on meds    Social History   Socioeconomic History   Marital status: Single    Spouse name: Not on file   Number of children: Not on file   Years of education: Not on file   Highest education level: Not on file  Occupational History   Not on  file  Tobacco Use   Smoking status: Never   Smokeless tobacco: Never  Substance and Sexual Activity   Alcohol use: No    Alcohol/week: 0.0 standard drinks   Drug use: No   Sexual activity: Never  Other Topics Concern   Not on file  Social History Narrative   Moved from [place] to [place] in December   Social Determinants of Health   Financial Resource Strain: Low Risk    Difficulty of Paying Living Expenses: Not hard at all  Food Insecurity: No Food Insecurity   Worried About Charity fundraiser in the Last Year: Never true   Arboriculturist in the Last Year: Never true  Transportation Needs: No Transportation Needs   Lack of Transportation (Medical): No   Lack of Transportation (Non-Medical): No  Physical Activity: Inactive   Days of Exercise per Week: 0 days   Minutes of Exercise per Session: 0 min  Stress: No Stress Concern Present   Feeling of Stress : Not at all  Social Connections: Socially Isolated   Frequency of Communication with Friends and Family: More than three times a week   Frequency of Social Gatherings with Friends and Family: More than three times a week   Attends Religious Services: Never   Retail buyer of [employer]  or Organizations: No   Attends Archivist Meetings: Never   Marital Status: Never married  Human resources officer Violence: Not At Risk   Fear of Current or Ex-Partner: No   Emotionally Abused: No   Physically Abused: No   Sexually Abused: No    Past Surgical History:  Procedure Laterality Date   FINGER SURGERY  01/29/2017   last 2 fingers left hand   FOOT SURGERY  2016   Bil/ foot reconstruction due to RA   KNEE SURGERY Bilateral    Right knee-2010, Left knee -2006   TOTAL HIP ARTHROPLASTY Right 05/31/2018   Procedure: TOTAL HIP ARTHROPLASTY;  Surgeon: [name], MD;  Location: [facility];  Service: Orthopedics;  Laterality: Right;   VARICOSE VEIN SURGERY  2015   right     Family History  Problem Relation Age of Onset   Heart disease  Mother        pacemaker, hypertensin    No Known Allergies  Current Outpatient Medications on File Prior to Visit  Medication Sig Dispense Refill   bisoprolol-hydrochlorothiazide (ZIAC) 5-6.25 MG tablet Take 1 tablet by mouth once daily 90 tablet 0   Calcium Carb-Cholecalciferol (CALCIUM + D3 PO) Take 1 tablet by mouth daily.     citalopram (CELEXA) 20 MG tablet Take 1 tablet (20 mg total) by mouth daily. 90 tablet 1   Ferrous Sulfate Dried (SLOW RELEASE IRON) 45 MG TBCR Take 1 tablet by mouth 2 (two) times daily. (Patient taking differently: Take 1 tablet by mouth daily.) 60 tablet 0   folic acid (FOLVITE) 1 MG tablet Take 1 mg by mouth daily.     methotrexate 2.5 MG tablet Take 25 mg by mouth once a week. Thursdays     Multiple Vitamins-Minerals (CENTRUM SILVER ULTRA WOMENS PO) Take 1 tablet by mouth daily.      RESTASIS 0.05 % ophthalmic emulsion 1 drop 2 (two) times daily.     RINVOQ 15 MG TB24      No current facility-administered medications on file prior to visit.    BP 120/70   Pulse (!) 48   Temp 98.6 F (37 C) (Oral)   Ht 5' 0.05" (1.525 m)   Wt 146 lb (66.2 kg)   SpO2 98%   BMI 28.47 kg/m       Objective:   Physical Exam Vitals and nursing note reviewed.  Constitutional:      Appearance: Normal appearance.  HENT:     Head: Abrasion present. No raccoon eyes or Battle's sign.     Jaw: Tenderness and pain on movement present.   Cardiovascular:     Rate and Rhythm: Normal rate and regular rhythm.     Pulses: Normal pulses.     Heart sounds: Normal heart sounds.  Pulmonary:     Effort: Pulmonary effort is normal.     Breath sounds: Normal breath sounds.  Musculoskeletal:     Right hand: Swelling, tenderness and bony tenderness present. Decreased range of motion. Decreased strength. Normal capillary refill.     Cervical back: Tenderness present. No bony tenderness. Normal range of motion.     Left knee: Ecchymosis present. Tenderness present over the medial  joint line.  Skin:    General: Skin is warm and dry.     Findings: Bruising present.  Neurological:     General: No focal deficit present.     Mental Status: She is alert and oriented to person, place, and time.  Assessment & Plan:  1. Syncope, unspecified syncope type -Unknown cause.  Will refer to cardiology and neurology for further evaluation - DG Facial Bones 1-2 Views; Future - DG Hand Complete Right; Future - DG Knee 1-2 Views Left; Future - DG Cervical Spine Complete; Future - CBC with Differential/Platelet; Future - Comprehensive metabolic panel; Future - Ambulatory referral to Cardiology - Ambulatory referral to Neurology  2. Facial pain, acute -We will order for x-ray of facial bones in the office today.  Likely need advanced imaging of face and head. -Prescribe short course of tramadol for traumatic pain.  She was advised that this medication can make her sleepy so try it at night first. - DG Facial Bones 1-2 Views; Future - traMADol (ULTRAM) 50 MG tablet; Take 1 tablet (50 mg total) by mouth every 12 (twelve) hours as needed for up to 5 days.  Dispense: 15 tablet; Refill: 0  3. Neck pain  - DG Cervical Spine Complete; Future - traMADol (ULTRAM) 50 MG tablet; Take 1 tablet (50 mg total) by mouth every 12 (twelve) hours as needed for up to 5 days.  Dispense: 15 tablet; Refill: 0  4. Acute pain of left knee  - DG Knee 1-2 Views Left; Future - traMADol (ULTRAM) 50 MG tablet; Take 1 tablet (50 mg total) by mouth every 12 (twelve) hours as needed for up to 5 days.  Dispense: 15 tablet; Refill: 0  5. Right hand pain  - DG Hand Complete Right; Future - traMADol (ULTRAM) 50 MG tablet; Take 1 tablet (50 mg total) by mouth every 12 (twelve) hours as needed for up to 5 days.  Dispense: 15 tablet; Refill: 0  Dorothyann Peng, NP

## 2021-02-18 ENCOUNTER — Other Ambulatory Visit: Payer: Self-pay

## 2021-02-21 ENCOUNTER — Other Ambulatory Visit (INDEPENDENT_AMBULATORY_CARE_PROVIDER_SITE_OTHER): Payer: Medicare Other

## 2021-02-21 ENCOUNTER — Other Ambulatory Visit: Payer: Self-pay

## 2021-02-21 ENCOUNTER — Other Ambulatory Visit: Payer: Medicare Other

## 2021-02-21 DIAGNOSIS — R55 Syncope and collapse: Secondary | ICD-10-CM

## 2021-02-21 LAB — COMPREHENSIVE METABOLIC PANEL
ALT: 21 U/L (ref 0–35)
AST: 27 U/L (ref 0–37)
Albumin: 4.2 g/dL (ref 3.5–5.2)
Alkaline Phosphatase: 62 U/L (ref 39–117)
BUN: 23 mg/dL (ref 6–23)
CO2: 26 mEq/L (ref 19–32)
Calcium: 10.4 mg/dL (ref 8.4–10.5)
Chloride: 102 mEq/L (ref 96–112)
Creatinine, Ser: 0.68 mg/dL (ref 0.40–1.20)
GFR: 92.02 mL/min (ref >60.00)
Glucose, Bld: 88 mg/dL (ref 70–99)
Potassium: 3.9 mEq/L (ref 3.5–5.1)
Sodium: 138 mEq/L (ref 135–145)
Total Bilirubin: 0.6 mg/dL (ref 0.2–1.2)
Total Protein: 7.8 g/dL (ref 6.0–8.3)

## 2021-02-21 LAB — CBC WITH DIFFERENTIAL/PLATELET
Basophils Absolute: 0 10*3/uL (ref 0.0–0.1)
Basophils Relative: 0.3 % (ref 0.0–3.0)
Eosinophils Absolute: 0.1 10*3/uL (ref 0.0–0.7)
Eosinophils Relative: 1.2 % (ref 0.0–5.0)
HCT: 33.6 % — ABNORMAL LOW (ref 36.0–46.0)
Hemoglobin: 11.3 g/dL — ABNORMAL LOW (ref 12.0–15.0)
Lymphocytes Relative: 56.5 % — ABNORMAL HIGH (ref 12.0–46.0)
Lymphs Abs: 3.5 10*3/uL (ref 0.7–4.0)
MCHC: 33.6 g/dL (ref 30.0–36.0)
MCV: 91.8 fl (ref 78.0–100.0)
Monocytes Absolute: 0.5 10*3/uL (ref 0.1–1.0)
Monocytes Relative: 8.1 % (ref 3.0–12.0)
Neutro Abs: 2.1 10*3/uL (ref 1.4–7.7)
Neutrophils Relative %: 33.9 % — ABNORMAL LOW (ref 43.0–77.0)
Platelets: 314 10*3/uL (ref 150.0–400.0)
RBC: 3.67 Mil/uL — ABNORMAL LOW (ref 3.87–5.11)
RDW: 14.8 % (ref 11.5–15.5)
WBC: 6.2 10*3/uL (ref 4.0–10.5)

## 2021-02-23 ENCOUNTER — Encounter: Payer: Self-pay | Admitting: Neurology

## 2021-03-01 ENCOUNTER — Other Ambulatory Visit: Payer: Self-pay | Admitting: Adult Health

## 2021-03-01 DIAGNOSIS — I10 Essential (primary) hypertension: Secondary | ICD-10-CM

## 2021-03-14 ENCOUNTER — Other Ambulatory Visit: Payer: Self-pay

## 2021-03-14 ENCOUNTER — Encounter: Payer: Self-pay | Admitting: Neurology

## 2021-03-14 ENCOUNTER — Ambulatory Visit: Payer: Medicare Other | Admitting: Neurology

## 2021-03-14 VITALS — BP 122/72 | HR 52 | Ht <= 58 in | Wt 146.8 lb

## 2021-03-14 DIAGNOSIS — I951 Orthostatic hypotension: Secondary | ICD-10-CM | POA: Diagnosis not present

## 2021-03-14 DIAGNOSIS — R55 Syncope and collapse: Secondary | ICD-10-CM

## 2021-03-14 NOTE — Patient Instructions (Signed)
Good to meet you!  Please discuss blood pressure medication with your doctor and monitor medication intake  2. Increase water hydration to 8 glasses a day  3. Follow-up as needed, call for any changes
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
## 2021-03-23 ENCOUNTER — Telehealth: Payer: Self-pay

## 2021-03-23 ENCOUNTER — Ambulatory Visit: Payer: Medicare Other

## 2021-03-23 NOTE — Telephone Encounter (Signed)
Called pt for [place] with no answer x3.  Patient may reschedule for next available appointment.  L.Trinity Haun,LPN

## 2021-03-30 ENCOUNTER — Ambulatory Visit: Payer: Medicare Other | Admitting: Adult Health

## 2021-03-30 ENCOUNTER — Ambulatory Visit (HOSPITAL_BASED_OUTPATIENT_CLINIC_OR_DEPARTMENT_OTHER): Payer: Medicare Other | Admitting: Cardiology

## 2021-03-30 NOTE — Progress Notes (Deleted)
Subjective:    Patient ID: Rachel Austin, female    DOB: 22-May-1956, 65 y.o.   MRN: [id number]  HPI  65 year old female who  has a past medical history of Acute blood loss anemia (06/01/2018), Anemia, Anemia, iron deficiency (06/01/2018), Cataract (2018), Depression, Hyperlipidemia, Hypertension, and Rheumatoid arthritis ([place]).  She was last seen roughly 6 weeks ago after she presented with complaint of multiple falls over the 2 weeks prior to being seen.  Her most recent fall seem to be a syncopal episode, she did not remember the fall at all.  She fell onto a wooden floor, resulting in trauma to her face, pain in cervical spine, and swelling and pain in her left knee where she had a history of knee replacement.  Thankfully her x-rays came back negative.  He was referred over to neurology for evaluation due to syncopal episode.  At neurology with she was found to be orthostatic 110/60-80 4/60 with slight unsteadiness on standing.  Her neurological exam was otherwise normal.  She was advised to increase fluid intake and follow-up with PCP about medication management.   Review of Systems See HPI   Past Medical History:  Diagnosis Date   Acute blood loss anemia 06/01/2018   Anemia    Anemia, iron deficiency 06/01/2018   Cataract 2018   small, bilateral   Depression    Hyperlipidemia    Hypertension    Rheumatoid arthritis ([place])    on meds    Social History   Socioeconomic History   Marital status: Single    Spouse name: Not on file   Number of children: Not on file   Years of education: Not on file   Highest education level: Not on file  Occupational History   Not on file  Tobacco Use   Smoking status: Never   Smokeless tobacco: Never  Vaping Use   Vaping Use: Never used  Substance and Sexual Activity   Alcohol use: No    Alcohol/week: 0.0 standard drinks   Drug use: No   Sexual activity: Never  Other Topics Concern   Not on file  Social History Narrative   Moved  from [place] to [place] in December   Social Determinants of Health   Financial Resource Strain: Not on file  Food Insecurity: Not on file  Transportation Needs: Not on file  Physical Activity: Not on file  Stress: Not on file  Social Connections: Not on file  Intimate Partner Violence: Not on file    Past Surgical History:  Procedure Laterality Date   FINGER SURGERY  01/29/2017   last 2 fingers left hand   FOOT SURGERY  2016   Bil/ foot reconstruction due to RA   KNEE SURGERY Bilateral    Right knee-2010, Left knee -2006   TOTAL HIP ARTHROPLASTY Right 05/31/2018   Procedure: TOTAL HIP ARTHROPLASTY;  Surgeon: [name], MD;  Location: [facility];  Service: Orthopedics;  Laterality: Right;   VARICOSE VEIN SURGERY  2015   right     Family History  Problem Relation Age of Onset   Heart disease Mother        pacemaker, hypertensin    No Known Allergies  Current Outpatient Medications on File Prior to Visit  Medication Sig Dispense Refill   bisoprolol-hydrochlorothiazide (ZIAC) 5-6.25 MG tablet Take 1 tablet by mouth once daily 90 tablet 0   Calcium Carb-Cholecalciferol (CALCIUM + D3 PO) Take 1 tablet by mouth daily.     citalopram (CELEXA)  20 MG tablet Take 1 tablet (20 mg total) by mouth daily. 90 tablet 1   Ferrous Sulfate Dried (SLOW RELEASE IRON) 45 MG TBCR Take 1 tablet by mouth 2 (two) times daily. (Patient taking differently: Take 1 tablet by mouth daily.) 60 tablet 0   folic acid (FOLVITE) 1 MG tablet Take 1 mg by mouth daily.     methotrexate 2.5 MG tablet Take 25 mg by mouth once a week. Thursdays     Multiple Vitamins-Minerals (CENTRUM SILVER ULTRA WOMENS PO) Take 1 tablet by mouth daily.      RESTASIS 0.05 % ophthalmic emulsion 1 drop 2 (two) times daily.     RINVOQ 15 MG TB24      No current facility-administered medications on file prior to visit.    There were no vitals taken for this visit.      Objective:   Physical Exam        Assessment & Plan:

## 2021-04-05 ENCOUNTER — Encounter: Payer: Self-pay | Admitting: Adult Health

## 2021-04-05 ENCOUNTER — Ambulatory Visit (INDEPENDENT_AMBULATORY_CARE_PROVIDER_SITE_OTHER): Payer: Medicare Other | Admitting: Adult Health

## 2021-04-05 ENCOUNTER — Other Ambulatory Visit: Payer: Self-pay

## 2021-04-05 ENCOUNTER — Ambulatory Visit (INDEPENDENT_AMBULATORY_CARE_PROVIDER_SITE_OTHER): Payer: Medicare Other

## 2021-04-05 VITALS — BP 120/62 | HR 49 | Temp 98.7°F | Ht <= 58 in | Wt 149.0 lb

## 2021-04-05 DIAGNOSIS — M25552 Pain in left hip: Secondary | ICD-10-CM | POA: Diagnosis not present

## 2021-04-05 DIAGNOSIS — Z23 Encounter for immunization: Secondary | ICD-10-CM | POA: Diagnosis not present

## 2021-04-05 DIAGNOSIS — I10 Essential (primary) hypertension: Secondary | ICD-10-CM | POA: Diagnosis not present

## 2021-04-05 MED ORDER — LISINOPRIL 10 MG PO TABS
10.0000 mg | ORAL_TABLET | Freq: Every day | ORAL | 0 refills | Status: DC
Start: 2021-04-05 — End: 2021-07-06

## 2021-04-05 NOTE — Patient Instructions (Addendum)
I am going to have you stop Ziac and start lisinopril. Follow up in 3-4 weeks   I am also going to do an xray of your left hip

## 2021-04-05 NOTE — Progress Notes (Signed)
Subjective:    Patient ID: Rachel Austin, female    DOB: 12/09/1955, 65 y.o.   MRN: [id number]  HPI 65 year old female who  has a past medical history of Acute blood loss anemia (06/01/2018), Anemia, Anemia, iron deficiency (06/01/2018), Cataract (2018), Depression, Hyperlipidemia, Hypertension, and Rheumatoid arthritis ([place]).  She presents to the clinic today with her daughter for multiple issues   Left hip pain - She reports that over the last 4 months she has developed left hip pain. Pain is present every day. Pain is worse with long walks, changing positions, bending at the waist and when elevating her legs. Pain is more located in the groin and radiates to the crest of the hip   Orthostatic hypotension -was recently seen by neurology after syncopal episode.  Her neurological exam was normal.  She was noted to have orthostatic hypotension in the office with a greater than 20 point drop from supine to standing (110/60-80 4/60) with slight unsteadiness on standing.  It was thought her syncopal episode was due to orthostatic hypotension.  She was advised to follow-up with PCP and stay hydrated.  She reports no recurrence of syncopal episodes, feeling lightheaded or dizzy.  BP Readings from Last 3 Encounters:  04/05/21 120/62  03/14/21 122/72  02/17/21 120/70   Pulse Readings from Last 3 Encounters:  04/05/21 (!) 49  03/14/21 (!) 52  02/17/21 (!) 48       Review of Systems See HPI   Past Medical History:  Diagnosis Date   Acute blood loss anemia 06/01/2018   Anemia    Anemia, iron deficiency 06/01/2018   Cataract 2018   small, bilateral   Depression    Hyperlipidemia    Hypertension    Rheumatoid arthritis ([place])    on meds    Social History   Socioeconomic History   Marital status: Single    Spouse name: Not on file   Number of children: Not on file   Years of education: Not on file   Highest education level: Not on file  Occupational History   Not on file   Tobacco Use   Smoking status: Never   Smokeless tobacco: Never  Vaping Use   Vaping Use: Never used  Substance and Sexual Activity   Alcohol use: No    Alcohol/week: 0.0 standard drinks   Drug use: No   Sexual activity: Never  Other Topics Concern   Not on file  Social History Narrative   Moved from [place] to [place] in December   Social Determinants of Health   Financial Resource Strain: Not on file  Food Insecurity: Not on file  Transportation Needs: Not on file  Physical Activity: Not on file  Stress: Not on file  Social Connections: Not on file  Intimate Partner Violence: Not on file    Past Surgical History:  Procedure Laterality Date   FINGER SURGERY  01/29/2017   last 2 fingers left hand   FOOT SURGERY  2016   Bil/ foot reconstruction due to RA   KNEE SURGERY Bilateral    Right knee-2010, Left knee -2006   TOTAL HIP ARTHROPLASTY Right 05/31/2018   Procedure: TOTAL HIP ARTHROPLASTY;  Surgeon: [name], MD;  Location: [facility];  Service: Orthopedics;  Laterality: Right;   VARICOSE VEIN SURGERY  2015   right     Family History  Problem Relation Age of Onset   Heart disease Mother        pacemaker, hypertensin  No Known Allergies  Current Outpatient Medications on File Prior to Visit  Medication Sig Dispense Refill   bisoprolol-hydrochlorothiazide (ZIAC) 5-6.25 MG tablet Take 1 tablet by mouth once daily 90 tablet 0   Calcium Carb-Cholecalciferol (CALCIUM + D3 PO) Take 1 tablet by mouth daily.     citalopram (CELEXA) 20 MG tablet Take 1 tablet (20 mg total) by mouth daily. 90 tablet 1   Ferrous Sulfate Dried (SLOW RELEASE IRON) 45 MG TBCR Take 1 tablet by mouth 2 (two) times daily. (Patient taking differently: Take 1 tablet by mouth daily.) 60 tablet 0   folic acid (FOLVITE) 1 MG tablet Take 1 mg by mouth daily.     methotrexate 2.5 MG tablet Take 25 mg by mouth once a week. Thursdays     Multiple Vitamins-Minerals (CENTRUM SILVER ULTRA WOMENS PO) Take 1  tablet by mouth daily.      RESTASIS 0.05 % ophthalmic emulsion 1 drop 2 (two) times daily.     RINVOQ 15 MG TB24      No current facility-administered medications on file prior to visit.    BP 120/62   Pulse (!) 49   Temp 98.7 F (37.1 C) (Oral)   Ht 4\' 9"  (1.448 m)   Wt 149 lb (67.6 kg)   SpO2 97%   BMI 32.24 kg/m       Objective:   Physical Exam Vitals and nursing note reviewed.  Constitutional:      Appearance: Normal appearance.  Cardiovascular:     Rate and Rhythm: Normal rate and regular rhythm.  Musculoskeletal:        General: Tenderness present. Normal range of motion.     Comments: No discomfort with straight leg lift, knee to chest, or internal rotation. She had significant pain with external rotation     Skin:    General: Skin is warm and dry.     Capillary Refill: Capillary refill takes less than 2 seconds.  Neurological:     General: No focal deficit present.     Mental Status: She is alert and oriented to person, place, and time.  Psychiatric:        Mood and Affect: Mood normal.        Behavior: Behavior normal.        Thought Content: Thought content normal.        Judgment: Judgment normal.      Assessment & Plan:  1. Essential hypertension -We will DC her Ziac due to orthostatic hypotension as well as bradycardia.  We will place her on lisinopril 10 mg.  We will have her follow-up in the office in 3 to 4 weeks. - lisinopril (ZESTRIL) 10 MG tablet; Take 1 tablet (10 mg total) by mouth daily.  Dispense: 90 tablet; Refill: 0  2. Left hip pain -Osteoarthritis versus labral tear.  We will start with x-ray today, likely need for MRI of the left hip in the future. - DG Hip Unilat W OR W/O Pelvis 2-3 Views Left; Future  3. Need for influenza vaccination  - Flu vaccine > 3yo with preservative IM (Fluvirin Influenza Split)   Dorothyann Peng, NP

## 2021-04-08 ENCOUNTER — Other Ambulatory Visit: Payer: Self-pay | Admitting: Adult Health

## 2021-04-08 DIAGNOSIS — M25552 Pain in left hip: Secondary | ICD-10-CM

## 2021-04-30 ENCOUNTER — Ambulatory Visit
Admission: RE | Admit: 2021-04-30 | Discharge: 2021-04-30 | Disposition: A | Discharge status: Home / Self Care | Payer: Medicare Other | Source: Ambulatory Visit | Attending: Adult Health | Admitting: Adult Health

## 2021-04-30 DIAGNOSIS — M25552 Pain in left hip: Secondary | ICD-10-CM

## 2021-05-01 ENCOUNTER — Other Ambulatory Visit: Payer: Self-pay

## 2021-05-01 ENCOUNTER — Ambulatory Visit
Admission: RE | Admit: 2021-05-01 | Discharge: 2021-05-01 | Disposition: A | Discharge status: Home / Self Care | Payer: Medicare Other | Source: Ambulatory Visit | Attending: Adult Health | Admitting: Adult Health

## 2021-05-03 NOTE — Progress Notes (Signed)
Tried to call pt no answer. Will try again shortly  

## 2021-05-05 ENCOUNTER — Encounter: Payer: Self-pay | Admitting: Adult Health

## 2021-05-05 ENCOUNTER — Ambulatory Visit (INDEPENDENT_AMBULATORY_CARE_PROVIDER_SITE_OTHER): Payer: Medicare Other

## 2021-05-05 ENCOUNTER — Ambulatory Visit (INDEPENDENT_AMBULATORY_CARE_PROVIDER_SITE_OTHER): Payer: Medicare Other | Admitting: Adult Health

## 2021-05-05 VITALS — BP 130/68 | HR 58 | Temp 98.1°F | Ht <= 58 in | Wt 143.1 lb

## 2021-05-05 VITALS — BP 130/68 | HR 52 | Temp 98.1°F | Wt 143.1 lb

## 2021-05-05 DIAGNOSIS — M25552 Pain in left hip: Secondary | ICD-10-CM

## 2021-05-05 DIAGNOSIS — Z0000 Encounter for general adult medical examination without abnormal findings: Secondary | ICD-10-CM

## 2021-05-05 DIAGNOSIS — I10 Essential (primary) hypertension: Secondary | ICD-10-CM

## 2021-05-05 NOTE — Patient Instructions (Signed)
Rachel Austin , Thank you for taking time to come for your Medicare Wellness Visit. I appreciate your ongoing commitment to your health goals. Please review the following plan we discussed and let me know if I can assist you in the future.   These are the goals we discussed:  Goals      Exercise 3x per week (30 min per time)        This is a list of the screening recommended for you and due dates:  Health Maintenance  Topic Date Due   Pap Smear  05/05/2021*   COVID-19 Vaccine (3 - Booster for Pfizer series) 05/21/2021*   Zoster (Shingles) Vaccine (1 of 2) 08/03/2021*   Mammogram  05/05/2022*   Colon Cancer Screening  04/04/2022   Tetanus Vaccine  12/29/2024   Flu Shot  Completed   Hepatitis C Screening: USPSTF Recommendation to screen - Ages 18-79 yo.  Completed   Pneumococcal Vaccination  Aged Out   HPV Vaccine  Aged Out   HIV Screening  Discontinued  *Topic was postponed. The date shown is not the original due date.     Advanced directives: No  Conditions/risks identified: None  Next appointment: Follow up in one year for your annual wellness visit   Preventive Care 65 Years and Older, Female Preventive care refers to lifestyle choices and visits with your health care provider that can promote health and wellness. What does preventive care include? A yearly physical exam. This is also called an annual well check. Dental exams once or twice a year. Routine eye exams. Ask your health care provider how often you should have your eyes checked. Personal lifestyle choices, including: Daily care of your teeth and gums. Regular physical activity. Eating a healthy diet. Avoiding tobacco and drug use. Limiting alcohol use. Practicing safe sex. Taking low-dose aspirin every day. Taking vitamin and mineral supplements as recommended by your health care provider. What happens during an annual well check? The services and screenings done by your health care provider during your  annual well check will depend on your age, overall health, lifestyle risk factors, and family history of disease. Counseling  Your health care provider may ask you questions about your: Alcohol use. Tobacco use. Drug use. Emotional well-being. Home and relationship well-being. Sexual activity. Eating habits. History of falls. Memory and ability to understand (cognition). Work and work Statistician. Reproductive health. Screening  You may have the following tests or measurements: Height, weight, and BMI. Blood pressure. Lipid and cholesterol levels. These may be checked every 5 years, or more frequently if you are over 75 years old. Skin check. Lung cancer screening. You may have this screening every year starting at age 60 if you have a 30-pack-year history of smoking and currently smoke or have quit within the past 15 years. Fecal occult blood test (FOBT) of the stool. You may have this test every year starting at age 5. Flexible sigmoidoscopy or colonoscopy. You may have a sigmoidoscopy every 5 years or a colonoscopy every 10 years starting at age 6. Hepatitis C blood test. Hepatitis B blood test. Sexually transmitted disease (STD) testing. Diabetes screening. This is done by checking your blood sugar (glucose) after you have not eaten for a while (fasting). You may have this done every 1-3 years. Bone density scan. This is done to screen for osteoporosis. You may have this done starting at age 68. Mammogram. This may be done every 1-2 years. Talk to your health care provider about how often  you should have regular mammograms. Talk with your health care provider about your test results, treatment options, and if necessary, the need for more tests. Vaccines  Your health care provider may recommend certain vaccines, such as: Influenza vaccine. This is recommended every year. Tetanus, diphtheria, and acellular pertussis (Tdap, Td) vaccine. You may need a Td booster every 10  years. Zoster vaccine. You may need this after age 52. Pneumococcal 13-valent conjugate (PCV13) vaccine. One dose is recommended after age 67. Pneumococcal polysaccharide (PPSV23) vaccine. One dose is recommended after age 26. Talk to your health care provider about which screenings and vaccines you need and how often you need them. This information is not intended to replace advice given to you by your health care provider. Make sure you discuss any questions you have with your health care provider. Document Released: 06/04/2015 Document Revised: 01/26/2016 Document Reviewed: 03/09/2015 Elsevier Interactive Patient Education  2017 [place] Prevention in the Home Falls can cause injuries. They can happen to people of all ages. There are many things you can do to make your home safe and to help prevent falls. What can I do on the outside of my home? Regularly fix the edges of walkways and driveways and fix any cracks. Remove anything that might make you trip as you walk through a door, such as a raised step or threshold. Trim any bushes or trees on the path to your home. Use bright outdoor lighting. Clear any walking paths of anything that might make someone trip, such as rocks or tools. Regularly check to see if handrails are loose or broken. Make sure that both sides of any steps have handrails. Any raised decks and porches should have guardrails on the edges. Have any leaves, snow, or ice cleared regularly. Use sand or salt on walking paths during winter. Clean up any spills in your garage right away. This includes oil or grease spills. What can I do in the bathroom? Use night lights. Install grab bars by the toilet and in the tub and shower. Do not use towel bars as grab bars. Use non-skid mats or decals in the tub or shower. If you need to sit down in the shower, use a plastic, non-slip stool. Keep the floor dry. Clean up any water that spills on the floor as soon as it  happens. Remove soap buildup in the tub or shower regularly. Attach bath mats securely with double-sided non-slip rug tape. Do not have throw rugs and other things on the floor that can make you trip. What can I do in the bedroom? Use night lights. Make sure that you have a light by your bed that is easy to reach. Do not use any sheets or blankets that are too big for your bed. They should not hang down onto the floor. Have a firm chair that has side arms. You can use this for support while you get dressed. Do not have throw rugs and other things on the floor that can make you trip. What can I do in the kitchen? Clean up any spills right away. Avoid walking on wet floors. Keep items that you use a lot in easy-to-reach places. If you need to reach something above you, use a strong step stool that has a grab bar. Keep electrical cords out of the way. Do not use floor polish or wax that makes floors slippery. If you must use wax, use non-skid floor wax. Do not have throw rugs and other things on  the floor that can make you trip. What can I do with my stairs? Do not leave any items on the stairs. Make sure that there are handrails on both sides of the stairs and use them. Fix handrails that are broken or loose. Make sure that handrails are as long as the stairways. Check any carpeting to make sure that it is firmly attached to the stairs. Fix any carpet that is loose or worn. Avoid having throw rugs at the top or bottom of the stairs. If you do have throw rugs, attach them to the floor with carpet tape. Make sure that you have a light switch at the top of the stairs and the bottom of the stairs. If you do not have them, ask someone to add them for you. What else can I do to help prevent falls? Wear shoes that: Do not have high heels. Have rubber bottoms. Are comfortable and fit you well. Are closed at the toe. Do not wear sandals. If you use a stepladder: Make sure that it is fully opened.  Do not climb a closed stepladder. Make sure that both sides of the stepladder are locked into place. Ask someone to hold it for you, if possible. Clearly mark and make sure that you can see: Any grab bars or handrails. First and last steps. Where the edge of each step is. Use tools that help you move around (mobility aids) if they are needed. These include: Canes. Walkers. Scooters. Crutches. Turn on the lights when you go into a dark area. Replace any light bulbs as soon as they burn out. Set up your furniture so you have a clear path. Avoid moving your furniture around. If any of your floors are uneven, fix them. If there are any pets around you, be aware of where they are. Review your medicines with your doctor. Some medicines can make you feel dizzy. This can increase your chance of falling. Ask your doctor what other things that you can do to help prevent falls. This information is not intended to replace advice given to you by your health care provider. Make sure you discuss any questions you have with your health care provider. Document Released: 03/04/2009 Document Revised: 10/14/2015 Document Reviewed: 06/12/2014 Elsevier Interactive Patient Education  2017 [employer].

## 2021-05-05 NOTE — Progress Notes (Signed)
Subjective:    Patient ID: Rachel Austin, female    DOB: March 31, 1956, 65 y.o.   MRN: [id number]  HPI 65 year old female who  has a past medical history of Acute blood loss anemia (06/01/2018), Anemia, Anemia, iron deficiency (06/01/2018), Cataract (2018), Depression, Hyperlipidemia, Hypertension, and Rheumatoid arthritis ([place]).  She presents to the office today for follow-up regarding hypertension.  During her last visit Ziac was decreased due to the concern of orthostatic hypotension.  She was placed on lisinopril 10 mg for blood pressure control.  Since starting lisinopril she denies dizziness, lightheadedness, chest pain, shortness of breath, or dry cough. BP Readings from Last 3 Encounters:  05/05/21 130/68  05/05/21 130/68  04/05/21 120/62    Additionally, she had an RI of the left hip due to complaint 4 months of left hip pain.  MRI showed  IMPRESSION: Severe left hip osteoarthritis with probable small subchondral fracture of the left femoral head. Degenerated and torn superior labrum.  She would like to be referred back to [facility] for evaluation      Review of Systems Past Medical History:  Diagnosis Date   Acute blood loss anemia 06/01/2018   Anemia    Anemia, iron deficiency 06/01/2018   Cataract 2018   small, bilateral   Depression    Hyperlipidemia    Hypertension    Rheumatoid arthritis ([place])    on meds    Social History   Socioeconomic History   Marital status: Single    Spouse name: Not on file   Number of children: Not on file   Years of education: Not on file   Highest education level: Not on file  Occupational History   Not on file  Tobacco Use   Smoking status: Never   Smokeless tobacco: Never  Vaping Use   Vaping Use: Never used  Substance and Sexual Activity   Alcohol use: No    Alcohol/week: 0.0 standard drinks   Drug use: No   Sexual activity: Never  Other Topics Concern   Not on file  Social History Narrative   Moved from [place]  to [place] in December   Social Determinants of Health   Financial Resource Strain: Low Risk    Difficulty of Paying Living Expenses: Not hard at all  Food Insecurity: No Food Insecurity   Worried About Charity fundraiser in the Last Year: Never true   Arboriculturist in the Last Year: Never true  Transportation Needs: No Transportation Needs   Lack of Transportation (Medical): No   Lack of Transportation (Non-Medical): No  Physical Activity: Inactive   Days of Exercise per Week: 0 days   Minutes of Exercise per Session: 0 min  Stress: No Stress Concern Present   Feeling of Stress : Only a little  Social Connections: Socially Isolated   Frequency of Communication with Friends and Family: More than three times a week   Frequency of Social Gatherings with Friends and Family: Never   Attends Religious Services: Never   Marine scientist or Organizations: No   Attends Music therapist: Never   Marital Status: Never married  Human resources officer Violence: Not At Risk   Fear of Current or Ex-Partner: No   Emotionally Abused: No   Physically Abused: No   Sexually Abused: No    Past Surgical History:  Procedure Laterality Date   FINGER SURGERY  01/29/2017   last 2 fingers left hand   FOOT SURGERY  2016   Bil/ foot reconstruction due to RA   KNEE SURGERY Bilateral    Right knee-2010, Left knee -2006   TOTAL HIP ARTHROPLASTY Right 05/31/2018   Procedure: TOTAL HIP ARTHROPLASTY;  Surgeon: [name], MD;  Location: [facility];  Service: Orthopedics;  Laterality: Right;   VARICOSE VEIN SURGERY  2015   right     Family History  Problem Relation Age of Onset   Heart disease Mother        pacemaker, hypertensin    No Known Allergies  Current Outpatient Medications on File Prior to Visit  Medication Sig Dispense Refill   bisoprolol-hydrochlorothiazide (ZIAC) 5-6.25 MG tablet Take 1 tablet by mouth once daily 90 tablet 0   Calcium Carb-Cholecalciferol (CALCIUM + D3  PO) Take 1 tablet by mouth daily.     citalopram (CELEXA) 20 MG tablet Take 1 tablet (20 mg total) by mouth daily. 90 tablet 1   Ferrous Sulfate Dried (SLOW RELEASE IRON) 45 MG TBCR Take 1 tablet by mouth 2 (two) times daily. (Patient taking differently: Take 1 tablet by mouth daily.) 60 tablet 0   folic acid (FOLVITE) 1 MG tablet Take 1 mg by mouth daily.     lisinopril (ZESTRIL) 10 MG tablet Take 1 tablet (10 mg total) by mouth daily. 90 tablet 0   methotrexate 2.5 MG tablet Take 25 mg by mouth once a week. Thursdays     Multiple Vitamins-Minerals (CENTRUM SILVER ULTRA WOMENS PO) Take 1 tablet by mouth daily.      RESTASIS 0.05 % ophthalmic emulsion 1 drop 2 (two) times daily.     RINVOQ 15 MG TB24      No current facility-administered medications on file prior to visit.    BP 130/68    Pulse (!) 52    Temp 98.1 F (36.7 C)    Wt 143 lb 1.6 oz (64.9 kg)    SpO2 98%    BMI 30.97 kg/m       Objective:   Physical Exam Vitals and nursing note reviewed.  Constitutional:      Appearance: Normal appearance.  Cardiovascular:     Rate and Rhythm: Normal rate and regular rhythm.     Pulses: Normal pulses.     Heart sounds: Normal heart sounds.  Pulmonary:     Effort: Pulmonary effort is normal.     Breath sounds: Normal breath sounds.  Neurological:     General: No focal deficit present.     Mental Status: She is alert and oriented to person, place, and time.     Gait: Gait abnormal (walks with limp).  Psychiatric:        Mood and Affect: Mood normal.        Behavior: Behavior normal.        Thought Content: Thought content normal.        Judgment: Judgment normal.      Assessment & Plan:  1. Left hip pain - Refused pain medication - AMB referral to orthopedics  2. Essential hypertension - Controlled. No change in medications    Dorothyann Peng, NP

## 2021-05-05 NOTE — Progress Notes (Signed)
Subjective:   Ronae Noell is a 65 y.o. female who presents for Medicare Annual (Subsequent) preventive examination.  Review of Systems    No ROS Cardiac Risk Factors include: advanced age (>19men, >5 women);hypertension    Objective:    Today's Vitals   05/05/21 1510  BP: 130/68  Pulse: (!) 58  Temp: 98.1 F (36.7 C)  Weight: 143 lb 1.6 oz (64.9 kg)  Height: 4\' 9"  (1.448 m)   Body mass index is 30.97 kg/m.  Advanced Directives 05/05/2021 03/14/2021 03/22/2020 11/11/2019 05/31/2018 05/24/2018 04/04/2017  Does Patient Have a Medical Advance Directive? No No No No No No No  Would patient like information on creating a medical advance directive? No - Patient declined - No - Patient declined No - Patient declined Yes (MAU/Ambulatory/Procedural Areas - Information given) Yes (MAU/Ambulatory/Procedural Areas - Information given) -    Current Medications (verified) Outpatient Encounter Medications as of 05/05/2021  Medication Sig   bisoprolol-hydrochlorothiazide (ZIAC) 5-6.25 MG tablet Take 1 tablet by mouth once daily   Calcium Carb-Cholecalciferol (CALCIUM + D3 PO) Take 1 tablet by mouth daily.   citalopram (CELEXA) 20 MG tablet Take 1 tablet (20 mg total) by mouth daily.   Ferrous Sulfate Dried (SLOW RELEASE IRON) 45 MG TBCR Take 1 tablet by mouth 2 (two) times daily. (Patient taking differently: Take 1 tablet by mouth daily.)   folic acid (FOLVITE) 1 MG tablet Take 1 mg by mouth daily.   lisinopril (ZESTRIL) 10 MG tablet Take 1 tablet (10 mg total) by mouth daily.   methotrexate 2.5 MG tablet Take 25 mg by mouth once a week. Thursdays   Multiple Vitamins-Minerals (CENTRUM SILVER ULTRA WOMENS PO) Take 1 tablet by mouth daily.    RESTASIS 0.05 % ophthalmic emulsion 1 drop 2 (two) times daily.   RINVOQ 15 MG TB24    No facility-administered encounter medications on file as of 05/05/2021.    Allergies (verified) Patient has no known allergies.   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/2018   Procedure: TOTAL HIP ARTHROPLASTY;  Surgeon: [name], MD;  Location: [facility];  Service: Orthopedics;  Laterality: Right;   VARICOSE VEIN SURGERY  2015   right    Family History  Problem Relation Age of Onset   Heart disease Mother        pacemaker, hypertensin   Social History   Socioeconomic History   Marital status: Single    Spouse name: Not on file   Number of children: Not on file   Years of education: Not on file   Highest education level: Not on file  Occupational History   Not on file  Tobacco Use   Smoking status: Never   Smokeless tobacco: Never  Vaping Use   Vaping Use: Never used  Substance and Sexual Activity   Alcohol use: No    Alcohol/week: 0.0 standard drinks  Drug use: No   Sexual activity: Never  Other Topics Concern   Not on file  Social History Narrative   Moved from [place] to [place] in December   Social Determinants of Health   Financial Resource Strain: Low Risk    Difficulty of Paying Living Expenses: Not hard at all  Food Insecurity: No Food Insecurity   Worried About Charity fundraiser in the Last Year: Never true   Arboriculturist in the Last Year: Never true  Transportation Needs: No Transportation Needs   Lack of Transportation (Medical): No   Lack of Transportation (Non-Medical): No  Physical Activity: Inactive   Days of Exercise per Week: 0 days   Minutes of Exercise per Session: 0 min  Stress: No Stress Concern Present    Feeling of Stress : Only a little  Social Connections: Socially Isolated   Frequency of Communication with Friends and Family: More than three times a week   Frequency of Social Gatherings with Friends and Family: Never   Attends Religious Services: Never   Marine scientist or Organizations: No   Attends Archivist Meetings: Never   Marital Status: Never married    Clinical Intake: How often do you need to have someone help you when you read instructions, pamphlets, or other written materials from your doctor or pharmacy?: 1 - Never  Diabetic? No  Interpreter Needed?: No Activities of Daily Living In your present state of health, do you have any difficulty performing the following activities: 05/05/2021  Hearing? N  Vision? Y  Comment Wears glasses. Followed by Dr Katy Fitch  Difficulty concentrating or making decisions? N  Walking or climbing stairs? N  Dressing or bathing? N  Doing errands, shopping? N  Preparing Food and eating ? N  Using the Toilet? N  In the past six months, have you accidently leaked urine? N  Do you have problems with loss of bowel control? N  Managing your Medications? N  Managing your Finances? N  Housekeeping or managing your Housekeeping? N  Some recent data might be hidden    Patient Care Team: Dorothyann Peng, NP as PCP - General (Family Medicine) Latanya Maudlin, MD as Consulting Physician (Orthopedic Surgery) [name], MD as Consulting Physician (Orthopedic Surgery) Ob/Gyn, Hillis Range, Lezlie Octave, MD as Consulting Physician (Neurology)  Indicate any recent Medical Services you may have received from other than [facility] providers in the past year (date may be approximate).     Assessment:   This is a routine wellness examination for Annica.  Hearing/Vision screen Hearing Screening - Comments:: No difficulty hearing Vision Screening - Comments:: Wears glasses. Followed by Dr Katy Fitch  Dietary issues and exercise  activities discussed: Current Exercise Habits: The patient does not participate in regular exercise at present   Goals Addressed             This Visit's Progress    Exercise 3x per week (30 min per time)         Depression Screen PHQ 2/9 Scores 05/05/2021 08/26/2020 03/22/2020 06/06/2019 11/08/2016 12/30/2014 07/16/2014  PHQ - 2 Score 0 0 0 2 0 4 6  PHQ- 9 Score - 0 0 3 - 10 13    Fall Risk Fall Risk  05/05/2021 03/14/2021 08/26/2020 03/22/2020 06/06/2019  Falls in the past year? 1 1 0 0 0  Number falls in past yr: 1 1 0 0 -  Injury with Fall? 0 0 - 0 -  Comment  Patient pending MRI results. Followed by PCP - - - -  Risk for fall due to : - - - No Fall Risks -  Follow up - - - Falls evaluation completed;Falls prevention discussed -    FALL RISK PREVENTION PERTAINING TO THE HOME:  Any stairs in or around the home? Yes  If so, are there any without handrails? No  Home free of loose throw rugs in walkways, pet beds, electrical cords, etc? Yes  Adequate lighting in your home to reduce risk of falls? Yes   ASSISTIVE DEVICES UTILIZED TO PREVENT FALLS:  Life alert? No  Use of a cane, walker or w/c? No  Grab bars in the bathroom? No  Shower chair or bench in shower? No  Elevated toilet seat or a handicapped toilet? No   TIMED UP AND GO:  Was the test performed? Yes .  Length of time to ambulate 10 feet: 5 sec.   Gait steady and fast without use of assistive device  Cognitive Function:     6CIT Screen 05/05/2021  What Year? 0 points  What month? 0 points  What time? 0 points  Count back from 20 0 points  Months in reverse 0 points  Repeat phrase 0 points  Total Score 0    Immunizations Immunization History  Administered Date(s) Administered   Influenza,inj,Quad PF,6+ Mos 03/01/2015, 01/31/2018, 06/06/2019, 04/05/2021   PFIZER(Purple Top)SARS-COV-2 Vaccination 02/19/2020, 03/17/2020   Tdap 12/30/2014    Covid-19 vaccine status: Declined, Education has been provided  regarding the importance of this vaccine but patient still declined. Advised may receive this vaccine at local pharmacy or Health Dept.or vaccine clinic. Aware to provide a copy of the vaccination record if obtained from local pharmacy or Health Dept. Verbalized acceptance and understanding.  Qualifies for Shingles Vaccine? Yes   Zostavax completed No   Shingrix Completed?: No.    Education has been provided regarding the importance of this vaccine. Patient has been advised to call insurance company to determine out of pocket expense if they have not yet received this vaccine. Advised may also receive vaccine at local pharmacy or Health Dept. Verbalized acceptance and understanding.  Screening Tests Health Maintenance  Topic Date Due   PAP SMEAR-Modifier  05/05/2021 (Originally 09/12/1976)   COVID-19 Vaccine (3 - Booster for [place] series) 05/21/2021 (Originally 05/12/2020)   Zoster Vaccines- Shingrix (1 of 2) 08/03/2021 (Originally 09/13/2006)   MAMMOGRAM  05/05/2022 (Originally 04/02/2016)   COLONOSCOPY (Pts 45-31yrs Insurance coverage will need to be confirmed)  04/04/2022   TETANUS/TDAP  12/29/2024   INFLUENZA VACCINE  Completed   Hepatitis C Screening  Completed   Pneumococcal Vaccine 48-7 Years old  Aged Out   HPV VACCINES  Aged Out   HIV Screening  Discontinued    Health Maintenance  There are no preventive care reminders to display for this patient.  Mammogram: Patient deferred  Additional Screening:   Vision Screening: Recommended annual ophthalmology exams for early detection of glaucoma and other disorders of the eye. Is the patient up to date with their annual eye exam?  Yes  Who is the provider or what is the name of the office in which the patient attends annual eye exams? Followed by Dr Katy Fitch  Dental Screening: Recommended annual dental exams for proper oral hygiene  Community Resource Referral / Chronic Care Management:   CRR required this visit?  No   CCM  required this visit?  No      Plan:  I have personally reviewed and noted the following in the patients chart:   Medical and social history Use of alcohol, tobacco or illicit drugs  Current medications and supplements including opioid prescriptions. Patient currently not taking opioids. Functional ability and status Nutritional status Physical activity Advanced directives List of other physicians Hospitalizations, surgeries, and ER visits in previous 12 months Vitals Screenings to include cognitive, depression, and falls Referrals and appointments  In addition, I have reviewed and discussed with patient certain preventive protocols, quality metrics, and best practice recommendations. A written personalized care plan for preventive services as well as general preventive health recommendations were provided to patient.     Criselda Peaches, LPN   [id number]

## 2021-05-18 ENCOUNTER — Encounter: Payer: Self-pay | Admitting: Interventional Cardiology

## 2021-05-18 ENCOUNTER — Other Ambulatory Visit: Payer: Self-pay

## 2021-05-18 ENCOUNTER — Ambulatory Visit: Payer: Medicare Other | Admitting: Interventional Cardiology

## 2021-05-18 VITALS — BP 130/76 | HR 59 | Ht <= 58 in | Wt 149.0 lb

## 2021-05-18 DIAGNOSIS — I10 Essential (primary) hypertension: Secondary | ICD-10-CM | POA: Diagnosis not present

## 2021-05-18 DIAGNOSIS — R55 Syncope and collapse: Secondary | ICD-10-CM | POA: Diagnosis not present

## 2021-05-18 DIAGNOSIS — D649 Anemia, unspecified: Secondary | ICD-10-CM

## 2021-05-18 NOTE — Patient Instructions (Signed)
Medication Instructions:  Your physician recommends that you continue on your current medications as directed. Please refer to the Current Medication list given to you today.  *If you need a refill on your cardiac medications before your next appointment, please call your pharmacy*   Lab Work: None If you have labs (blood work) drawn today and your tests are completely normal, you will receive your results only by: [place] (if you have MyChart) OR A paper copy in the mail If you have any lab test that is abnormal or we need to change your treatment, we will call you to review the results.   Testing/Procedures: None   Follow-Up: At [facility], you and your health needs are our priority.  As part of our continuing mission to provide you with exceptional heart care, we have created designated Provider Care Teams.  These Care Teams include your primary Cardiologist (physician) and Advanced Practice Providers (APPs -  Physician Assistants and Nurse Practitioners) who all work together to provide you with the care you need, when you need it.  We recommend signing up for the patient portal called "MyChart".  Sign up information is provided on this After Visit Summary.  MyChart is used to connect with patients for Virtual Visits (Telemedicine).  Patients are able to view lab/test results, encounter notes, upcoming appointments, etc.  Non-urgent messages can be sent to your provider as well.   To learn more about what you can do with MyChart, go to [email or web address].    Your next appointment:   1 year(s)  The format for your next appointment:   In Person  Provider:   Larae Grooms, MD    Other Instructions

## 2021-05-18 NOTE — Progress Notes (Signed)
Cardiology Office Note   Date:  05/18/2021   ID:  Rachel, Austin 08-04-1955, MRN [id number]  PCP:  Dorothyann Peng, NP    No chief complaint on file.  Syncope  Wt Readings from Last 3 Encounters:  05/18/21 149 lb (67.6 kg)  05/05/21 143 lb 1.6 oz (64.9 kg)  05/05/21 143 lb 1.6 oz (64.9 kg)       History of Present Illness: Rachel Austin is a 65 y.o. female who is being seen today for the evaluation of syncope at the request of Dorothyann Peng, NP.   She had dizziness without a fall in 01/2021.  Two days later, she was found on the floor.  She was walking with a mop.  She fell on the mop and then had severe bruising.  She blacked out completely.   Records from Neuro show: "She had some falls prior to a major fall in September 2022 where she may have briefly lost consciousness, her daughter is concerned about neurological causes of her events. Her neurological exam is normal. She is noted to have orthostatic hypotension with >20-point drop from supine to standing (110/60 to 84/60) with slight unsteadiness on standing, recent events likely due to orthostasis. We discussed increasing hydration and discussing BP changes with PCP."  BP meds were changed. Per PMD: "We will DC her Ziac due to orthostatic hypotension as well as bradycardia.  We will place her on lisinopril 10 mg.  We will have her follow-up in the office in 3 to 4 weeks. - lisinopril (ZESTRIL) 10 MG tablet; Take 1 tablet (10 mg total) by mouth daily. "  Since medication change, denies : Chest pain. Dizziness. Leg edema. Nitroglycerin use. Orthopnea. Palpitations. Paroxysmal nocturnal dyspnea. Shortness of breath. Syncope.      Past Medical History:  Diagnosis Date   Acute blood loss anemia 06/01/2018   Anemia    Anemia, iron deficiency 06/01/2018   Cataract 2018   small, bilateral   Depression    Hyperlipidemia    Hypertension    Rheumatoid arthritis ([place])    on meds    Past Surgical History:  Procedure  Laterality Date   FINGER SURGERY  01/29/2017   last 2 fingers left hand   FOOT SURGERY  2016   Bil/ foot reconstruction due to RA   KNEE SURGERY Bilateral    Right knee-2010, Left knee -2006   TOTAL HIP ARTHROPLASTY Right 05/31/2018   Procedure: TOTAL HIP ARTHROPLASTY;  Surgeon: [name], MD;  Location: [facility];  Service: Orthopedics;  Laterality: Right;   VARICOSE VEIN SURGERY  2015   right      Current Outpatient Medications  Medication Sig Dispense Refill   Calcium Carb-Cholecalciferol (CALCIUM + D3 PO) Take 1 tablet by mouth daily.     citalopram (CELEXA) 20 MG tablet Take 1 tablet (20 mg total) by mouth daily. 90 tablet 1   Ferrous Sulfate Dried (SLOW RELEASE IRON) 45 MG TBCR Take 1 tablet by mouth 2 (two) times daily. 60 tablet 0   folic acid (FOLVITE) 1 MG tablet Take 1 mg by mouth daily.     lisinopril (ZESTRIL) 10 MG tablet Take 1 tablet (10 mg total) by mouth daily. 90 tablet 0   methotrexate 2.5 MG tablet Take 25 mg by mouth once a week. Thursdays     Multiple Vitamins-Minerals (CENTRUM SILVER ULTRA WOMENS PO) Take 1 tablet by mouth daily.      RESTASIS 0.05 % ophthalmic emulsion 1 drop 2 (two)  times daily.     RINVOQ 15 MG TB24      No current facility-administered medications for this visit.    Allergies:   Patient has no known allergies.    Social History:  The patient  reports that she has never smoked. She has never used smokeless tobacco. She reports that she does not drink alcohol and does not use drugs.   Family History:  The patient's family history includes Heart disease in her mother. Mother needed pacer at 10.    ROS:  Please see the history of present illness.   Otherwise, review of systems are positive for syncope, hip pain.   All other systems are reviewed and negative.    PHYSICAL EXAM: VS:  BP 130/76 (BP Location: Left Arm, Patient Position: Sitting, Cuff Size: Normal)    Pulse (!) 59    Ht 4\' 9"  (1.448 m)    Wt 149 lb (67.6 kg)    SpO2 98%     BMI 32.24 kg/m  , BMI Body mass index is 32.24 kg/m. GEN: Well nourished, well developed, in no acute distress HEENT: normal Neck: no JVD, carotid bruits, or masses Cardiac: RRR; no murmurs, rubs, or gallops,no edema  Respiratory:  clear to auscultation bilaterally, normal work of breathing GI: soft, nontender, nondistended, + BS MS: no deformity or atrophy Skin: warm and dry, no rash Neuro:  Strength and sensation are intact, slow gait due to hip pain Psych: euthymic mood, full affect   EKG:   The ekg ordered today demonstrates sinus bradycardia, nonspecific ST-T wave changes   Recent Labs: 08/26/2020: TSH 1.63 02/21/2021: ALT 21; BUN 23; Creatinine, Ser 0.68; Hemoglobin 11.3; Platelets 314.0; Potassium 3.9; Sodium 138   Lipid Panel    Component Value Date/Time   CHOL 207 (H) 08/26/2020 1105   TRIG 112.0 08/26/2020 1105   HDL 54.90 08/26/2020 1105   CHOLHDL 4 08/26/2020 1105   VLDL 22.4 08/26/2020 1105   LDLCALC 130 (H) 08/26/2020 1105     Other studies Reviewed: Additional studies/ records that were reviewed today with results demonstrating: records reviewed, LDL 130 in 08/2020, HDL 54.   ASSESSMENT AND PLAN:  Syncope: No palpitations. No episodes since BP meds were changed.  Stay well hydrated.  We discussed checking monitor.  Since she has not had any symptoms in 3 months, I think we can hold off.  She did have bradycardia with the Ziac.  Now, heart rate is around 60 on her ECG with narrow QRS.  Her mother had a pacemaker placed at age 63.  If she has any symptoms of presyncope, would have a low threshold for 2 weeks Zio patch and echocardiogram. HTN: Had orthostasis in the past.  Improved recently with medication change.  I stressed the importance of staying well-hydrated.  She needs to drink water along with her 2 cups of coffee a day. Mild anemia: Hbg 11.3 in 02/2021.  Defer to primary care physician.  If this worsen, this could also contribute to lightheadedness and  syncope.  We spoke about changing positions slowly, particularly going from lying to standing.  She also needs to sit down immediately if she has any warnings or symptoms that she may pass out. Daughter was present for the visit.    Current medicines are reviewed at length with the patient today.  The patient concerns regarding her medicines were addressed.  The following changes have been made:  No change  Labs/ tests ordered today include:  No orders of  the defined types were placed in this encounter.   Recommend 150 minutes/week of aerobic exercise Low fat, low carb, high fiber diet recommended  Disposition:   FU in 1 year   Signed, Larae Grooms, MD  05/18/2021 11:15 AM    [address], [place], [place]  [address] Phone: [phone]; Fax: [phone]

## 2021-05-23 DIAGNOSIS — H16223 Keratoconjunctivitis sicca, not specified as Sjogren's, bilateral: Secondary | ICD-10-CM | POA: Diagnosis not present

## 2021-05-23 DIAGNOSIS — H0102A Squamous blepharitis right eye, upper and lower eyelids: Secondary | ICD-10-CM | POA: Diagnosis not present

## 2021-05-23 DIAGNOSIS — H40013 Open angle with borderline findings, low risk, bilateral: Secondary | ICD-10-CM | POA: Diagnosis not present

## 2021-05-23 DIAGNOSIS — H0102B Squamous blepharitis left eye, upper and lower eyelids: Secondary | ICD-10-CM | POA: Diagnosis not present

## 2021-05-31 DIAGNOSIS — M25552 Pain in left hip: Secondary | ICD-10-CM | POA: Diagnosis not present

## 2021-05-31 DIAGNOSIS — M0579 Rheumatoid arthritis with rheumatoid factor of multiple sites without organ or systems involvement: Secondary | ICD-10-CM | POA: Diagnosis not present

## 2021-05-31 DIAGNOSIS — M0580 Other rheumatoid arthritis with rheumatoid factor of unspecified site: Secondary | ICD-10-CM | POA: Diagnosis not present

## 2021-05-31 DIAGNOSIS — M79643 Pain in unspecified hand: Secondary | ICD-10-CM | POA: Diagnosis not present

## 2021-05-31 DIAGNOSIS — H16221 Keratoconjunctivitis sicca, not specified as Sjogren's, right eye: Secondary | ICD-10-CM | POA: Diagnosis not present

## 2021-05-31 DIAGNOSIS — Z79899 Other long term (current) drug therapy: Secondary | ICD-10-CM | POA: Diagnosis not present

## 2021-05-31 DIAGNOSIS — M1990 Unspecified osteoarthritis, unspecified site: Secondary | ICD-10-CM | POA: Diagnosis not present

## 2021-06-22 DIAGNOSIS — H16223 Keratoconjunctivitis sicca, not specified as Sjogren's, bilateral: Secondary | ICD-10-CM | POA: Diagnosis not present

## 2021-07-06 ENCOUNTER — Other Ambulatory Visit: Payer: Self-pay

## 2021-07-06 ENCOUNTER — Telehealth: Payer: Self-pay | Admitting: Adult Health

## 2021-07-06 DIAGNOSIS — I10 Essential (primary) hypertension: Secondary | ICD-10-CM

## 2021-07-06 DIAGNOSIS — F329 Major depressive disorder, single episode, unspecified: Secondary | ICD-10-CM

## 2021-07-06 MED ORDER — CITALOPRAM HYDROBROMIDE 20 MG PO TABS: 20.0000 mg | ORAL_TABLET | Freq: Every day | ORAL | 1 refills | Status: DC

## 2021-07-06 MED ORDER — LISINOPRIL 10 MG PO TABS: 10.0000 mg | ORAL_TABLET | Freq: Every day | ORAL | 0 refills | Status: DC

## 2021-07-06 NOTE — Telephone Encounter (Signed)
Rx refilled electronically °

## 2021-07-06 NOTE — Telephone Encounter (Signed)
Pt daughter call and stated pt need a citalopram (CELEXA) 20 MG tablet  and lisinopril (ZESTRIL) 10 MG tablet  sent to  [facility] [place], [place] AT [address] Phone:  [phone]  Fax:  [phone]

## 2021-07-18 DIAGNOSIS — H2513 Age-related nuclear cataract, bilateral: Secondary | ICD-10-CM | POA: Diagnosis not present

## 2021-07-18 DIAGNOSIS — H17823 Peripheral opacity of cornea, bilateral: Secondary | ICD-10-CM | POA: Diagnosis not present

## 2021-07-18 DIAGNOSIS — H0102B Squamous blepharitis left eye, upper and lower eyelids: Secondary | ICD-10-CM | POA: Diagnosis not present

## 2021-07-18 DIAGNOSIS — H16223 Keratoconjunctivitis sicca, not specified as Sjogren's, bilateral: Secondary | ICD-10-CM | POA: Diagnosis not present

## 2021-07-18 DIAGNOSIS — H0102A Squamous blepharitis right eye, upper and lower eyelids: Secondary | ICD-10-CM | POA: Diagnosis not present

## 2021-10-05 ENCOUNTER — Other Ambulatory Visit: Payer: Self-pay | Admitting: Adult Health

## 2021-10-05 DIAGNOSIS — I10 Essential (primary) hypertension: Secondary | ICD-10-CM

## 2021-10-21 ENCOUNTER — Ambulatory Visit: Payer: Medicare Other | Admitting: Adult Health

## 2021-10-21 NOTE — Progress Notes (Deleted)
Subjective:    Patient ID: Rachel Austin, female    DOB: Sep 24, 1955, 66 y.o.   MRN: [id number]  HPI 66 year old female who  has a past medical history of Acute blood loss anemia (06/01/2018), Anemia, Anemia, iron deficiency (06/01/2018), Cataract (2018), Depression, Hyperlipidemia, Hypertension, and Rheumatoid arthritis ([place]).  She presents to the office today with her daughter for pre operative clearance.   She will be having a left total hip replacement done at [facility]   MRI of hip showed Severe left hip osteoarthritis with probable small subchondral fracture of the left femoral head. Degenerated and torn superior labrum.   Review of Systems See HPI   Past Medical History:  Diagnosis Date   Acute blood loss anemia 06/01/2018   Anemia    Anemia, iron deficiency 06/01/2018   Cataract 2018   small, bilateral   Depression    Hyperlipidemia    Hypertension    Rheumatoid arthritis ([place])    on meds    Social History   Socioeconomic History   Marital status: Single    Spouse name: Not on file   Number of children: Not on file   Years of education: Not on file   Highest education level: Not on file  Occupational History   Not on file  Tobacco Use   Smoking status: Never   Smokeless tobacco: Never  Vaping Use   Vaping Use: Never used  Substance and Sexual Activity   Alcohol use: No    Alcohol/week: 0.0 standard drinks   Drug use: No   Sexual activity: Never  Other Topics Concern   Not on file  Social History Narrative   Moved from [place] to [place] in December   Social Determinants of Health   Financial Resource Strain: Low Risk    Difficulty of Paying Living Expenses: Not hard at all  Food Insecurity: No Food Insecurity   Worried About Charity fundraiser in the Last Year: Never true   Arboriculturist in the Last Year: Never true  Transportation Needs: No Transportation Needs   Lack of Transportation (Medical): No   Lack of Transportation (Non-Medical): No   Physical Activity: Inactive   Days of Exercise per Week: 0 days   Minutes of Exercise per Session: 0 min  Stress: No Stress Concern Present   Feeling of Stress : Only a little  Social Connections: Socially Isolated   Frequency of Communication with Friends and Family: More than three times a week   Frequency of Social Gatherings with Friends and Family: Never   Attends Religious Services: Never   Marine scientist or Organizations: No   Attends Music therapist: Never   Marital Status: Never married  Human resources officer Violence: Not At Risk   Fear of Current or Ex-Partner: No   Emotionally Abused: No   Physically Abused: No   Sexually Abused: No    Past Surgical History:  Procedure Laterality Date   FINGER SURGERY  01/29/2017   last 2 fingers left hand   FOOT SURGERY  2016   Bil/ foot reconstruction due to RA   KNEE SURGERY Bilateral    Right knee-2010, Left knee -2006   TOTAL HIP ARTHROPLASTY Right 05/31/2018   Procedure: TOTAL HIP ARTHROPLASTY;  Surgeon: [name], MD;  Location: [facility];  Service: Orthopedics;  Laterality: Right;   VARICOSE VEIN SURGERY  2015   right     Family History  Problem Relation Age of Onset  Heart disease Mother        pacemaker, hypertensin    No Known Allergies  Current Outpatient Medications on File Prior to Visit  Medication Sig Dispense Refill   Calcium Carb-Cholecalciferol (CALCIUM + D3 PO) Take 1 tablet by mouth daily.     citalopram (CELEXA) 20 MG tablet Take 1 tablet (20 mg total) by mouth daily. 90 tablet 1   Ferrous Sulfate Dried (SLOW RELEASE IRON) 45 MG TBCR Take 1 tablet by mouth 2 (two) times daily. 60 tablet 0   folic acid (FOLVITE) 1 MG tablet Take 1 mg by mouth daily.     lisinopril (ZESTRIL) 10 MG tablet TAKE 1 TABLET(10 MG) BY MOUTH DAILY 90 tablet 0   methotrexate 2.5 MG tablet Take 25 mg by mouth once a week. Thursdays     Multiple Vitamins-Minerals (CENTRUM SILVER ULTRA WOMENS PO) Take 1 tablet  by mouth daily.      RESTASIS 0.05 % ophthalmic emulsion 1 drop 2 (two) times daily.     RINVOQ 15 MG TB24      No current facility-administered medications on file prior to visit.    There were no vitals taken for this visit.      Objective:   Physical Exam        Assessment & Plan:

## 2021-11-24 DIAGNOSIS — M0579 Rheumatoid arthritis with rheumatoid factor of multiple sites without organ or systems involvement: Secondary | ICD-10-CM | POA: Diagnosis not present

## 2021-11-24 DIAGNOSIS — H40013 Open angle with borderline findings, low risk, bilateral: Secondary | ICD-10-CM | POA: Diagnosis not present

## 2021-11-24 DIAGNOSIS — H0102A Squamous blepharitis right eye, upper and lower eyelids: Secondary | ICD-10-CM | POA: Diagnosis not present

## 2021-11-24 DIAGNOSIS — H0102B Squamous blepharitis left eye, upper and lower eyelids: Secondary | ICD-10-CM | POA: Diagnosis not present

## 2021-11-24 DIAGNOSIS — H2513 Age-related nuclear cataract, bilateral: Secondary | ICD-10-CM | POA: Diagnosis not present

## 2021-11-24 DIAGNOSIS — H17823 Peripheral opacity of cornea, bilateral: Secondary | ICD-10-CM | POA: Diagnosis not present

## 2021-12-26 ENCOUNTER — Telehealth: Payer: Self-pay | Admitting: Interventional Cardiology

## 2021-12-26 DIAGNOSIS — M1612 Unilateral primary osteoarthritis, left hip: Secondary | ICD-10-CM | POA: Diagnosis not present

## 2021-12-26 NOTE — Telephone Encounter (Signed)
   Pre-operative Risk Assessment    Patient Name: Rachel Austin  DOB: August 06, 1955 MRN: [id number]      Request for Surgical Clearance    Procedure:   Total Hip Replacement   Date of Surgery:  Clearance TBD     asap                             Surgeon:  Dr. [name] Surgeon's Group or Practice Name:  [facility]  Phone number:  [phone] Fax number:  [phone]   Type of Clearance Requested:   - Medical    Type of Anesthesia:  Spinal   Additional requests/questions:    SignedFoye Clock   12/26/2021, 12:25 PM

## 2021-12-26 NOTE — Telephone Encounter (Signed)
[place], MD   Preoperative team, please contact this patient and set up a phone call appointment for further preoperative risk assessment. Please obtain consent and complete medication review. Thank you for your help.   I confirm that guidance regarding antiplatelet and oral anticoagulation therapy has been completed and, if necessary, noted below (none requested).   Emmaline Life, NP-C    12/26/2021, 4:36 PM [facility] [address] Office [phone] Fax [phone]

## 2021-12-27 ENCOUNTER — Ambulatory Visit (INDEPENDENT_AMBULATORY_CARE_PROVIDER_SITE_OTHER): Payer: Medicare Other | Admitting: Adult Health

## 2021-12-27 ENCOUNTER — Encounter: Payer: Self-pay | Admitting: Adult Health

## 2021-12-27 VITALS — BP 108/60 | HR 98 | Temp 98.6°F | Wt 137.0 lb

## 2021-12-27 DIAGNOSIS — Z01818 Encounter for other preprocedural examination: Secondary | ICD-10-CM

## 2021-12-27 NOTE — Telephone Encounter (Signed)
I left a message for the patient to return my call to schedule a tele visit for clearance.  

## 2021-12-27 NOTE — Telephone Encounter (Signed)
Patient's daughter is returning call. She states she is at work. If unavailable when she calls please leave detailed message with time frame patient needs to be seen in.

## 2021-12-27 NOTE — Telephone Encounter (Signed)
Returned call and left message for patient to call back.

## 2021-12-27 NOTE — Progress Notes (Signed)
Subjective:    Patient ID: Rachel Austin, female    DOB: 03/15/1956, 66 y.o.   MRN: [id number]  HPI 66 year old female who  has a past medical history of Acute blood loss anemia (06/01/2018), Anemia, Anemia, iron deficiency (06/01/2018), Cataract (2018), Depression, Hyperlipidemia, Hypertension, and Rheumatoid arthritis ([place]).  She presents to the office today for pre surgical clearance. She will be having a left total hip replacement due in the hospital setting. This surgery will be done by Dr. French Ana from [facility]   She has a pre surgical clearance appointment with Cardiology and Rheumatology on 01/04/2022.   She is feeling well overall and is ready to have the surgery so she can be out of pain.    Review of Systems See HPI   Past Medical History:  Diagnosis Date   Acute blood loss anemia 06/01/2018   Anemia    Anemia, iron deficiency 06/01/2018   Cataract 2018   small, bilateral   Depression    Hyperlipidemia    Hypertension    Rheumatoid arthritis ([place])    on meds    Social History   Socioeconomic History   Marital status: Single    Spouse name: Not on file   Number of children: Not on file   Years of education: Not on file   Highest education level: Not on file  Occupational History   Not on file  Tobacco Use   Smoking status: Never   Smokeless tobacco: Never  Vaping Use   Vaping Use: Never used  Substance and Sexual Activity   Alcohol use: No    Alcohol/week: 0.0 standard drinks of alcohol   Drug use: No   Sexual activity: Never  Other Topics Concern   Not on file  Social History Narrative   Moved from [place] to [place] in December   Social Determinants of Health   Financial Resource Strain: Low Risk  (05/05/2021)   Overall Financial Resource Strain (CARDIA)    Difficulty of Paying Living Expenses: Not hard at all  Food Insecurity: No Food Insecurity (05/05/2021)   Hunger Vital Sign    Worried About Running Out of Food in the Last Year: Never true     Ran Out of Food in the Last Year: Never true  Transportation Needs: No Transportation Needs (05/05/2021)   PRAPARE - Hydrologist (Medical): No    Lack of Transportation (Non-Medical): No  Physical Activity: Inactive (05/05/2021)   Exercise Vital Sign    Days of Exercise per Week: 0 days    Minutes of Exercise per Session: 0 min  Stress: No Stress Concern Present (05/05/2021)   [place]    Feeling of Stress : Only a little  Social Connections: Socially Isolated (05/05/2021)   Social Connection and Isolation Panel [NHANES]    Frequency of Communication with Friends and Family: More than three times a week    Frequency of Social Gatherings with Friends and Family: Never    Attends Religious Services: Never    Marine scientist or Organizations: No    Attends Archivist Meetings: Never    Marital Status: Never married  Intimate Partner Violence: Not At Risk (05/05/2021)   Humiliation, Afraid, Rape, and Kick questionnaire    Fear of Current or Ex-Partner: No    Emotionally Abused: No    Physically Abused: No    Sexually Abused: No    Past Surgical  History:  Procedure Laterality Date   FINGER SURGERY  01/29/2017   last 2 fingers left hand   FOOT SURGERY  2016   Bil/ foot reconstruction due to RA   KNEE SURGERY Bilateral    Right knee-2010, Left knee -2006   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     citalopram  (CELEXA) 20 MG tablet Take 1 tablet (20 mg total) by mouth daily. 90 tablet 1   Ferrous Sulfate Dried (SLOW RELEASE IRON) 45 MG TBCR Take 1 tablet by mouth 2 (two) times daily. 60 tablet 0   lisinopril (ZESTRIL) 10 MG tablet TAKE 1 TABLET(10 MG) BY MOUTH DAILY 90 tablet 0   Multiple Vitamins-Minerals (CENTRUM SILVER ULTRA WOMENS PO) Take 1 tablet by mouth daily.      RESTASIS 0.05 % ophthalmic emulsion 1 drop 2 (two) times daily.     RINVOQ 15 MG TB24      methotrexate 2.5 MG tablet Take 25 mg by mouth once a week. Thursdays (Patient not taking: Reported on 12/27/2021)     No current facility-administered medications on file prior to visit.    There were no vitals taken for this visit.      Objective:   Physical Exam Vitals and nursing note reviewed.  Constitutional:      Appearance: Normal appearance.  Cardiovascular:     Rate and Rhythm: Normal rate and regular rhythm.     Pulses: Normal pulses.     Heart sounds: Normal heart sounds.  Pulmonary:     Effort: Pulmonary effort is normal.     Breath sounds: Normal breath sounds.  Musculoskeletal:        General: Normal range of motion.  Skin:    General: Skin is warm and dry.  Neurological:     General: No focal deficit present.     Mental Status: She is alert and oriented to person, place, and time.     Gait: Gait abnormal (slow steady gait with walker).  Psychiatric:        Mood and Affect: Mood normal.        Behavior: Behavior normal.        Thought Content: Thought content normal.        Judgment: Judgment normal.       Assessment & Plan:  1. Pre-operative clearance - lab work will be done two weeks prior to surgery.  - EKG will be done at her cardiology appointment  - Will check chest xray today  - DG Chest 2 View; Future  Dorothyann Peng, NP

## 2021-12-27 NOTE — Telephone Encounter (Signed)
Spoke with patient's daughter (DPR on file) and want patient to have an in person visit instead of tele visit. I have scheduled patient for 8/16 at 12:15 pm with Estella Husk. Pt's daughter verbalized understanding and thanked me for the call.

## 2021-12-29 NOTE — Progress Notes (Signed)
Cardiology Office Note    Date:  01/04/2022   ID:  Bonney Berres, DOB [birth year], MRN [id number]   PCP:  Dorothyann Peng, NP   [place]  Cardiologist:  Larae Grooms, MD   Advanced Practice Provider:  No care team member to display Electrophysiologist:  None   [id number]   Chief Complaint  Patient presents with   Pre-op Exam    History of Present Illness:  Rachel Austin is a 66 y.o. female who saw Dr. Irish Lack 04/2021 for syncope. She had seen neuro and was orthostatic. BP meds adjusted and increase hydration. Ziac stopped b/c of bradycardia and hypotension and put on low dose lisinopril by PCP. She had improved with these changes. She was told to call if any presyncope and we'd place a Zio and check and echo.  She is on my schedule for Preop clearance for total hip replacemnt by Dr. [name] date TBD. Patient denies chest pain, dyspnea, dizziness, syncope, edema. No regular exercise. Goes up/down stairs. Walks with a cane due to hip pain. Labs by rheumatology regularly.    Past Medical History:  Diagnosis Date   Acute blood loss anemia 06/01/2018   Anemia    Anemia, iron deficiency 06/01/2018   Cataract 2018   small, bilateral   Depression    Hyperlipidemia    Hypertension    Rheumatoid arthritis ([place])    on meds    Past Surgical History:  Procedure Laterality Date   FINGER SURGERY  01/29/2017   last 2 fingers left hand   FOOT SURGERY  2016   Bil/ foot reconstruction due to RA   KNEE SURGERY Bilateral    Right knee-2010, Left knee -2006   TOTAL HIP ARTHROPLASTY Right 05/31/2018   Procedure: TOTAL HIP ARTHROPLASTY;  Surgeon: [name], MD;  Location: [facility];  Service: Orthopedics;  Laterality: Right;   VARICOSE VEIN SURGERY  2015   right     Current Medications: Current Meds  Medication Sig   Calcium Carb-Cholecalciferol (CALCIUM + D3 PO) Take 1 tablet by mouth daily.   citalopram (CELEXA) 20 MG tablet TAKE 1  TABLET(20 MG) BY MOUTH DAILY   Ferrous Sulfate Dried (SLOW RELEASE IRON) 45 MG TBCR Take 1 tablet by mouth 2 (two) times daily.   lisinopril (ZESTRIL) 10 MG tablet TAKE 1 TABLET(10 MG) BY MOUTH DAILY   methotrexate 2.5 MG tablet Take 25 mg by mouth once a week. Thursdays   Multiple Vitamins-Minerals (CENTRUM SILVER ULTRA WOMENS PO) Take 1 tablet by mouth daily.    RESTASIS 0.05 % ophthalmic emulsion 1 drop 2 (two) times daily.   RINVOQ 15 MG TB24      Allergies:   Patient has no known allergies.   Social History   Socioeconomic History   Marital status: Single    Spouse name: Not on file   Number of children: Not on file   Years of education: Not on file   Highest education level: Not on file  Occupational History   Not on file  Tobacco Use   Smoking status: Never   Smokeless tobacco: Never  Vaping Use   Vaping Use: Never used  Substance and Sexual Activity   Alcohol use: No    Alcohol/week: 0.0 standard drinks of alcohol   Drug use: No   Sexual activity: Never  Other Topics Concern   Not on file  Social History Narrative   Moved from [place] to [place] in December   Social Determinants of  Health   Financial Resource Strain: Low Risk  (05/05/2021)   Overall Financial Resource Strain (CARDIA)    Difficulty of Paying Living Expenses: Not hard at all  Food Insecurity: No Food Insecurity (05/05/2021)   Hunger Vital Sign    Worried About Running Out of Food in the Last Year: Never true    Ran Out of Food in the Last Year: Never true  Transportation Needs: No Transportation Needs (05/05/2021)   PRAPARE - Hydrologist (Medical): No    Lack of Transportation (Non-Medical): No  Physical Activity: Inactive (05/05/2021)   Exercise Vital Sign    Days of Exercise per Week: 0 days    Minutes of Exercise per Session: 0 min  Stress: No Stress Concern Present (05/05/2021)   [place]    Feeling  of Stress : Only a little  Social Connections: Socially Isolated (05/05/2021)   Social Connection and Isolation Panel [NHANES]    Frequency of Communication with Friends and Family: More than three times a week    Frequency of Social Gatherings with Friends and Family: Never    Attends Religious Services: Never    Marine scientist or Organizations: No    Attends Music therapist: Never    Marital Status: Never married     Family History:  The patient's  family history includes Heart disease in her mother.   ROS:   Please see the history of present illness.    ROS All other systems reviewed and are negative.   PHYSICAL EXAM:   VS:  BP 110/70 (BP Location: Left Arm, Patient Position: Sitting, Cuff Size: Normal)   Pulse 69   Ht '4\' 9"'$  (1.448 m)   Wt 141 lb (64 kg)   BMI 30.51 kg/m   Physical Exam  GEN: Well nourished, well developed, in no acute distress  Neck: no JVD, carotid bruits, or masses Cardiac:RRR; no murmurs, rubs, or gallops  Respiratory:  clear to auscultation bilaterally, normal work of breathing GI: soft, nontender, nondistended, + BS Ext: without cyanosis, clubbing, or edema, Good distal pulses bilaterally Neuro:  Alert and Oriented x 3,  Psych: euthymic mood, full affect  Wt Readings from Last 3 Encounters:  01/04/22 141 lb (64 kg)  12/27/21 137 lb (62.1 kg)  05/18/21 149 lb (67.6 kg)      Studies/Labs Reviewed:   EKG:  EKG is  ordered today.  The ekg ordered today demonstrates NSR normal EKG  Recent Labs: 02/21/2021: ALT 21; BUN 23; Creatinine, Ser 0.68; Hemoglobin 11.3; Platelets 314.0; Potassium 3.9; Sodium 138   Lipid Panel    Component Value Date/Time   CHOL 207 (H) 08/26/2020 1105   TRIG 112.0 08/26/2020 1105   HDL 54.90 08/26/2020 1105   CHOLHDL 4 08/26/2020 1105   VLDL 22.4 08/26/2020 1105   LDLCALC 130 (H) 08/26/2020 1105    Additional studies/ records that were reviewed today include:      Risk  Assessment/Calculations:         ASSESSMENT:    1. Preoperative clearance   2. Syncope and collapse   3. Essential hypertension      PLAN:  In order of problems listed above:  Preop clearance for total hip replacemnt by Dr. [name] date TBD. Patient without cardiac symptoms and METs>5. No further syncope or hypotension or bradycardia off Ziac. Low risk surgery, no need for further cardiac work up prior to surgery.  According to the Revised Cardiac Risk Index (RCRI), her Perioperative Risk of Major Cardiac Event is (%): 0.4  Her Functional Capacity in METs is: 6.05 according to the Duke Activity Status Index (DASI).   History of Syncope with fall secondary to orthostatic hypotension improved with stopping Ziac and increase hydration-no recurrence. F/u with Dr. Irish Lack prn   HTN controlled on lisinopril  Shared Decision Making/Informed Consent        Medication Adjustments/Labs and Tests Ordered: Current medicines are reviewed at length with the patient today.  Concerns regarding medicines are outlined above.  Medication changes, Labs and Tests ordered today are listed in the Patient Instructions below. Patient Instructions  Medication Instructions:  Your physician recommends that you continue on your current medications as directed. Please refer to the Current Medication list given to you today.  *If you need a refill on your cardiac medications before your next appointment, please call your pharmacy*   Lab Work: None ordered   If you have labs (blood work) drawn today and your tests are completely normal, you will receive your results only by: [place] (if you have MyChart) OR A paper copy in the mail If you have any lab test that is abnormal or we need to change your treatment, we will call you to review the results.   Testing/Procedures: None ordered    Follow-Up: Follow up with our office as needed }    Other Instructions   Important  Information About Sugar         Weston Brass Ermalinda Barrios, PA-C  01/04/2022 12:43 PM    [address], [place], [place]  [address] Phone: [phone]; Fax: [phone]

## 2022-01-03 ENCOUNTER — Other Ambulatory Visit: Payer: Self-pay | Admitting: Adult Health

## 2022-01-03 DIAGNOSIS — I10 Essential (primary) hypertension: Secondary | ICD-10-CM

## 2022-01-03 DIAGNOSIS — F329 Major depressive disorder, single episode, unspecified: Secondary | ICD-10-CM

## 2022-01-04 ENCOUNTER — Ambulatory Visit: Payer: Medicare Other | Admitting: Physician Assistant

## 2022-01-04 ENCOUNTER — Encounter: Payer: Self-pay | Admitting: Physician Assistant

## 2022-01-04 VITALS — BP 110/70 | HR 69 | Ht <= 58 in | Wt 141.0 lb

## 2022-01-04 DIAGNOSIS — I10 Essential (primary) hypertension: Secondary | ICD-10-CM

## 2022-01-04 DIAGNOSIS — Z01818 Encounter for other preprocedural examination: Secondary | ICD-10-CM

## 2022-01-04 DIAGNOSIS — M79643 Pain in unspecified hand: Secondary | ICD-10-CM | POA: Diagnosis not present

## 2022-01-04 DIAGNOSIS — M1990 Unspecified osteoarthritis, unspecified site: Secondary | ICD-10-CM | POA: Diagnosis not present

## 2022-01-04 DIAGNOSIS — E785 Hyperlipidemia, unspecified: Secondary | ICD-10-CM

## 2022-01-04 DIAGNOSIS — M25552 Pain in left hip: Secondary | ICD-10-CM | POA: Diagnosis not present

## 2022-01-04 DIAGNOSIS — H16221 Keratoconjunctivitis sicca, not specified as Sjogren's, right eye: Secondary | ICD-10-CM | POA: Diagnosis not present

## 2022-01-04 DIAGNOSIS — R55 Syncope and collapse: Secondary | ICD-10-CM | POA: Diagnosis not present

## 2022-01-04 DIAGNOSIS — Z79899 Other long term (current) drug therapy: Secondary | ICD-10-CM | POA: Diagnosis not present

## 2022-01-04 DIAGNOSIS — M0579 Rheumatoid arthritis with rheumatoid factor of multiple sites without organ or systems involvement: Secondary | ICD-10-CM | POA: Diagnosis not present

## 2022-01-04 NOTE — Patient Instructions (Signed)
Medication Instructions:  Your physician recommends that you continue on your current medications as directed. Please refer to the Current Medication list given to you today.  *If you need a refill on your cardiac medications before your next appointment, please call your pharmacy*   Lab Work: None ordered   If you have labs (blood work) drawn today and your tests are completely normal, you will receive your results only by: [place] (if you have MyChart) OR A paper copy in the mail If you have any lab test that is abnormal or we need to change your treatment, we will call you to review the results.   Testing/Procedures: None ordered    Follow-Up: Follow up with our office as needed }    Other Instructions   Important Information About Sugar

## 2022-01-30 DIAGNOSIS — H0102B Squamous blepharitis left eye, upper and lower eyelids: Secondary | ICD-10-CM | POA: Diagnosis not present

## 2022-01-30 DIAGNOSIS — H17823 Peripheral opacity of cornea, bilateral: Secondary | ICD-10-CM | POA: Diagnosis not present

## 2022-01-30 DIAGNOSIS — H2513 Age-related nuclear cataract, bilateral: Secondary | ICD-10-CM | POA: Diagnosis not present

## 2022-01-30 DIAGNOSIS — H40013 Open angle with borderline findings, low risk, bilateral: Secondary | ICD-10-CM | POA: Diagnosis not present

## 2022-01-30 DIAGNOSIS — H0102A Squamous blepharitis right eye, upper and lower eyelids: Secondary | ICD-10-CM | POA: Diagnosis not present

## 2022-02-09 ENCOUNTER — Telehealth: Payer: Self-pay | Admitting: *Deleted

## 2022-02-09 ENCOUNTER — Encounter: Payer: Self-pay | Admitting: *Deleted

## 2022-02-09 ENCOUNTER — Telehealth: Payer: Self-pay

## 2022-02-09 DIAGNOSIS — I10 Essential (primary) hypertension: Secondary | ICD-10-CM

## 2022-02-09 NOTE — Patient Instructions (Signed)
Visit Information  Thank you for taking time to visit with me today. Please don't hesitate to contact me if I can be of assistance to you.   Following are the goals we discussed today:   Goals Addressed               This Visit's Progress     COMPLETED: Need food resources (pt-stated)        Care Coordination Interventions: Reviewed medications with patient and discussed adherence and educated accordingly Reviewed scheduled/upcoming provider appointments including pending appointments and verified AWV scheduled 05/08/2022 @ 3:15 PM  Care Guide referral for Food resources Screening for signs and symptoms of depression related to chronic disease state  Assessed social determinant of health barriers         Please call the care guide team at [phone] if you need to cancel or reschedule your appointment.   If you are experiencing a Mental Health or  or need someone to talk to, please call the Suicide and Crisis Lifeline: 988 call the [place] National Suicide Prevention Lifeline: 228-844-7302 or TTY: 260-259-0784 TTY 7310841381) to talk to a trained counselor call 1-800-273-TALK (toll free, 24 hour hotline)  Patient verbalizes understanding of instructions and care plan provided today and agrees to view in [place]. Active MyChart status and patient understanding of how to access instructions and care plan via MyChart confirmed with patient.     No further follow up required: No further needs at this time.  Raina Mina, RN Care Management Coordinator [place] Office [phone]

## 2022-02-09 NOTE — Telephone Encounter (Signed)
   Telephone encounter was:  Successful.  02/09/2022 Name: [name] MRN: [id number] DOB: 02-Oct-1955  [name] is a 66 y.o. year old female who is a primary care patient of Dorothyann Peng, NP . The community resource team was consulted for assistance with [name] guide performed the following interventions: Daughter/Patient provided with information about care guide support team and interviewed to confirm resource needs. Daughter advised meal assistance is needed along with financial support. Daughter would like for mom to go on MOWs waiting list. Daughter also is interested in food pantries, however, at this time, Daughter/patient are currently busy and would like a call back Monday. CG will work on getting patient added to MOWs and outreach for other resources that are available at this time.  Follow Up Plan:  Care guide will follow up with patient by phone over the next few days  [place], [place]  Main Phone: [phone]  E-mail: [email or web address]  Website: [email or web address]

## 2022-02-09 NOTE — Patient Outreach (Signed)
  Care Coordination   Initial Visit Note   02/09/2022 Name: Rachel Austin MRN: [id number] DOB: 08-10-1955  Rachel Austin is a 66 y.o. year old female who sees Nafziger, Tommi Rumps, NP for primary care. I  spoke with DPR daughter Rachel Austin (pt speaks Spanish only).  What matters to the patients health and wellness today?  Food resources    Goals Addressed               This Visit's Progress     COMPLETED: Need food resources (pt-stated)        Care Coordination Interventions: Reviewed medications with patient and discussed adherence and educated accordingly Reviewed scheduled/upcoming provider appointments including pending appointments and verified AWV scheduled 05/08/2022 @ 3:15 PM  Care Guide referral for Food resources Screening for signs and symptoms of depression related to chronic disease state  Assessed social determinant of health barriers          SDOH assessments and interventions completed:  Yes  SDOH Interventions Today    Flowsheet Row Most Recent Value  SDOH Interventions   Food Insecurity Interventions Ambulatory [id number] Order  Housing Interventions Intervention Not Indicated  Transportation Interventions Intervention Not Indicated  Utilities Interventions Intervention Not Indicated        Care Coordination Interventions Activated:  Yes  Care Coordination Interventions:  Yes, provided   Follow up plan: No further intervention required.   Encounter Outcome:  Pt. Visit Completed   Raina Mina, RN Care Management Coordinator [place] Office [phone]

## 2022-02-13 ENCOUNTER — Telehealth: Payer: Self-pay

## 2022-02-13 NOTE — Telephone Encounter (Signed)
   Telephone encounter was:  Unsuccessful.  02/13/2022 Name: [name] MRN: [id number] DOB: [birth year]  Unsuccessful outbound call made today to assist with:  Food Insecurity and Financial Difficulties related to bills  Outreach Attempt:  2nd Attempt  A HIPAA compliant voice message was left requesting a return call.  Instructed patient to call back at [phone] at their earliest convenience.  [facility] management  [place], [place]  Main Phone: [phone]  E-mail: [email or web address]  Website: [email or web address]

## 2022-02-14 ENCOUNTER — Telehealth: Payer: Self-pay

## 2022-02-15 ENCOUNTER — Telehealth: Payer: Self-pay

## 2022-02-16 ENCOUNTER — Telehealth: Payer: Self-pay

## 2022-02-16 NOTE — Telephone Encounter (Signed)
   Telephone encounter was:  Unsuccessful.  02/16/2022 Name: Rachel Austin MRN: [id number] DOB: November 02, 1955  Unsuccessful outbound call made today to assist with:  Food Insecurity and Financial Difficulties related to bills  Outreach Attempt:  3rd Attempt  A HIPAA compliant voice message was left requesting a return call.  Instructed patient to call back at [phone] at their earliest convenience.  CG called daughter and left voicemail. CG called spanish interpreter to see if mom would answer and she did not. Interpreter left a voicemail. Daughter initially advised she handles everything for patient, however, CG has not been able to speak to daughter as the first encounter was cut short due to patient/daughter being busy.  CG spoke to patient/daughter briefly on 9/21. Daughter requested call back for 9/25. CG called on 9/25 and daughter advised she was at work and would return call. No callback on 9/25 from daughter. CG e-mailed daughter on 9/26, no response back. CG called daughter and left a voicemail and sent e-mail regarding resources and updates on 9/27. CG encouraged daughter to return call asap in order for provided further assistance if needed. CG called patient with spanish interpreter and left a voicemail. CG also called daughter who did answer but advised she was at work and received the resources that were sent and that at this time, she will call/e-mail back when she can.  Resources sent:  -[place]  -Pt added on [employer] 9/21   Referral closed.  [facility] management  [place], [place]  Main Phone: [phone]  E-mail: [email or web address]  Website: [email or web address]

## 2022-03-02 DIAGNOSIS — M1612 Unilateral primary osteoarthritis, left hip: Secondary | ICD-10-CM | POA: Diagnosis not present

## 2022-03-07 NOTE — Patient Instructions (Addendum)
DEBIDO AL COVID-19 SLO SE PERMITEN DOS VISITANTES (de 16 aos en adelante)  PARA QUE VENGAN CON USTED Y SE QUEDEN EN LA SALA DE ESPERA SOLAMENTE DURANTE EL PRE OP Y EL PROCEDIMIENTO.   **NO SE PERMITEN VISITAS EN EL REA DE CORTA ESTADA NI EN LA SALA DE RECUPERACIN!!**  SI VA A SER INGRESADO(A) AL HOSPITAL SLO SE LE PERMITEN CUATRO PERSONAS DE APOYO DURANTE LAS HORAS DE VISITA (7 AM -8PM)   La(s) persona(s) de apoyo debe(n) pasar nuestra evaluacin, entrar y salir con gel y Teaching laboratory technician en todo momento, incluso en la habitacin del [place]. Los pacientes tambin deben usar una mscara cuando el personal o su persona de apoyo estn en la habitacin. Los visitantes DEBEN LLEVAR ETIQUETA DE VISITANTE DE UNA MANERA VISIBLE. Un visitante adulto International aid/development worker con usted durante la noche y DEBE estar en la habitacin a las 8 P.M.     Su procedimiento est programado en: 03/31/22   Presntese a la entrada [place]     Presntese a admisiones por la maana: 5:15 AM   Llame a este nmero si tiene [employer] maana de la ciruga al [phone]   No consuma alimentos: Despus de la medianoche   Despus de la medianoche puede tomar los siguientes lquidos [employer] la(s) : 4:30 AM DEL DA DE LA CIRUGA  Agua Caf negro (con azcar, SIN [place], NI CREMA)  T normal y descafeinado (con azcar, SIN [place], NI CREMA)                              Gelatina normal (NO ROJA)                                           Helados de frutas (sin pulpa. NO DE COLOR ROJO)                                     Helados de hielo (NO ROJO)                                                                  Jugo: de Glendora Score, uva [place], arndano BLANCO Bebidas deportivas como Gatorade (NO ROJAS)  Tome 1 bebida de Ensure A LAS:  4:30 AM, el dia de la [place].     El da de la ciruga:  [employer] (1) Ensure transparente antes de la ciruga o G2 en LA [place] de la ciruga. Tmeselo de un solo.  No se lo tome de a sorbitos.  Esta bebida se le dio durante su cita preoperatoria en el hospital. No tome nada ms despus de tomarse todo el Ensure o G2 transparente antes de la ciruga.          Si tiene preguntas, por favor. Pngase en contacto con la oficina de su cirujano.    La higiene bucal tambin es importante para reducir el riesgo de infeccin.  Recuerde - LVESE LOS DIENTES EN LA MAANA DE LA CIRUGA CON SU PASTA DENTAL HABITUAL   NO fume despus de la medianoche   [employer] medicamentos en la maana de la ciruga con UN SORBO DE AGUA: citalopram.Use gotas de los ojos como lo hace siempre              No debe trae ningn metal en el cuerpo, incluyendo pinzas para el cabello, joyas, ni aretes/pendientes             No use maquillaje, lociones/cremas, polvos, perfumes/colonias o desodorante  No use esmalte de uas, incluyendo los de gel ni S&S, uas artificiales/acrlicas o cualquier otro tipo de cobertura en las uas naturales, [place] y [employer]. Si tiene uas artificiales, con capas de gel, etc. que necesite que le quiten en un saln de uas, por favor, pida que se lo quiten antes de la ciruga o la ciruga podra ser cancelada/retrasada si el cirujano o el anestesilogo consideran que no puede ser monitoreado(a) de una forma segura.   No se rasure en las 48 horas antes de la operacin.    No traiga objetos de valor al hospital. [place] NO SE HACE RESPONSABLE DE LOS OBJETOS DE VALOR.   Los contactos, las dentaduras o los puentes no se pueden usar durante la [place].   Sheela Stack una bolsa pequea para la noche el da de la [place].   NO TRAIGA AL HOSPITAL LOS MEDICAMENTOS QUE TOMA EN CASA . [place] EN SU LISTA [place]!    Los pacientes dados de alta el mismo da de la ciruga no podrn Air cabin crew a casa.  Es NECESARIO que  alguien se quede con usted durante las primeras 24 horas despus de la anestesia.   Instrucciones especiales: Ane Payment copia de sus documentos de poder notarial y testamento vital el da de su ciruga si no los ha escaneado antes.              Por favor, lea las siguientes hojas informativas que le dieron: SI TIENE PREGUNTAS SOBRE SUS INSTRUCCIONES PREOPERATORIAS POR FAVOR LLAME AL [place]                                            Preparing for Surgery  Debido a que la piel no est esterilizada, sta necesita estar lo ms libre de grmenes como sea posible.  Usted puede reducir el nmero de grmenes en la piel lavndose con el jabn de CHG (Chlorahexidine gluconate) antes de la ciruga.  El CHG es un jabn antisptico el cual mata los grmenes y se une a la piel para continuar matando los grmenes incluso hasta despus de lavarse. POR FAVOR NO LO USE SI USTED TIENE ALERGIAS AL CHG.  SI LA PIEL SE IRRITA, DEJE DE USAR EL CHG.  NO SE RASURE DURANTE AL MENOS 12 HORAS ANTES DE LA PRIMERA DUCHA CON EL CHG. Siga estas instrucciones cuidadosamente:  Dchese la noche anterior a la [place] y de nuevo en la maana de la [place]. Si decide lavarse el cabello, lvelo con su champ normal primero. Enjuague el cabello y  el cuerpo para Civil Service fast streamer. Use el CHG como lo hara con cualquier otro jabn lquido, usando una toallita o esponja vegetal o exfoliante. Aplique el CHG al cuerpo solamente DEL CUELLO PARA ABAJO.  No lo use cerca de los ojos o los genitales. No se lave con su jabn normal despus de usar el CHG. Squese con una toalla limpia. Espere hasta la maana siguiente para aplicarse desodorantes, lociones, excepto en el da de la [place], NO SE APLIQUE LOCIONES. Use pijamas limpias o una bata. Coloque sbanas limpias en su cama la noche de su primera ducha - no duerma con mascotas. 10.  Use ropa limpia al venir al hospital.         Espirmetro de incentivo (Vea este video en casa: [email or web address])  Un espirmetro de incentivo es una herramienta que puede ayudarle a Theatre manager los pulmones limpios y [place]. Esta herramienta mide la capacidad en la que se llenan los pulmones con cada respiracin. Tomar respiraciones largas y profundas puede ayudar a revertir o disminuir la probabilidad de Engineering geologist respiratorios (pulmonares) (especialmente infecciones) a consecuencia de: Un largo perodo de [employer] no puede moverse o estar activo(a). ANTES DEL PROCEDIMIENTO  Si el espirmetro incluye un indicador para mostrar su mejor esfuerzo, su enfermera o el(la) terapeuta respiratorio(a) lo ajustar a una meta deseada. Si es posible, sintese derecho(a) o ligeramente inclinado(a) hacia adelante. Trate de no encorvarse. Sujete el espirmetro de incentive en posicin erguida. INSTRUCCIONES DE USO  Sintese en el borde de la cama si es posible, o sintese lo mejor que pueda en la cama o en una silla. Sujete el espirmetro de incentivo en posicin recta. Exhale el aire normalmente. Colquese la boquilla en la boca y cierre bien los labios alrededor de ella. Inspire de forma lenta y lo ms profundamente posible, elevando el pistn o la [employer] parte superior del cilindro. Aguante le respiracin durante 3-5 segundos o tanto tiempo como sea posible. Deje que el pistn o la bolita caigan hasta la parte inferior del cilindro. Retire la boquilla de la boca y exhale normalmente. Descanse unos pocos segundos y repita los pasos del 1 al 7, al menos 10 veces cada 1-2 horas cuando est despierto(a). Tmese su tiempo y realice algunas respiraciones normales entre las respiraciones profundas. El espirmetro puede incluir un indicador para mostrar su mejor esfuerzo. Use el indicador como una meta a alcanzar por  cada respiracin. Despus de cada serie de 10 respiraciones, practique la tos para  asegurarse de que sus pulmones estn limpios. Si tiene una incisin (el corte realizado al momento de la ciruga), sujete la incisin al toser colocando una almohada o una toalla enrollada firmemente contra ella. Cuando ya pueda levantarse de la cama, camine dentro de la casa y [place]. Puede dejar de utilizar el espirmetro de incentivo cuando se lo indique el(la) que est a cargo de su cuidado.  RIESGOS Y COMPLICACIONES Tmese su tiempo para no marearse ni sentirse aturdido(a). Si tiene Social research officer, government, es posible que necesite tomar o pedir medicamento para Conservation officer, historic buildings antes de usar el espirmetro de incentivo. Es ms difcil respirar profundamente si tiene dolor. DESPUS DEL USO Descanse y respire lenta y fcilmente. Puede ser til llevar un registro de su progreso. El(la) que est a cargo de su cuidado puede darle una tabla sencilla para ayudarle con esto. Si est utilizando Teacher, music, siga estas instrucciones: BUSQUE ATENCIN MDICA SI:  Est teniendo dificultad para Therapist, music. Tiene problemas para  usar el espirmetro con la frecuencia que se le indic. Su medicamento para Conservation officer, historic buildings no le est aliviando lo suficiente cuando est usando el espirmetro. Tiene fiebre de 100.5 F (38.1 C) o ms. BUSQUE ATENCIN MDICA INMEDIATA SI:  Al toser expulse un esputo con sangre que no tena antes. Tiene fiebre de 102 F (38.9 C) o ms. Le empeora el dolor en el lugar de la incisin o cerca de l. ASEGRESE DE QUE:  Entiende estas instrucciones. Observar su condicin. Buscar ayuda de inmediato si no se siente bien o empeora. Documento publicado: 31/04/1623 Documento revisado: 07/31/2011 Documentp revisado: 11/19/2006 ExitCare Patient Information 2014 [place], [place].

## 2022-03-09 ENCOUNTER — Ambulatory Visit: Payer: Self-pay | Admitting: Physician Assistant

## 2022-03-09 DIAGNOSIS — G8929 Other chronic pain: Secondary | ICD-10-CM

## 2022-03-09 IMAGING — DX DG KNEE 1-2V*L*
2 series · 2 of 2 positions shown · non-contrast
Comparison: None.

CLINICAL DATA: Left knee pain after fall

EXAM:
LEFT KNEE - 1-2 VIEW

[knee ap]
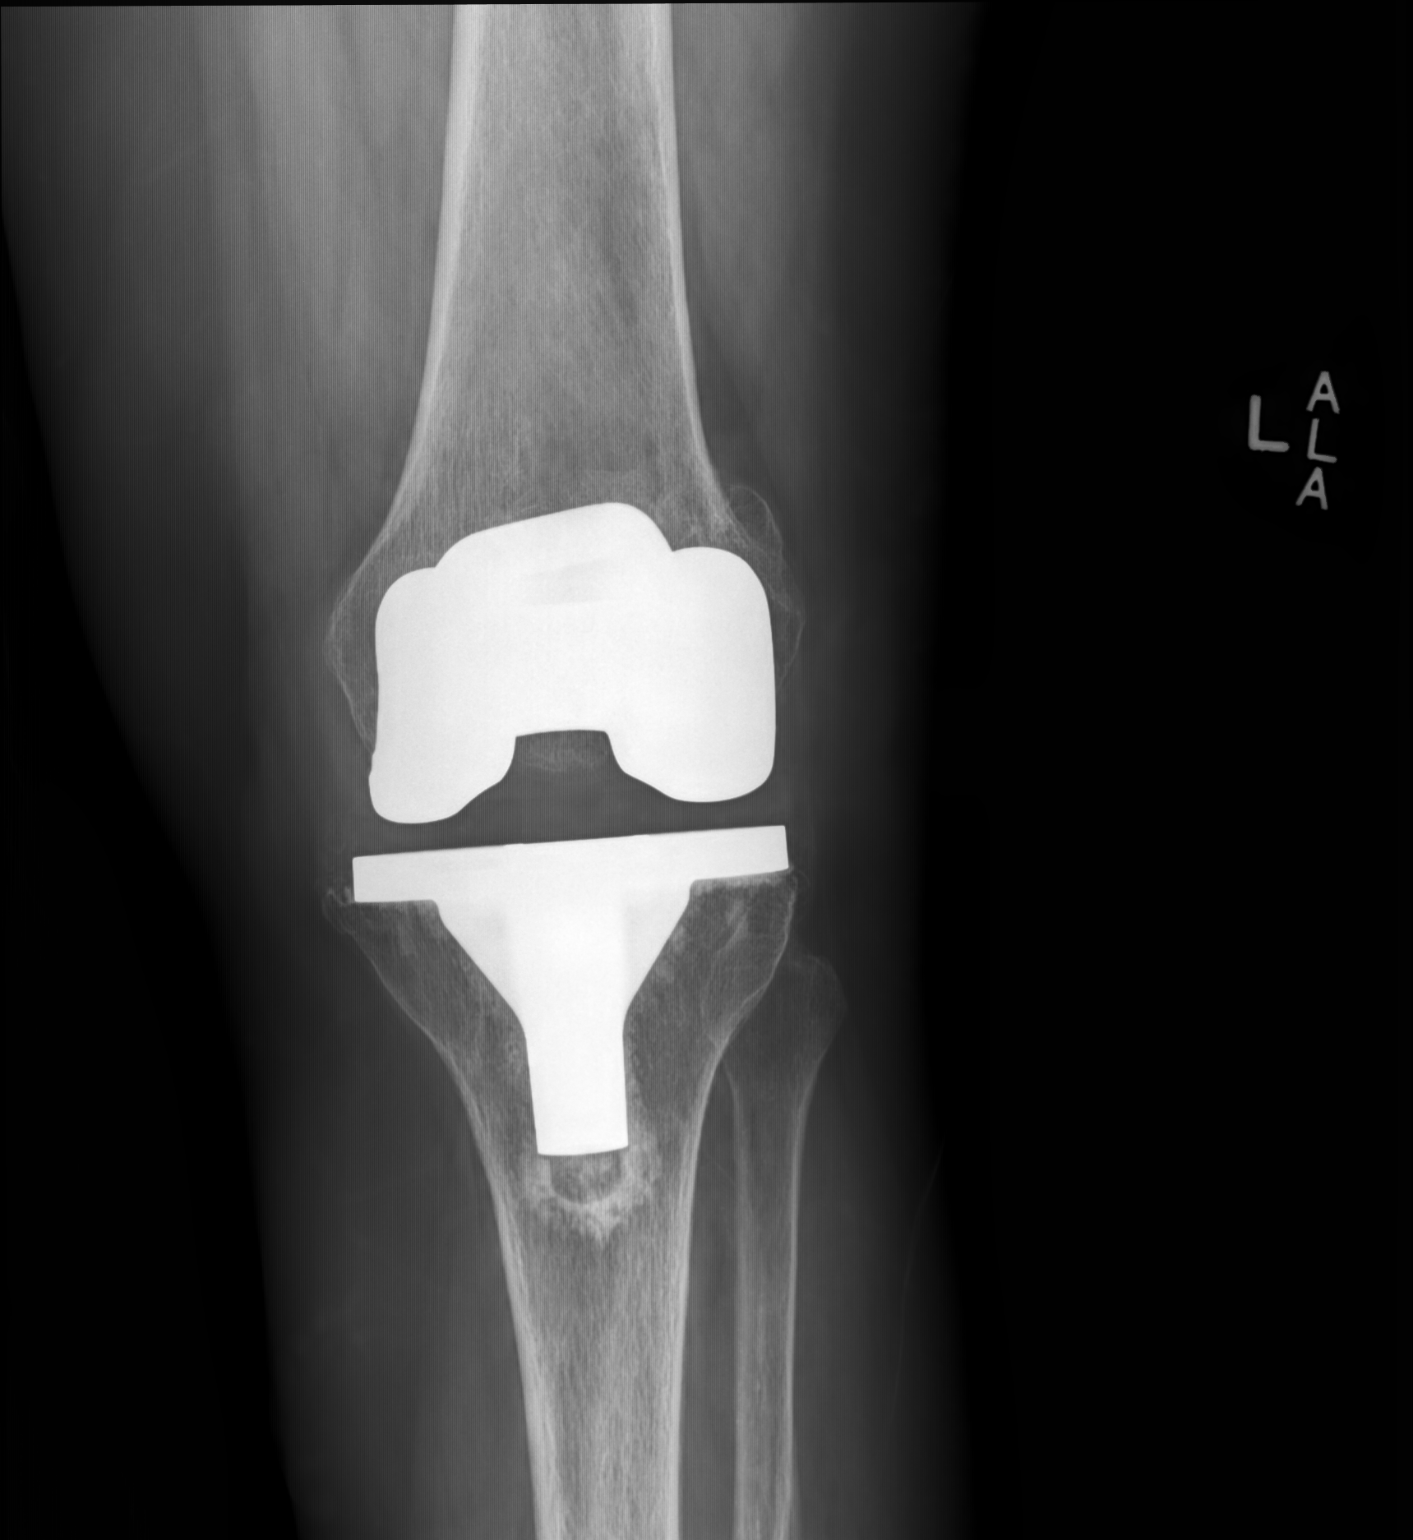

[knee lat]
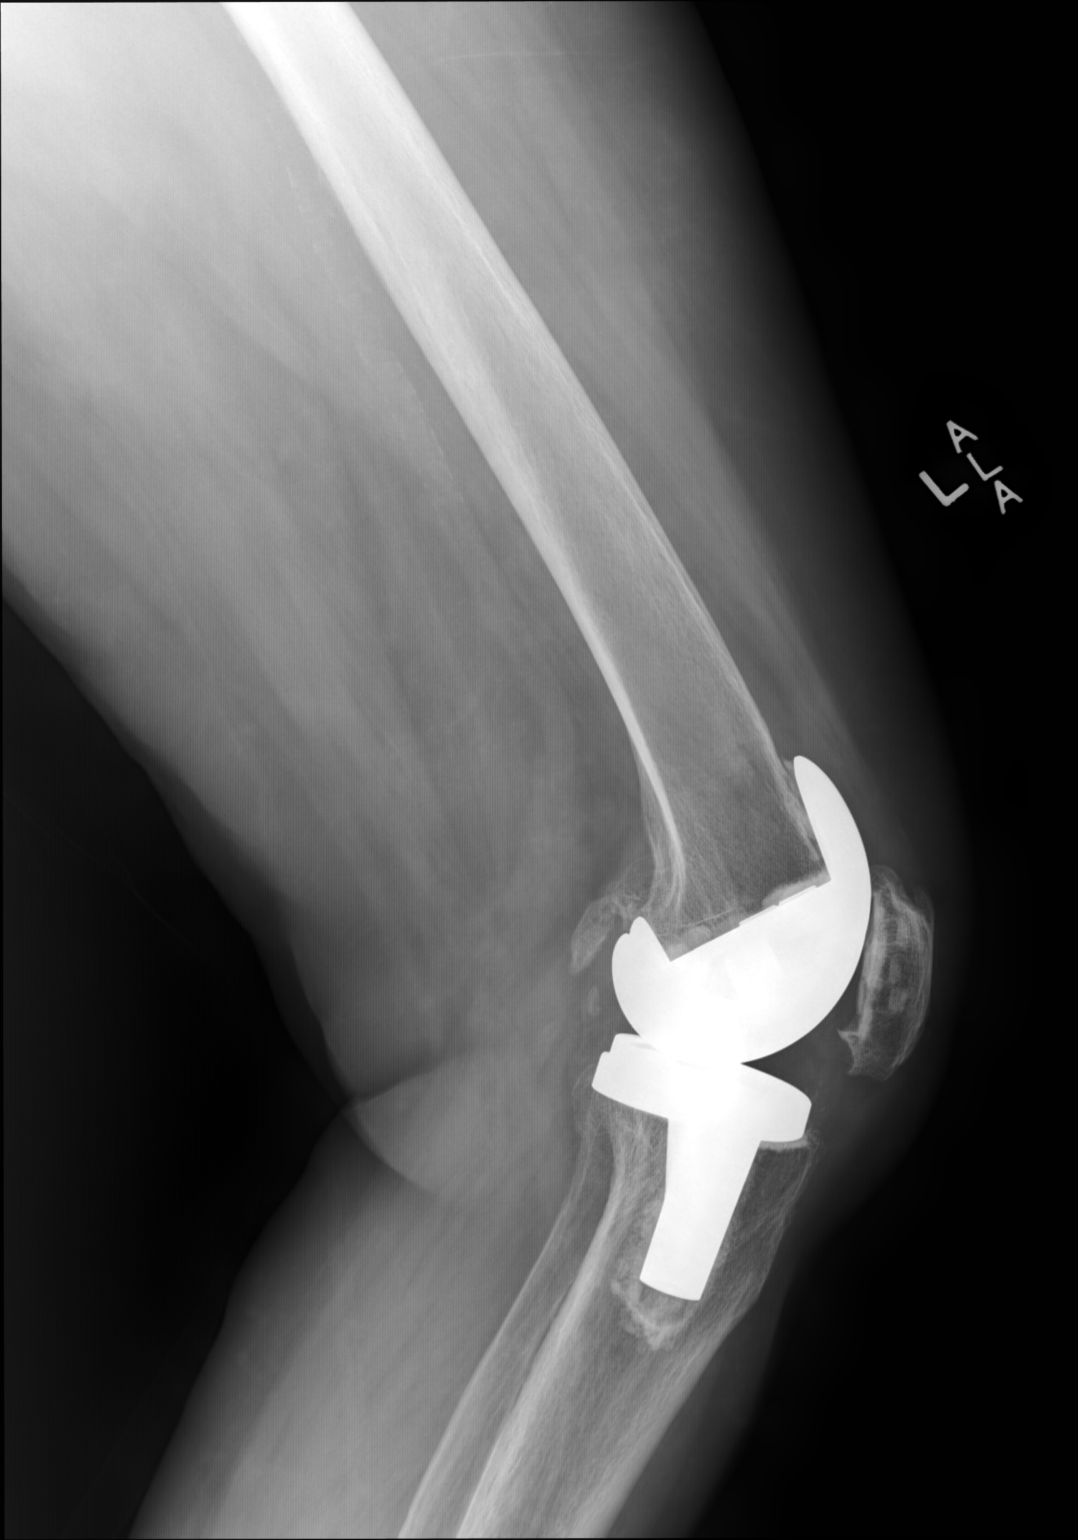

[2 of 2 positions shown; findings below may reference images not displayed]

FINDINGS: Left total knee prosthesis is well seated without periprosthetic
fracture. Lucent region adjacent to the tip of the tibial stem is
unchanged since 11/11/2018, consistent with postsurgical change
rather than loosening.
IMPRESSION: Uncomplicated left total knee prosthesis. No acute abnormality of
the left knee.

## 2022-03-09 IMAGING — DX DG CERVICAL SPINE COMPLETE 4+V
5 series · 5 of 5 positions shown · non-contrast
Comparison: None.

CLINICAL DATA: Neck pain status post fall.

EXAM:
CERVICAL SPINE - COMPLETE 4+ VIEW

[cervical spine ap]
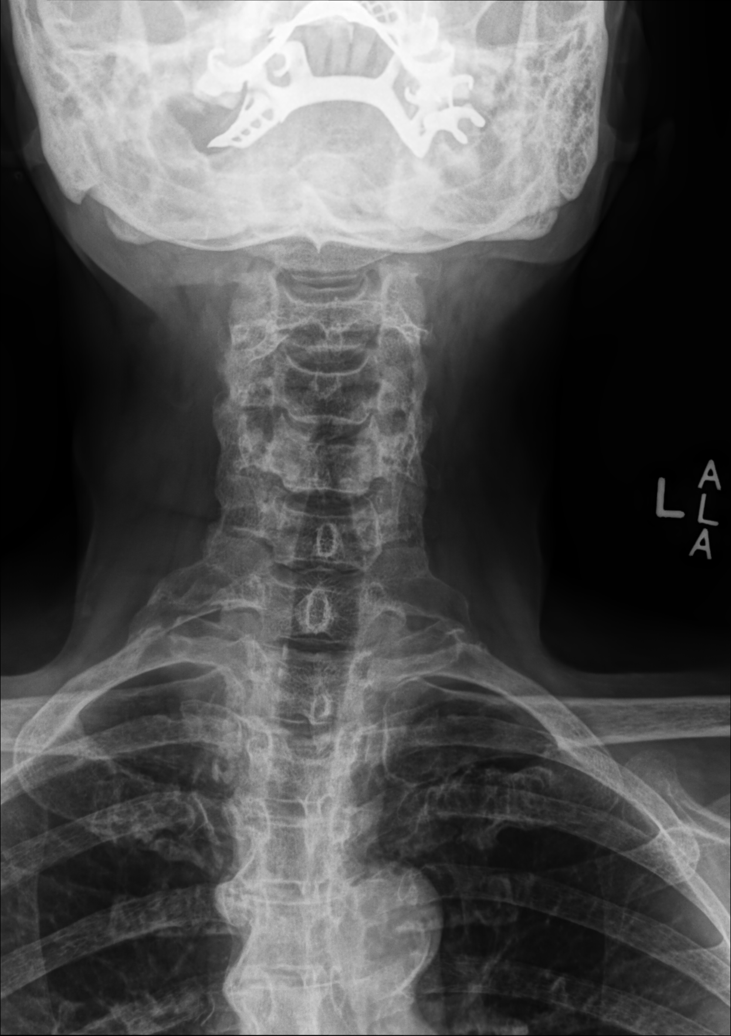

[cervical spine lat]
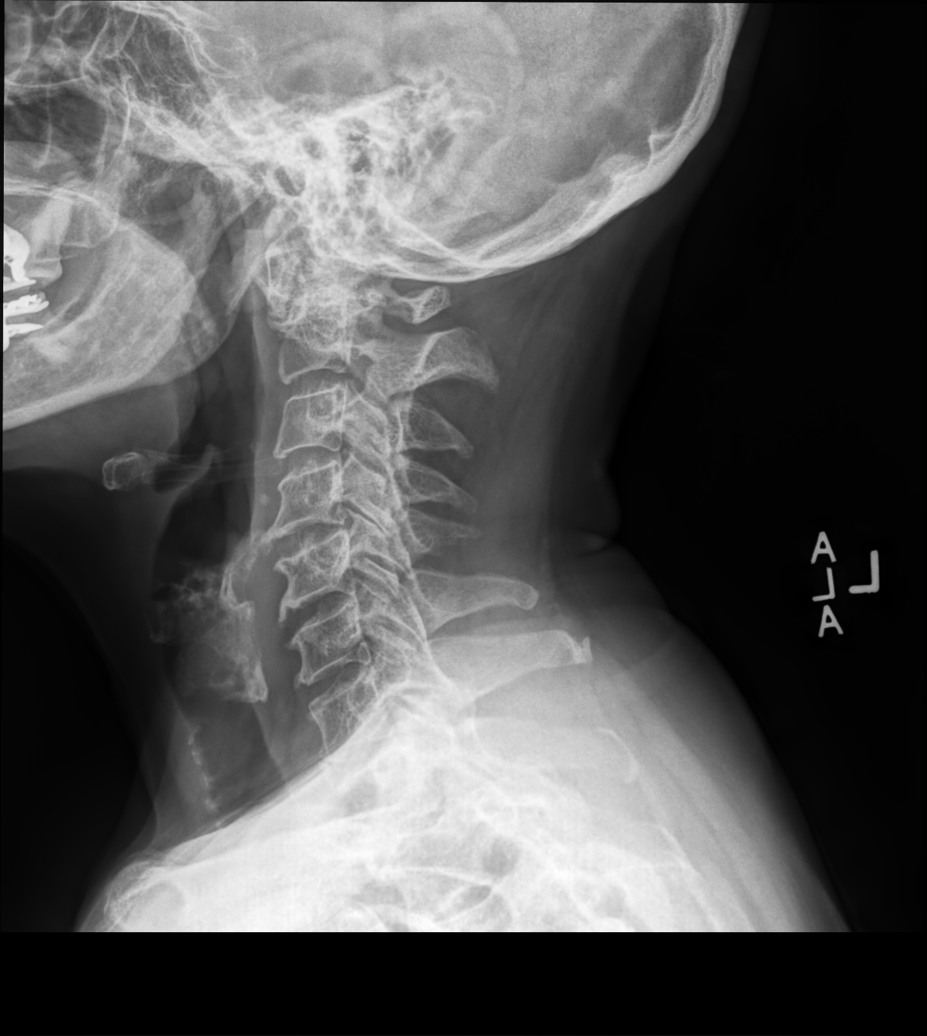

[cervical spine open mouth ap]
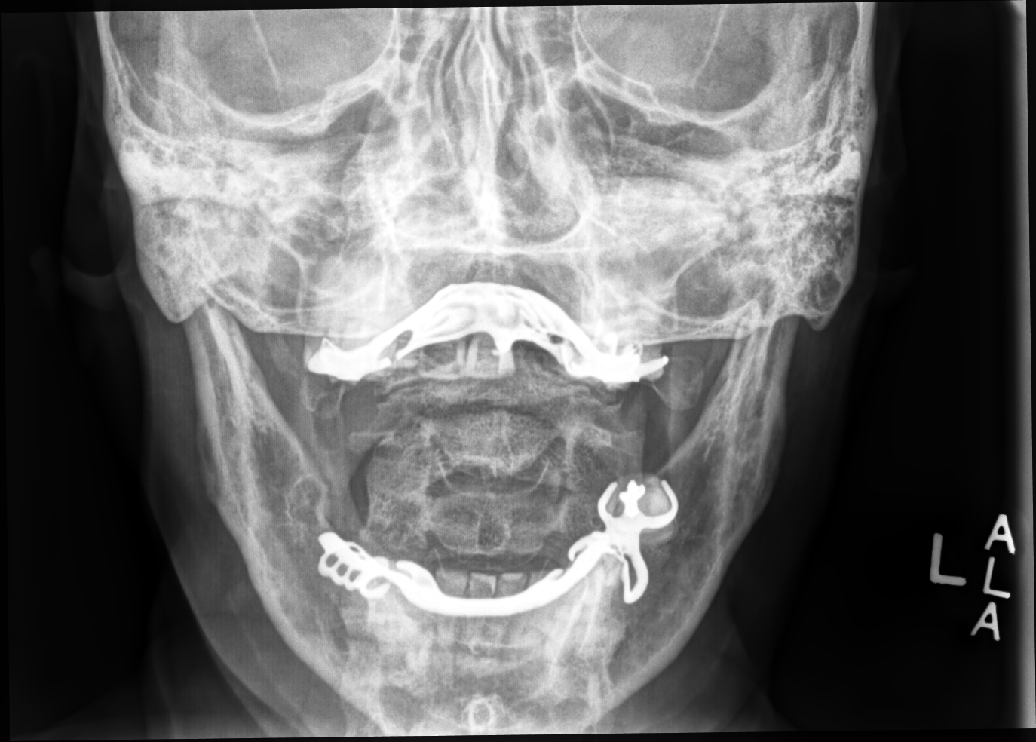

[cervical spine oblique (1 of 2)]
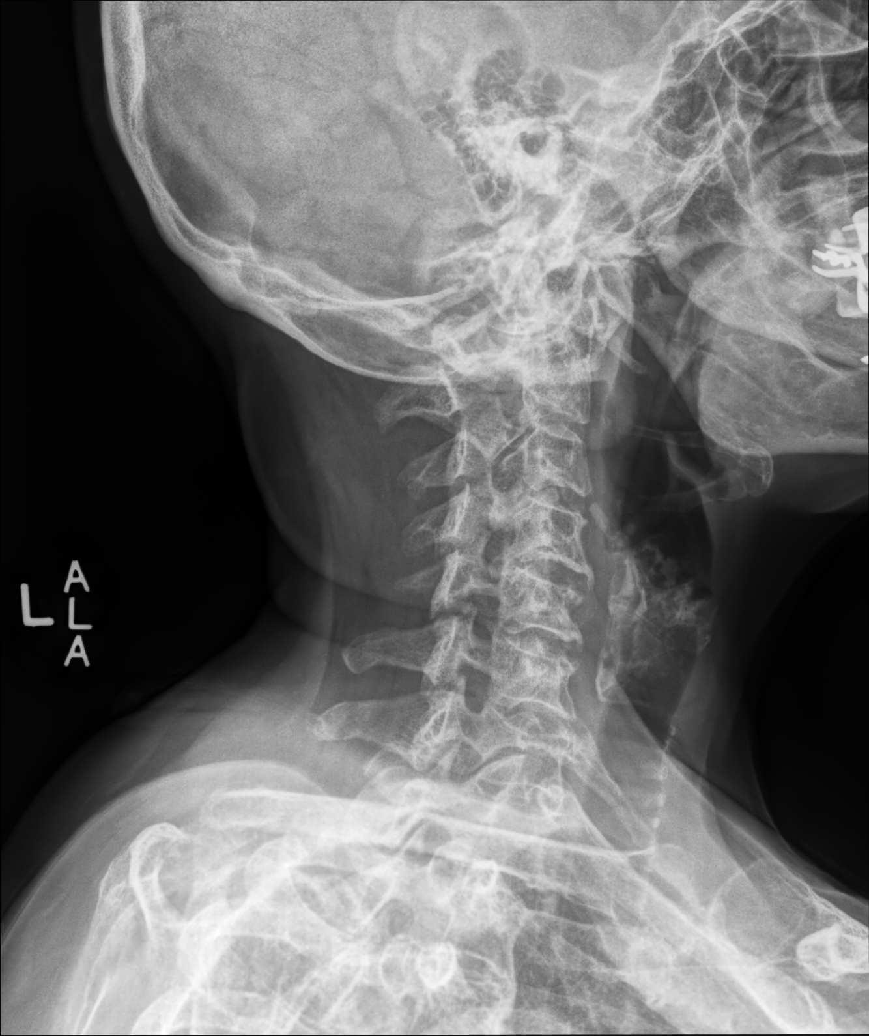

[cervical spine oblique (2 of 2)]
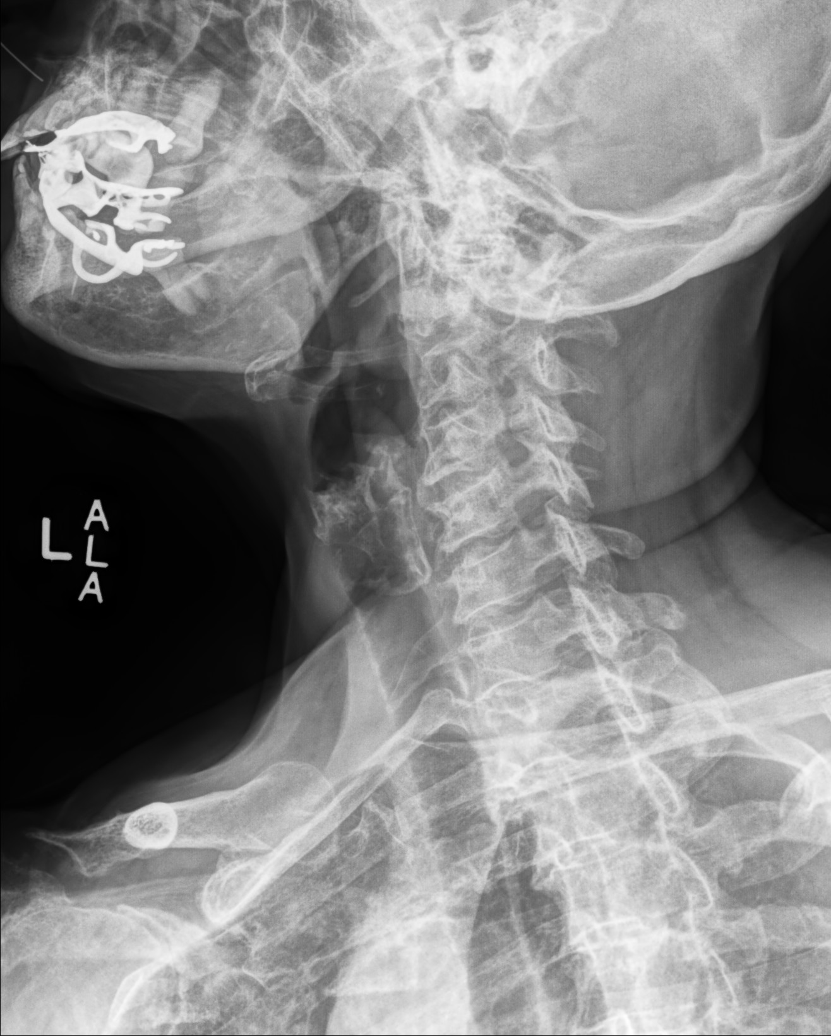

[5 of 5 positions shown; findings below may reference images not displayed]

FINDINGS: Alignment within normal limits. Vertebral body heights are
maintained. No significant disc height loss. Mild endplate spurring
seen throughout the cervical spine. Mild-to-moderate facet
degenerative changes seen throughout the cervical spine. Elongated
transverse processes seen bilaterally at C7. Mild right neural
foraminal stenosis at C4-C5. Severe right neural foraminal stenosis
at C3-C4. Moderate left neural foraminal stenosis at C3-C4.
Visualized soft tissues are unremarkable.
IMPRESSION: 1. No acute abnormality of the cervical spine.
2. Multilevel degenerative changes of the cervical spine.

## 2022-03-09 IMAGING — DX DG FACIAL BONES 1-2V
2 series · 2 of 2 positions shown · non-contrast
Comparison: None.

CLINICAL DATA: Fall injuring face.

EXAM:
FACIAL BONES - 1-2 VIEW

[skull pa]
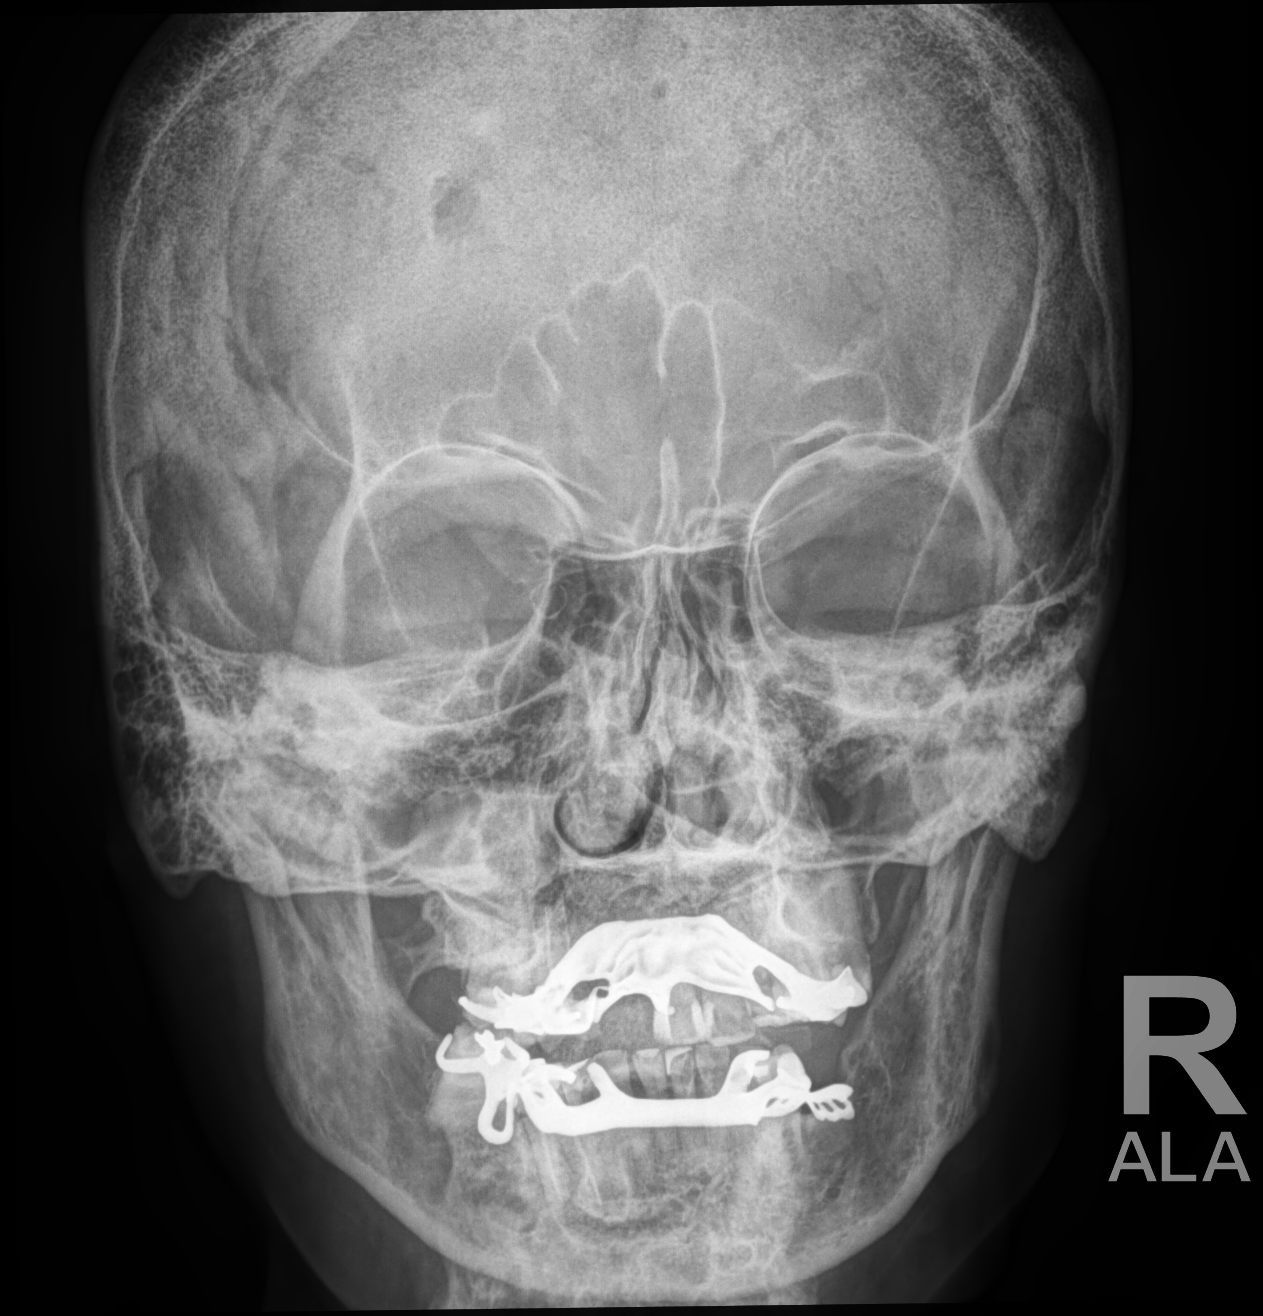

[skull lat]
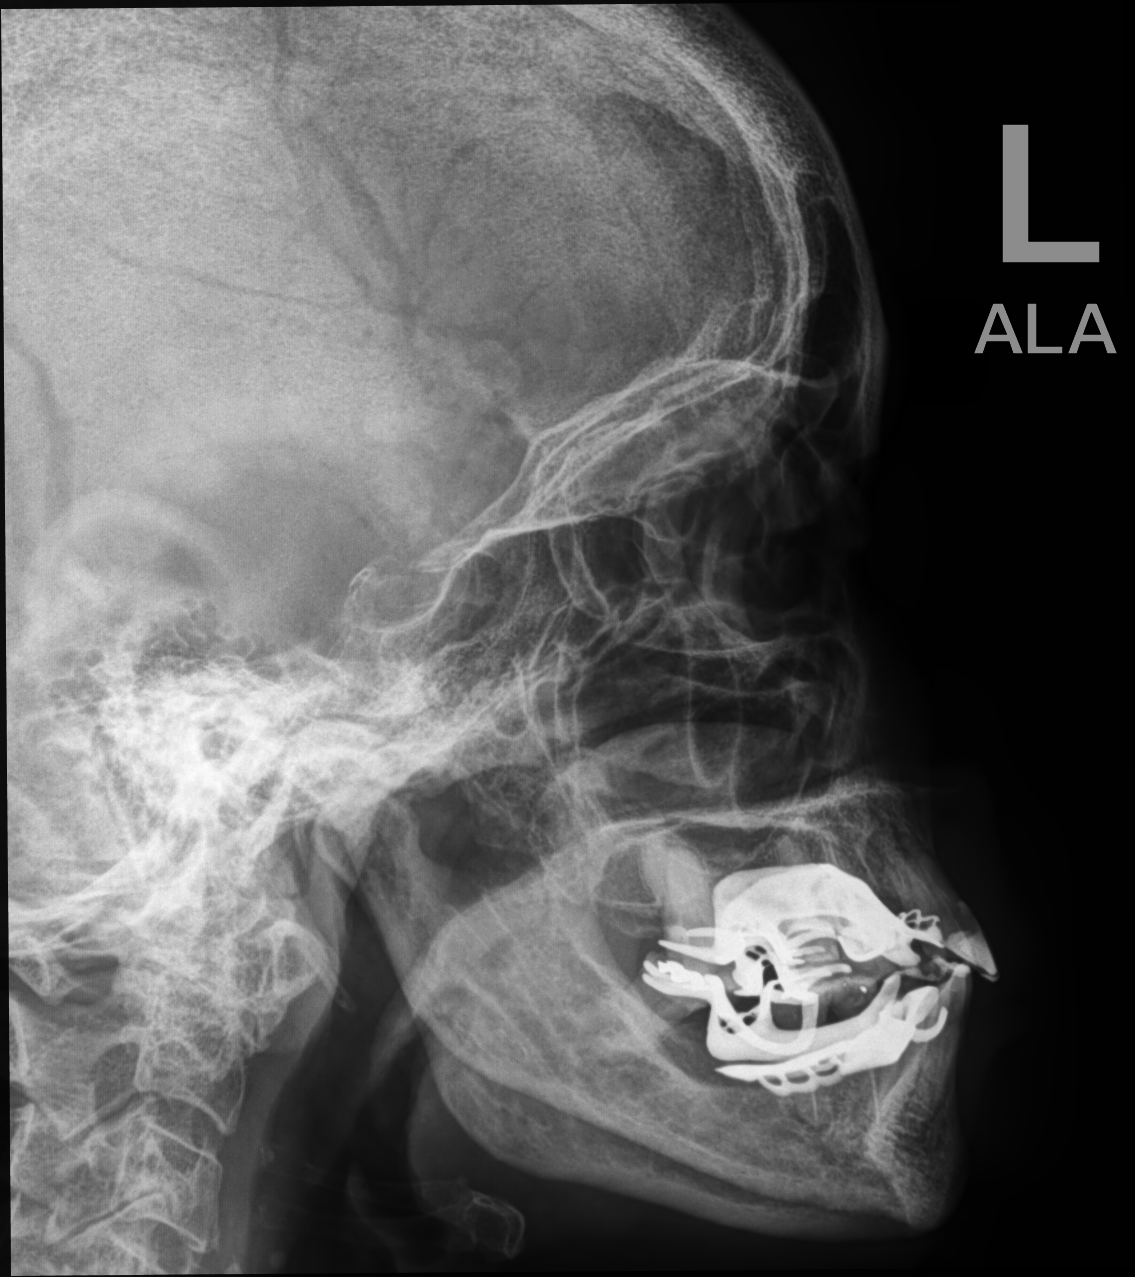

[2 of 2 positions shown; findings below may reference images not displayed]

FINDINGS: There is no evidence of fracture or other significant bone
abnormality. No orbital emphysema or sinus air-fluid levels are
seen.
IMPRESSION: Negative.

## 2022-03-09 NOTE — H&P (Signed)
TOTAL HIP ADMISSION H&P  Patient is admitted for left total hip arthroplasty.  Subjective:  Chief Complaint: left hip pain  HPI: Rachel Austin, 66 y.o. female, has a history of pain and functional disability in the left hip(s) due to arthritis and patient has failed non-surgical conservative treatments for greater than 12 weeks to include NSAID's and/or analgesics, use of assistive devices, and activity modification.  Onset of symptoms was gradual starting 5 years ago with gradually worsening course since that time.The patient noted no past surgery on the left hip(s).  Patient currently rates pain in the left hip at 8 out of 10 with activity. Patient has night pain, worsening of pain with activity and weight bearing, trendelenberg gait, pain that interfers with activities of daily living, and pain with passive range of motion. Patient has evidence of periarticular osteophytes and joint space narrowing by imaging studies. This condition presents safety issues increasing the risk of falls.  There is no current active infection.  Patient Active Problem List   Diagnosis Date Noted   Acute blood loss anemia 06/01/2018   Anemia, iron deficiency 06/01/2018   Degenerative joint disease of right hip 05/31/2018   Hyperlipidemia 03/01/2015   Essential hypertension 03/01/2015   Depression 03/01/2015   Rheumatoid arthritis ([place]) 07/16/2014   Past Medical History:  Diagnosis Date   Acute blood loss anemia 06/01/2018   Anemia    Anemia, iron deficiency 06/01/2018   Cataract 2018   small, bilateral   Depression    Hyperlipidemia    Hypertension    Rheumatoid arthritis ([place])    on meds    Past Surgical History:  Procedure Laterality Date   FINGER SURGERY  01/29/2017   last 2 fingers left hand   FOOT SURGERY  2016   Bil/ foot reconstruction due to RA   KNEE SURGERY Bilateral    Right knee-2010, Left knee -2006   TOTAL HIP ARTHROPLASTY Right 05/31/2018   Procedure: TOTAL HIP ARTHROPLASTY;   Surgeon: [name], MD;  Location: [facility];  Service: Orthopedics;  Laterality: Right;   VARICOSE VEIN SURGERY  2015   right     Current Outpatient Medications  Medication Sig Dispense Refill Last Dose   Calcium Carb-Cholecalciferol (CALCIUM + D3 PO) Take 1 tablet by mouth daily.      citalopram (CELEXA) 20 MG tablet TAKE 1 TABLET(20 MG) BY MOUTH DAILY 90 tablet 1    Ferrous Sulfate Dried (SLOW RELEASE IRON) 45 MG TBCR Take 1 tablet by mouth 2 (two) times daily. 60 tablet 0    lisinopril (ZESTRIL) 10 MG tablet TAKE 1 TABLET(10 MG) BY MOUTH DAILY 90 tablet 0    methotrexate 2.5 MG tablet Take 25 mg by mouth once a week. Thursdays      Multiple Vitamins-Minerals (CENTRUM SILVER ULTRA WOMENS PO) Take 1 tablet by mouth daily.       RESTASIS 0.05 % ophthalmic emulsion 1 drop 2 (two) times daily.      RINVOQ 15 MG TB24       No current facility-administered medications for this visit.   No Known Allergies  Social History   Tobacco Use   Smoking status: Never   Smokeless tobacco: Never  Substance Use Topics   Alcohol use: No    Alcohol/week: 0.0 standard drinks of alcohol    Family History  Problem Relation Age of Onset   Heart disease Mother        pacemaker, hypertensin     Review of Systems  Musculoskeletal:  Positive for arthralgias.  All other systems reviewed and are negative.   Objective:  Physical Exam Constitutional:      General: She is not in acute distress.    Appearance: Normal appearance.  HENT:     Head: Normocephalic and atraumatic.  Eyes:     Extraocular Movements: Extraocular movements intact.     Pupils: Pupils are equal, round, and reactive to light.  Cardiovascular:     Rate and Rhythm: Normal rate and regular rhythm.     Pulses: Normal pulses.     Heart sounds: Normal heart sounds.  Pulmonary:     Effort: Pulmonary effort is normal. No respiratory distress.     Breath sounds: Normal breath sounds. No wheezing.  Abdominal:     General:  Abdomen is flat. Bowel sounds are normal. There is no distension.     Palpations: Abdomen is soft.     Tenderness: There is no abdominal tenderness.  Musculoskeletal:     Cervical back: Normal range of motion and neck supple.     Comments: Examination of the left lower extremity shows she is neurovascularly intact.  Intact dorsiflexion and plantarflexion with the ankle.  No calf tenderness to palpation.  Limited range of motion and painful arc of motion with the left hip.  Very slow antalgic gait with the assistance of a single point cane.  Lymphadenopathy:     Cervical: No cervical adenopathy.  Skin:    General: Skin is warm and dry.     Findings: No erythema or rash.  Neurological:     General: No focal deficit present.     Mental Status: She is alert and oriented to person, place, and time.  Psychiatric:        Mood and Affect: Mood normal.        Behavior: Behavior normal.     Vital signs in last 24 hours: '@VSRANGES'$ @  Labs:   Estimated body mass index is 30.51 kg/m as calculated from the following:   Height as of 01/04/22: '4\' 9"'$  (1.448 m).   Weight as of 01/04/22: 64 kg.   Imaging Review Plain radiographs demonstrate moderate degenerative joint disease of the left hip(s). The bone quality appears to be good for age and reported activity level.      Assessment/Plan:  End stage arthritis, left hip(s)  The patient history, physical examination, clinical judgement of the provider and imaging studies are consistent with end stage degenerative joint disease of the left hip(s) and total hip arthroplasty is deemed medically necessary. The treatment options including medical management, injection therapy, arthroscopy and arthroplasty were discussed at length. The risks and benefits of total hip arthroplasty were presented and reviewed. The risks due to aseptic loosening, infection, stiffness, dislocation/subluxation,  thromboembolic complications and other imponderables were  discussed.  The patient acknowledged the explanation, agreed to proceed with the plan and consent was signed. Patient is being admitted for inpatient treatment for surgery, pain control, PT, OT, prophylactic antibiotics, VTE prophylaxis, progressive ambulation and ADL's and discharge planning.The patient is planning to be discharged home with home health services   Anticipated LOS equal to or greater than 2 midnights due to - Age 106 and older with one or more of the following:  - Obesity  - Expected need for hospital services (PT, OT, Nursing) required for safe  discharge  - Anticipated need for postoperative skilled nursing care or inpatient rehab  - Active co-morbidities: None OR   - Unanticipated findings during/Post Surgery: None  -  Patient is a high risk of re-admission due to: None

## 2022-03-09 NOTE — H&P (View-Only) (Signed)
TOTAL HIP ADMISSION H&P  Patient is admitted for left total hip arthroplasty.  Subjective:  Chief Complaint: left hip pain  HPI: Rachel Austin, 66 y.o. female, has a history of pain and functional disability in the left hip(s) due to arthritis and patient has failed non-surgical conservative treatments for greater than 12 weeks to include NSAID's and/or analgesics, use of assistive devices, and activity modification.  Onset of symptoms was gradual starting 5 years ago with gradually worsening course since that time.The patient noted no past surgery on the left hip(s).  Patient currently rates pain in the left hip at 8 out of 10 with activity. Patient has night pain, worsening of pain with activity and weight bearing, trendelenberg gait, pain that interfers with activities of daily living, and pain with passive range of motion. Patient has evidence of periarticular osteophytes and joint space narrowing by imaging studies. This condition presents safety issues increasing the risk of falls.  There is no current active infection.  Patient Active Problem List   Diagnosis Date Noted   Acute blood loss anemia 06/01/2018   Anemia, iron deficiency 06/01/2018   Degenerative joint disease of right hip 05/31/2018   Hyperlipidemia 03/01/2015   Essential hypertension 03/01/2015   Depression 03/01/2015   Rheumatoid arthritis ([place]) 07/16/2014   Past Medical History:  Diagnosis Date   Acute blood loss anemia 06/01/2018   Anemia    Anemia, iron deficiency 06/01/2018   Cataract 2018   small, bilateral   Depression    Hyperlipidemia    Hypertension    Rheumatoid arthritis ([place])    on meds    Past Surgical History:  Procedure Laterality Date   FINGER SURGERY  01/29/2017   last 2 fingers left hand   FOOT SURGERY  2016   Bil/ foot reconstruction due to RA   KNEE SURGERY Bilateral    Right knee-2010, Left knee -2006   TOTAL HIP ARTHROPLASTY Right 05/31/2018   Procedure: TOTAL HIP ARTHROPLASTY;   Surgeon: [name], MD;  Location: [facility];  Service: Orthopedics;  Laterality: Right;   VARICOSE VEIN SURGERY  2015   right     Current Outpatient Medications  Medication Sig Dispense Refill Last Dose   Calcium Carb-Cholecalciferol (CALCIUM + D3 PO) Take 1 tablet by mouth daily.      citalopram (CELEXA) 20 MG tablet TAKE 1 TABLET(20 MG) BY MOUTH DAILY 90 tablet 1    Ferrous Sulfate Dried (SLOW RELEASE IRON) 45 MG TBCR Take 1 tablet by mouth 2 (two) times daily. 60 tablet 0    lisinopril (ZESTRIL) 10 MG tablet TAKE 1 TABLET(10 MG) BY MOUTH DAILY 90 tablet 0    methotrexate 2.5 MG tablet Take 25 mg by mouth once a week. Thursdays      Multiple Vitamins-Minerals (CENTRUM SILVER ULTRA WOMENS PO) Take 1 tablet by mouth daily.       RESTASIS 0.05 % ophthalmic emulsion 1 drop 2 (two) times daily.      RINVOQ 15 MG TB24       No current facility-administered medications for this visit.   No Known Allergies  Social History   Tobacco Use   Smoking status: Never   Smokeless tobacco: Never  Substance Use Topics   Alcohol use: No    Alcohol/week: 0.0 standard drinks of alcohol    Family History  Problem Relation Age of Onset   Heart disease Mother        pacemaker, hypertensin     Review of Systems  Musculoskeletal:  Positive for arthralgias.  All other systems reviewed and are negative.   Objective:  Physical Exam Constitutional:      General: She is not in acute distress.    Appearance: Normal appearance.  HENT:     Head: Normocephalic and atraumatic.  Eyes:     Extraocular Movements: Extraocular movements intact.     Pupils: Pupils are equal, round, and reactive to light.  Cardiovascular:     Rate and Rhythm: Normal rate and regular rhythm.     Pulses: Normal pulses.     Heart sounds: Normal heart sounds.  Pulmonary:     Effort: Pulmonary effort is normal. No respiratory distress.     Breath sounds: Normal breath sounds. No wheezing.  Abdominal:     General:  Abdomen is flat. Bowel sounds are normal. There is no distension.     Palpations: Abdomen is soft.     Tenderness: There is no abdominal tenderness.  Musculoskeletal:     Cervical back: Normal range of motion and neck supple.     Comments: Examination of the left lower extremity shows she is neurovascularly intact.  Intact dorsiflexion and plantarflexion with the ankle.  No calf tenderness to palpation.  Limited range of motion and painful arc of motion with the left hip.  Very slow antalgic gait with the assistance of a single point cane.  Lymphadenopathy:     Cervical: No cervical adenopathy.  Skin:    General: Skin is warm and dry.     Findings: No erythema or rash.  Neurological:     General: No focal deficit present.     Mental Status: She is alert and oriented to person, place, and time.  Psychiatric:        Mood and Affect: Mood normal.        Behavior: Behavior normal.     Vital signs in last 24 hours: '@VSRANGES'$ @  Labs:   Estimated body mass index is 30.51 kg/m as calculated from the following:   Height as of 01/04/22: '4\' 9"'$  (1.448 m).   Weight as of 01/04/22: 64 kg.   Imaging Review Plain radiographs demonstrate moderate degenerative joint disease of the left hip(s). The bone quality appears to be good for age and reported activity level.      Assessment/Plan:  End stage arthritis, left hip(s)  The patient history, physical examination, clinical judgement of the provider and imaging studies are consistent with end stage degenerative joint disease of the left hip(s) and total hip arthroplasty is deemed medically necessary. The treatment options including medical management, injection therapy, arthroscopy and arthroplasty were discussed at length. The risks and benefits of total hip arthroplasty were presented and reviewed. The risks due to aseptic loosening, infection, stiffness, dislocation/subluxation,  thromboembolic complications and other imponderables were  discussed.  The patient acknowledged the explanation, agreed to proceed with the plan and consent was signed. Patient is being admitted for inpatient treatment for surgery, pain control, PT, OT, prophylactic antibiotics, VTE prophylaxis, progressive ambulation and ADL's and discharge planning.The patient is planning to be discharged home with home health services   Anticipated LOS equal to or greater than 2 midnights due to - Age 68 and older with one or more of the following:  - Obesity  - Expected need for hospital services (PT, OT, Nursing) required for safe  discharge  - Anticipated need for postoperative skilled nursing care or inpatient rehab  - Active co-morbidities: None OR   - Unanticipated findings during/Post Surgery: None  -  Patient is a high risk of re-admission due to: None

## 2022-03-21 ENCOUNTER — Encounter (HOSPITAL_COMMUNITY)
Admission: RE | Admit: 2022-03-21 | Discharge: 2022-03-21 | Disposition: A | Discharge status: Home / Self Care | Payer: Medicare Other | Source: Ambulatory Visit | Attending: Orthopedic Surgery | Admitting: Orthopedic Surgery

## 2022-03-21 ENCOUNTER — Other Ambulatory Visit: Payer: Self-pay

## 2022-03-21 ENCOUNTER — Encounter (HOSPITAL_COMMUNITY): Payer: Self-pay

## 2022-03-21 VITALS — BP 145/81 | HR 55 | Temp 98.2°F | Ht <= 58 in | Wt 136.0 lb

## 2022-03-21 DIAGNOSIS — M25552 Pain in left hip: Secondary | ICD-10-CM | POA: Diagnosis not present

## 2022-03-21 DIAGNOSIS — Z01818 Encounter for other preprocedural examination: Secondary | ICD-10-CM

## 2022-03-21 DIAGNOSIS — Z01812 Encounter for preprocedural laboratory examination: Secondary | ICD-10-CM | POA: Insufficient documentation

## 2022-03-21 DIAGNOSIS — G8929 Other chronic pain: Secondary | ICD-10-CM

## 2022-03-21 HISTORY — DX: Syncope and collapse: R55

## 2022-03-21 LAB — COMPREHENSIVE METABOLIC PANEL
ALT: 17 U/L (ref 0–44)
AST: 27 U/L (ref 15–41)
Albumin: 4.7 g/dL (ref 3.5–5.0)
Alkaline Phosphatase: 47 U/L (ref 38–126)
Anion gap: 6 (ref 5–15)
BUN: 18 mg/dL (ref 8–23)
CO2: 24 mmol/L (ref 22–32)
Calcium: 10 mg/dL (ref 8.9–10.3)
Chloride: 110 mmol/L (ref 98–111)
Creatinine, Ser: 0.53 mg/dL (ref 0.44–1.00)
GFR, Estimated: >60 mL/min (ref >60)
Glucose, Bld: 89 mg/dL (ref 70–99)
Potassium: 4.3 mmol/L (ref 3.5–5.1)
Sodium: 140 mmol/L (ref 135–145)
Total Bilirubin: 0.7 mg/dL (ref 0.3–1.2)
Total Protein: 8.2 g/dL — ABNORMAL HIGH (ref 6.5–8.1)

## 2022-03-21 LAB — CBC WITH DIFFERENTIAL/PLATELET
Abs Immature Granulocytes: 0 10*3/uL (ref 0.00–0.07)
Basophils Absolute: 0 10*3/uL (ref 0.0–0.1)
Basophils Relative: 1 %
Eosinophils Absolute: 0.1 10*3/uL (ref 0.0–0.5)
Eosinophils Relative: 1 %
HCT: 40.2 % (ref 36.0–46.0)
Hemoglobin: 13 g/dL (ref 12.0–15.0)
Immature Granulocytes: 0 %
Lymphocytes Relative: 59 %
Lymphs Abs: 3.2 10*3/uL (ref 0.7–4.0)
MCH: 31.6 pg (ref 26.0–34.0)
MCHC: 32.3 g/dL (ref 30.0–36.0)
MCV: 97.6 fL (ref 80.0–100.0)
Monocytes Absolute: 0.3 10*3/uL (ref 0.1–1.0)
Monocytes Relative: 6 %
Neutro Abs: 1.8 10*3/uL (ref 1.7–7.7)
Neutrophils Relative %: 33 %
Platelets: 270 10*3/uL (ref 150–400)
RBC: 4.12 MIL/uL (ref 3.87–5.11)
RDW: 12.8 % (ref 11.5–15.5)
WBC: 5.4 10*3/uL (ref 4.0–10.5)
nRBC: 0 % (ref 0.0–0.2)

## 2022-03-21 LAB — SURGICAL PCR SCREEN
MRSA, PCR: NEGATIVE
Staphylococcus aureus: NEGATIVE

## 2022-03-21 NOTE — Progress Notes (Signed)
For Short Stay: [facility] appointment date: Date of COVID positive in last 67 days:  Bowel Prep reminder:   For Anesthesia: PCP - Dorothyann Peng: NP Cardiologist: Dr. Isidoro Donning: PAC: [place]: 01/04/22: EPIC Chest x-ray -  EKG - 01/04/22 Stress Test -  ECHO -  Cardiac Cath -  Pacemaker/ICD device last checked: Pacemaker orders received: Device Rep notified:  Spinal Cord Stimulator:  Sleep Study -  CPAP -   Fasting Blood Sugar -  Checks Blood Sugar _____ times a day Date and result of last Hgb A1c-  Blood Thinner Instructions: Aspirin Instructions: Last Dose:  Activity level: Can go up a flight of stairs and activities of daily living without stopping and without chest pain and/or shortness of breath   Able to exercise without chest pain and/or shortness of breath   Unable to go up a flight of stairs without chest pain and/or shortness of breath     Anesthesia review: Hx: HTN,S[place].  Patient denies shortness of breath, fever, cough and chest pain at PAT appointment   Patient verbalized understanding of instructions that were given to them at the PAT appointment. Patient was also instructed that they will need to review over the PAT instructions again at home before surgery.

## 2022-03-22 NOTE — Care Plan (Signed)
Ortho Bundle Case Management Note  Patient Details  Name: Rachel Austin MRN: [id number] Date of Birth: 1956/02/21  met with patient and daughter in the office for H&P. patient speaks no English She will discharge to home with her assistance. has rolling walker for home. HHPT referral to [facility] and OPPT set up with [place]. discharge instructions discussed and questions answered. appointments confirmed. Patient and MD in agreement. Choice offered                     DME Arranged:    DME Agency:     HH Arranged:  PT HH Agency:  [place]  Additional Comments: Please contact me with any questions of if this plan should need to change.  Ladell Heads,  [place] [facility] Specialist  [phone] 03/22/2022, 1:38 PM

## 2022-03-30 MED ORDER — TRANEXAMIC ACID 1000 MG/10ML IV SOLN
2000.0000 mg | INTRAVENOUS | Status: DC
  Filled 2022-03-30: qty 20

## 2022-03-31 ENCOUNTER — Ambulatory Visit (HOSPITAL_COMMUNITY): Payer: Medicare Other

## 2022-03-31 ENCOUNTER — Other Ambulatory Visit: Payer: Self-pay

## 2022-03-31 ENCOUNTER — Inpatient Hospital Stay (HOSPITAL_COMMUNITY)
Admission: AD | Admit: 2022-03-31 | Discharge: 2022-04-03 | DRG: 470 | Disposition: A | Discharge status: Home Health | Payer: Medicare Other | Attending: Orthopedic Surgery | Admitting: Orthopedic Surgery

## 2022-03-31 ENCOUNTER — Encounter (HOSPITAL_COMMUNITY): Payer: Self-pay | Admitting: Orthopedic Surgery

## 2022-03-31 ENCOUNTER — Ambulatory Visit (HOSPITAL_COMMUNITY): Payer: Medicare Other | Admitting: Certified Registered Nurse Anesthetist

## 2022-03-31 ENCOUNTER — Encounter (HOSPITAL_COMMUNITY)
Admission: AD | Disposition: A | Discharge status: Home Health | Payer: Self-pay | Source: Home / Self Care | Attending: Orthopedic Surgery

## 2022-03-31 ENCOUNTER — Ambulatory Visit (HOSPITAL_BASED_OUTPATIENT_CLINIC_OR_DEPARTMENT_OTHER): Payer: Medicare Other | Admitting: Certified Registered Nurse Anesthetist

## 2022-03-31 DIAGNOSIS — M069 Rheumatoid arthritis, unspecified: Secondary | ICD-10-CM | POA: Diagnosis not present

## 2022-03-31 DIAGNOSIS — F3289 Other specified depressive episodes: Secondary | ICD-10-CM

## 2022-03-31 DIAGNOSIS — Z8249 Family history of ischemic heart disease and other diseases of the circulatory system: Secondary | ICD-10-CM

## 2022-03-31 DIAGNOSIS — E785 Hyperlipidemia, unspecified: Secondary | ICD-10-CM | POA: Diagnosis not present

## 2022-03-31 DIAGNOSIS — M1612 Unilateral primary osteoarthritis, left hip: Secondary | ICD-10-CM | POA: Diagnosis not present

## 2022-03-31 DIAGNOSIS — D62 Acute posthemorrhagic anemia: Secondary | ICD-10-CM | POA: Diagnosis not present

## 2022-03-31 DIAGNOSIS — E669 Obesity, unspecified: Secondary | ICD-10-CM | POA: Diagnosis present

## 2022-03-31 DIAGNOSIS — Z6830 Body mass index (BMI) 30.0-30.9, adult: Secondary | ICD-10-CM | POA: Diagnosis not present

## 2022-03-31 DIAGNOSIS — Z79899 Other long term (current) drug therapy: Secondary | ICD-10-CM

## 2022-03-31 DIAGNOSIS — Z471 Aftercare following joint replacement surgery: Secondary | ICD-10-CM | POA: Diagnosis not present

## 2022-03-31 DIAGNOSIS — Z96642 Presence of left artificial hip joint: Secondary | ICD-10-CM | POA: Diagnosis not present

## 2022-03-31 DIAGNOSIS — I10 Essential (primary) hypertension: Secondary | ICD-10-CM | POA: Diagnosis not present

## 2022-03-31 DIAGNOSIS — Z96641 Presence of right artificial hip joint: Secondary | ICD-10-CM | POA: Diagnosis present

## 2022-03-31 HISTORY — PX: TOTAL HIP ARTHROPLASTY: SHX124

## 2022-03-31 SURGERY — ARTHROPLASTY, HIP, TOTAL,POSTERIOR APPROACH
Anesthesia: Spinal | Site: Hip | Laterality: Left

## 2022-03-31 MED ORDER — DEXMEDETOMIDINE HCL IN NACL 80 MCG/20ML IV SOLN
INTRAVENOUS | Status: DC | PRN
  Administered 2022-03-31 (×2): 4 ug via BUCCAL

## 2022-03-31 MED ORDER — BUPIVACAINE LIPOSOME 1.3 % IJ SUSP
INTRAMUSCULAR | Status: DC | PRN
  Administered 2022-03-31: 20 mL

## 2022-03-31 MED ORDER — WATER FOR IRRIGATION, STERILE IR SOLN
Status: DC | PRN
  Administered 2022-03-31: 2000 mL

## 2022-03-31 MED ORDER — BUPIVACAINE LIPOSOME 1.3 % IJ SUSP
INTRAMUSCULAR | Status: AC
  Filled 2022-03-31: qty 20

## 2022-03-31 MED ORDER — HYDROMORPHONE HCL 1 MG/ML IJ SOLN
INTRAMUSCULAR | Status: AC
  Filled 2022-03-31: qty 1

## 2022-03-31 MED ORDER — ACETAMINOPHEN 500 MG PO TABS
1000.0000 mg | ORAL_TABLET | Freq: Four times a day (QID) | ORAL | Status: AC
  Administered 2022-03-31 – 2022-04-01 (×4): 1000 mg via ORAL
  Filled 2022-03-31 (×4): qty 2

## 2022-03-31 MED ORDER — MENTHOL 3 MG MT LOZG: 1.0000 | LOZENGE | OROMUCOSAL | Status: DC | PRN

## 2022-03-31 MED ORDER — DEXMEDETOMIDINE HCL IN NACL 80 MCG/20ML IV SOLN
INTRAVENOUS | Status: AC
  Filled 2022-03-31: qty 20

## 2022-03-31 MED ORDER — OXYCODONE HCL 5 MG PO TABS: ORAL_TABLET | ORAL | 0 refills | Status: DC

## 2022-03-31 MED ORDER — SODIUM CHLORIDE (PF) 0.9 % IJ SOLN
INTRAMUSCULAR | Status: AC
  Filled 2022-03-31: qty 20

## 2022-03-31 MED ORDER — ALBUMIN HUMAN 5 % IV SOLN: INTRAVENOUS | Status: DC | PRN

## 2022-03-31 MED ORDER — DIPHENHYDRAMINE HCL 12.5 MG/5ML PO ELIX: 12.5000 mg | ORAL_SOLUTION | ORAL | Status: DC | PRN

## 2022-03-31 MED ORDER — SODIUM CHLORIDE (PF) 0.9 % IJ SOLN
INTRAMUSCULAR | Status: DC | PRN
  Administered 2022-03-31: 20 mL via INTRAVENOUS

## 2022-03-31 MED ORDER — LACTATED RINGERS IV SOLN: INTRAVENOUS | Status: DC

## 2022-03-31 MED ORDER — ACETAMINOPHEN 10 MG/ML IV SOLN: 1000.0000 mg | Freq: Once | INTRAVENOUS | Status: DC | PRN

## 2022-03-31 MED ORDER — PHENYLEPHRINE 80 MCG/ML (10ML) SYRINGE FOR IV PUSH (FOR BLOOD PRESSURE SUPPORT)
PREFILLED_SYRINGE | INTRAVENOUS | Status: AC
  Filled 2022-03-31: qty 10

## 2022-03-31 MED ORDER — HYDROMORPHONE HCL 1 MG/ML IJ SOLN
0.2500 mg | INTRAMUSCULAR | Status: DC | PRN
  Administered 2022-03-31 (×2): 0.5 mg via INTRAVENOUS

## 2022-03-31 MED ORDER — LISINOPRIL 10 MG PO TABS
10.0000 mg | ORAL_TABLET | Freq: Every day | ORAL | Status: DC
  Administered 2022-04-02: 10 mg via ORAL
  Filled 2022-03-31 (×2): qty 1

## 2022-03-31 MED ORDER — EPHEDRINE 5 MG/ML INJ
INTRAVENOUS | Status: AC
  Filled 2022-03-31: qty 5

## 2022-03-31 MED ORDER — AMISULPRIDE (ANTIEMETIC) 5 MG/2ML IV SOLN: 10.0000 mg | Freq: Once | INTRAVENOUS | Status: DC | PRN

## 2022-03-31 MED ORDER — OXYCODONE HCL 5 MG PO TABS
5.0000 mg | ORAL_TABLET | ORAL | Status: DC | PRN
  Administered 2022-03-31: 5 mg via ORAL
  Administered 2022-03-31 – 2022-04-01 (×3): 10 mg via ORAL
  Filled 2022-03-31: qty 1
  Filled 2022-03-31 (×4): qty 2

## 2022-03-31 MED ORDER — ACETAMINOPHEN 325 MG PO TABS: 325.0000 mg | ORAL_TABLET | Freq: Once | ORAL | Status: DC | PRN

## 2022-03-31 MED ORDER — CITALOPRAM HYDROBROMIDE 20 MG PO TABS
20.0000 mg | ORAL_TABLET | Freq: Every day | ORAL | Status: DC
  Administered 2022-04-01 – 2022-04-03 (×2): 20 mg via ORAL
  Filled 2022-03-31 (×3): qty 1

## 2022-03-31 MED ORDER — ORAL CARE MOUTH RINSE: 15.0000 mL | Freq: Once | OROMUCOSAL | Status: AC

## 2022-03-31 MED ORDER — CHLORHEXIDINE GLUCONATE 0.12 % MT SOLN
15.0000 mL | Freq: Once | OROMUCOSAL | Status: AC
  Administered 2022-03-31: 15 mL via OROMUCOSAL

## 2022-03-31 MED ORDER — CEFAZOLIN SODIUM-DEXTROSE 1-4 GM/50ML-% IV SOLN
1.0000 g | Freq: Four times a day (QID) | INTRAVENOUS | Status: AC
  Administered 2022-03-31 (×2): 1 g via INTRAVENOUS
  Filled 2022-03-31 (×2): qty 50

## 2022-03-31 MED ORDER — FLEET ENEMA 7-19 GM/118ML RE ENEM: 1.0000 | ENEMA | Freq: Once | RECTAL | Status: DC | PRN

## 2022-03-31 MED ORDER — ALBUMIN HUMAN 5 % IV SOLN
INTRAVENOUS | Status: AC
  Filled 2022-03-31: qty 250

## 2022-03-31 MED ORDER — DOCUSATE SODIUM 100 MG PO CAPS
100.0000 mg | ORAL_CAPSULE | Freq: Two times a day (BID) | ORAL | Status: DC
  Administered 2022-03-31 – 2022-04-03 (×6): 100 mg via ORAL
  Filled 2022-03-31 (×6): qty 1

## 2022-03-31 MED ORDER — CEFAZOLIN SODIUM-DEXTROSE 2-4 GM/100ML-% IV SOLN
2.0000 g | INTRAVENOUS | Status: AC
  Administered 2022-03-31: 2 g via INTRAVENOUS
  Filled 2022-03-31: qty 100

## 2022-03-31 MED ORDER — SODIUM CHLORIDE 0.9 % IV SOLN: INTRAVENOUS | Status: DC

## 2022-03-31 MED ORDER — ASPIRIN 81 MG PO TBEC: 81.0000 mg | DELAYED_RELEASE_TABLET | Freq: Two times a day (BID) | ORAL | 0 refills | Status: AC

## 2022-03-31 MED ORDER — SODIUM CHLORIDE 0.9 % IR SOLN
Status: DC | PRN
  Administered 2022-03-31: 1000 mL

## 2022-03-31 MED ORDER — ACETAMINOPHEN 325 MG PO TABS
325.0000 mg | ORAL_TABLET | Freq: Four times a day (QID) | ORAL | Status: DC | PRN
  Administered 2022-04-01 – 2022-04-02 (×2): 650 mg via ORAL
  Filled 2022-03-31 (×2): qty 2

## 2022-03-31 MED ORDER — ASPIRIN 81 MG PO CHEW
81.0000 mg | CHEWABLE_TABLET | Freq: Two times a day (BID) | ORAL | Status: DC
  Administered 2022-03-31 – 2022-04-03 (×6): 81 mg via ORAL
  Filled 2022-03-31 (×6): qty 1

## 2022-03-31 MED ORDER — POVIDONE-IODINE 10 % EX SWAB: 2.0000 | Freq: Once | CUTANEOUS | Status: DC

## 2022-03-31 MED ORDER — LIDOCAINE HCL (PF) 2 % IJ SOLN
INTRAMUSCULAR | Status: AC
  Filled 2022-03-31: qty 15

## 2022-03-31 MED ORDER — GLYCOPYRROLATE 0.2 MG/ML IJ SOLN
INTRAMUSCULAR | Status: DC | PRN
  Administered 2022-03-31: .2 mg via INTRAVENOUS

## 2022-03-31 MED ORDER — CELECOXIB 200 MG PO CAPS: 200.0000 mg | ORAL_CAPSULE | Freq: Two times a day (BID) | ORAL | 0 refills | Status: AC

## 2022-03-31 MED ORDER — BUPIVACAINE IN DEXTROSE 0.75-8.25 % IT SOLN
INTRATHECAL | Status: DC | PRN
  Administered 2022-03-31: 1.6 mL via INTRATHECAL

## 2022-03-31 MED ORDER — ACETAMINOPHEN 500 MG PO TABS
1000.0000 mg | ORAL_TABLET | Freq: Once | ORAL | Status: AC
  Administered 2022-03-31: 1000 mg via ORAL
  Filled 2022-03-31: qty 2

## 2022-03-31 MED ORDER — POLYETHYLENE GLYCOL 3350 17 G PO PACK: 17.0000 g | PACK | Freq: Every day | ORAL | Status: DC | PRN

## 2022-03-31 MED ORDER — BUPIVACAINE-EPINEPHRINE (PF) 0.5% -1:200000 IJ SOLN
INTRAMUSCULAR | Status: AC
  Filled 2022-03-31: qty 30

## 2022-03-31 MED ORDER — MIDAZOLAM HCL 2 MG/2ML IJ SOLN
INTRAMUSCULAR | Status: DC | PRN
  Administered 2022-03-31: 2 mg via INTRAVENOUS

## 2022-03-31 MED ORDER — BUPIVACAINE LIPOSOME 1.3 % IJ SUSP: 10.0000 mL | Freq: Once | INTRAMUSCULAR | Status: DC

## 2022-03-31 MED ORDER — DEXAMETHASONE SODIUM PHOSPHATE 10 MG/ML IJ SOLN
INTRAMUSCULAR | Status: DC | PRN
  Administered 2022-03-31: 5 mg via INTRAVENOUS

## 2022-03-31 MED ORDER — BUPIVACAINE-EPINEPHRINE 0.5% -1:200000 IJ SOLN
INTRAMUSCULAR | Status: DC | PRN
  Administered 2022-03-31: 30 mL

## 2022-03-31 MED ORDER — ONDANSETRON HCL 4 MG/2ML IJ SOLN: 4.0000 mg | Freq: Four times a day (QID) | INTRAMUSCULAR | Status: DC | PRN

## 2022-03-31 MED ORDER — FENTANYL CITRATE (PF) 100 MCG/2ML IJ SOLN
INTRAMUSCULAR | Status: AC
  Filled 2022-03-31: qty 2

## 2022-03-31 MED ORDER — DEXAMETHASONE SODIUM PHOSPHATE 10 MG/ML IJ SOLN
INTRAMUSCULAR | Status: AC
  Filled 2022-03-31: qty 3

## 2022-03-31 MED ORDER — FENTANYL CITRATE (PF) 100 MCG/2ML IJ SOLN
INTRAMUSCULAR | Status: DC | PRN
  Administered 2022-03-31: 25 ug via INTRAVENOUS
  Administered 2022-03-31: 50 ug via INTRAVENOUS
  Administered 2022-03-31: 25 ug via INTRAVENOUS

## 2022-03-31 MED ORDER — PROPOFOL 500 MG/50ML IV EMUL
INTRAVENOUS | Status: DC | PRN
  Administered 2022-03-31: 40 ug/kg/min via INTRAVENOUS
  Administered 2022-03-31: 40 mg via INTRAVENOUS

## 2022-03-31 MED ORDER — TRANEXAMIC ACID-NACL 1000-0.7 MG/100ML-% IV SOLN
1000.0000 mg | Freq: Once | INTRAVENOUS | Status: AC
  Administered 2022-03-31: 1000 mg via INTRAVENOUS
  Filled 2022-03-31: qty 100

## 2022-03-31 MED ORDER — CYCLOSPORINE 0.05 % OP EMUL
1.0000 [drp] | Freq: Two times a day (BID) | OPHTHALMIC | Status: DC
  Administered 2022-03-31 – 2022-04-03 (×4): 1 [drp] via OPHTHALMIC
  Filled 2022-03-31 (×7): qty 30

## 2022-03-31 MED ORDER — ARTIFICIAL TEARS OPHTHALMIC OINT
1.0000 | TOPICAL_OINTMENT | Freq: Every day | OPHTHALMIC | Status: DC
  Administered 2022-03-31 – 2022-04-01 (×2): 1 via OPHTHALMIC
  Filled 2022-03-31: qty 3.5

## 2022-03-31 MED ORDER — LIDOCAINE 2% (20 MG/ML) 5 ML SYRINGE
INTRAMUSCULAR | Status: DC | PRN
  Administered 2022-03-31: 80 mg via INTRAVENOUS

## 2022-03-31 MED ORDER — SORBITOL 70 % SOLN: 30.0000 mL | Freq: Every day | Status: DC | PRN

## 2022-03-31 MED ORDER — ONDANSETRON HCL 4 MG PO TABS
4.0000 mg | ORAL_TABLET | Freq: Four times a day (QID) | ORAL | Status: DC | PRN
  Administered 2022-04-02: 4 mg via ORAL
  Filled 2022-03-31: qty 1

## 2022-03-31 MED ORDER — METOCLOPRAMIDE HCL 5 MG/ML IJ SOLN: 5.0000 mg | Freq: Three times a day (TID) | INTRAMUSCULAR | Status: DC | PRN

## 2022-03-31 MED ORDER — ACETAMINOPHEN 500 MG PO TABS: 1000.0000 mg | ORAL_TABLET | Freq: Four times a day (QID) | ORAL | 2 refills | Status: AC | PRN

## 2022-03-31 MED ORDER — PROMETHAZINE HCL 25 MG/ML IJ SOLN: 6.2500 mg | INTRAMUSCULAR | Status: DC | PRN

## 2022-03-31 MED ORDER — HYDROMORPHONE HCL 1 MG/ML IJ SOLN
0.5000 mg | INTRAMUSCULAR | Status: DC | PRN
  Administered 2022-03-31: 1 mg via INTRAVENOUS
  Filled 2022-03-31: qty 1

## 2022-03-31 MED ORDER — PHENYLEPHRINE 80 MCG/ML (10ML) SYRINGE FOR IV PUSH (FOR BLOOD PRESSURE SUPPORT)
PREFILLED_SYRINGE | INTRAVENOUS | Status: DC | PRN
  Administered 2022-03-31: 80 ug via INTRAVENOUS
  Administered 2022-03-31: 160 ug via INTRAVENOUS
  Administered 2022-03-31: 80 ug via INTRAVENOUS
  Administered 2022-03-31: 160 ug via INTRAVENOUS
  Administered 2022-03-31: 120 ug via INTRAVENOUS
  Administered 2022-03-31 (×3): 80 ug via INTRAVENOUS
  Administered 2022-03-31: 160 ug via INTRAVENOUS

## 2022-03-31 MED ORDER — MIDAZOLAM HCL 2 MG/2ML IJ SOLN
INTRAMUSCULAR | Status: AC
  Filled 2022-03-31: qty 2

## 2022-03-31 MED ORDER — ACETAMINOPHEN 160 MG/5ML PO SOLN: 325.0000 mg | Freq: Once | ORAL | Status: DC | PRN

## 2022-03-31 MED ORDER — TRANEXAMIC ACID-NACL 1000-0.7 MG/100ML-% IV SOLN
1000.0000 mg | INTRAVENOUS | Status: AC
  Administered 2022-03-31: 1000 mg via INTRAVENOUS
  Filled 2022-03-31: qty 100

## 2022-03-31 MED ORDER — ONDANSETRON HCL 4 MG/2ML IJ SOLN
INTRAMUSCULAR | Status: AC
  Filled 2022-03-31: qty 6

## 2022-03-31 MED ORDER — FERROUS SULFATE 325 (65 FE) MG PO TABS
325.0000 mg | ORAL_TABLET | Freq: Every day | ORAL | Status: DC
  Administered 2022-04-01 – 2022-04-03 (×3): 325 mg via ORAL
  Filled 2022-03-31 (×3): qty 1

## 2022-03-31 MED ORDER — PHENOL 1.4 % MT LIQD: 1.0000 | OROMUCOSAL | Status: DC | PRN

## 2022-03-31 MED ORDER — METOCLOPRAMIDE HCL 5 MG PO TABS: 5.0000 mg | ORAL_TABLET | Freq: Three times a day (TID) | ORAL | Status: DC | PRN

## 2022-03-31 SURGICAL SUPPLY — 53 items
ACETAB SHELL 46 (Head) ×1 IMPLANT
BAG COUNTER SPONGE SURGICOUNT (BAG) IMPLANT
BAG DECANTER FOR FLEXI CONT (MISCELLANEOUS) ×1 IMPLANT
BAG ZIPLOCK 12X15 (MISCELLANEOUS) ×1 IMPLANT
BENZOIN TINCTURE PRP APPL 2/3 (GAUZE/BANDAGES/DRESSINGS) ×1 IMPLANT
BLADE SAW SGTL 73X25 THK (BLADE) ×1 IMPLANT
CLSR STERI-STRIP ANTIMIC 1/2X4 (GAUZE/BANDAGES/DRESSINGS) ×1 IMPLANT
COVER SURGICAL LIGHT HANDLE (MISCELLANEOUS) ×1 IMPLANT
DRAPE INCISE IOBAN 66X45 STRL (DRAPES) ×1 IMPLANT
DRAPE ORTHO SPLIT 77X108 STRL (DRAPES) ×2
DRAPE POUCH INSTRU U-SHP 10X18 (DRAPES) ×1 IMPLANT
DRAPE SHEET LG 3/4 BI-LAMINATE (DRAPES) IMPLANT
DRAPE SURG ORHT 6 SPLT 77X108 (DRAPES) ×2 IMPLANT
DRAPE U-SHAPE 47X51 STRL (DRAPES) ×1 IMPLANT
DRESSING AQUACEL AG SP 3.5X10 (GAUZE/BANDAGES/DRESSINGS) ×1 IMPLANT
DRSG AQUACEL AG ADV 3.5X10 (GAUZE/BANDAGES/DRESSINGS) IMPLANT
DRSG AQUACEL AG SP 3.5X10 (GAUZE/BANDAGES/DRESSINGS) ×1
DURAPREP 26ML APPLICATOR (WOUND CARE) ×1 IMPLANT
ELECT BLADE TIP CTD 4 INCH (ELECTRODE) ×1 IMPLANT
ELECT REM PT RETURN 15FT ADLT (MISCELLANEOUS) ×1 IMPLANT
FACESHIELD WRAPAROUND (MASK) ×3 IMPLANT
FACESHIELD WRAPAROUND OR TEAM (MASK) ×3 IMPLANT
GLOVE BIOGEL PI IND STRL 8 (GLOVE) ×2 IMPLANT
GLOVE SURG ORTHO 8.0 STRL STRW (GLOVE) ×1 IMPLANT
GLOVE SURG POLYISO LF SZ7.5 (GLOVE) ×1 IMPLANT
GLOVE SURG SS PI 7.5 STRL IVOR (GLOVE) ×1 IMPLANT
GOWN STRL REUS W/ TWL XL LVL3 (GOWN DISPOSABLE) ×2 IMPLANT
GOWN STRL REUS W/TWL XL LVL3 (GOWN DISPOSABLE) ×2
HEAD DELTA 28MM 12/14 P8.5 HIP (Head) IMPLANT
HOLDER FOLEY CATH W/STRAP (MISCELLANEOUS) ×1 IMPLANT
HOOD PEEL AWAY T7 (MISCELLANEOUS) ×1 IMPLANT
KIT BASIN OR (CUSTOM PROCEDURE TRAY) ×1 IMPLANT
KIT TURNOVER KIT A (KITS) IMPLANT
LINER ACET ALTRX +4 28X46 0D (Hips) IMPLANT
MANIFOLD NEPTUNE II (INSTRUMENTS) ×1 IMPLANT
NEEDLE HYPO 22GX1.5 SAFETY (NEEDLE) ×2 IMPLANT
NS IRRIG 1000ML POUR BTL (IV SOLUTION) ×1 IMPLANT
PACK TOTAL JOINT (CUSTOM PROCEDURE TRAY) ×1 IMPLANT
PROTECTOR NERVE ULNAR (MISCELLANEOUS) ×1 IMPLANT
SHELL ACETAB 46 (Head) IMPLANT
SPIKE FLUID TRANSFER (MISCELLANEOUS) ×3 IMPLANT
STEM FEM ACTIS HIGH SZ3 (Stem) IMPLANT
SUCTION FRAZIER HANDLE 12FR (TUBING) ×1
SUCTION TUBE FRAZIER 12FR DISP (TUBING) IMPLANT
SUT ETHIBOND NAB CT1 #1 30IN (SUTURE) ×4 IMPLANT
SUT MNCRL AB 3-0 PS2 18 (SUTURE) ×1 IMPLANT
SUT VIC AB 0 CT1 36 (SUTURE) ×2 IMPLANT
SUT VIC AB 2-0 CT1 27 (SUTURE) ×2
SUT VIC AB 2-0 CT1 TAPERPNT 27 (SUTURE) ×2 IMPLANT
SYR CONTROL 10ML LL (SYRINGE) ×2 IMPLANT
TOWEL OR 17X26 10 PK STRL BLUE (TOWEL DISPOSABLE) ×1 IMPLANT
TRAY FOLEY MTR SLVR 16FR STAT (SET/KITS/TRAYS/PACK) ×1 IMPLANT
WATER STERILE IRR 1000ML POUR (IV SOLUTION) ×2 IMPLANT

## 2022-03-31 NOTE — Anesthesia Preprocedure Evaluation (Signed)
Anesthesia Evaluation  Patient identified by MRN, date of birth, ID band Patient awake    Reviewed: Allergy & Precautions, NPO status , Patient's Chart, lab work & pertinent test results  Airway Mallampati: II  TM Distance: >3 FB Neck ROM: Full    Dental  (+) Missing, Dental Advisory Given   Pulmonary neg pulmonary ROS   breath sounds clear to auscultation       Cardiovascular hypertension,  Rhythm:Regular Rate:Normal     Neuro/Psych  PSYCHIATRIC DISORDERS  Depression    negative neurological ROS     GI/Hepatic negative GI ROS, Neg liver ROS,,,  Endo/Other  negative endocrine ROS    Renal/GU negative Renal ROS     Musculoskeletal  (+) Arthritis , Rheumatoid disorders,    Abdominal Normal abdominal exam  (+)   Peds  Hematology negative hematology ROS (+)   Anesthesia Other Findings   Reproductive/Obstetrics                              Anesthesia Physical Anesthesia Plan  ASA: 2  Anesthesia Plan: Spinal   Post-op Pain Management:    Induction: Intravenous  PONV Risk Score and Plan: 3 and Ondansetron, Propofol infusion and Midazolam  Airway Management Planned: Natural Airway and Simple Face Mask  Additional Equipment: None  Intra-op Plan:   Post-operative Plan:   Informed Consent: I have reviewed the patients History and Physical, chart, labs and discussed the procedure including the risks, benefits and alternatives for the proposed anesthesia with the patient or authorized representative who has indicated his/her understanding and acceptance.     Dental advisory given and Interpreter used for interveiw  Plan Discussed with: CRNA  Anesthesia Plan Comments: (Lab Results      Component                Value               Date                      WBC                      5.4                 03/21/2022                HGB                      13.0                03/21/2022                 HCT                      40.2                03/21/2022                MCV                      97.6                03/21/2022                PLT  270                 03/21/2022           )         Anesthesia Quick Evaluation

## 2022-03-31 NOTE — Transfer of Care (Signed)
Immediate Anesthesia Transfer of Care Note  Patient: Rachel Austin  Procedure(s) Performed: Procedure(s): TOTAL HIP ARTHROPLASTY (Left)  Patient Location: PACU  Anesthesia Type:Spinal  Level of Consciousness: awake, alert  and oriented  Airway & Oxygen Therapy: Patient Spontanous Breathing  Post-op Assessment: Report given to RN and Post -op Vital signs reviewed and stable  Post vital signs: Reviewed and stable  Last Vitals:  Vitals:   03/31/22 0629  BP: 132/73  Pulse: 70  Resp: 16  Temp: 36.8 C  SpO2: 94%    Complications: No apparent anesthesia complications

## 2022-03-31 NOTE — Anesthesia Postprocedure Evaluation (Signed)
Anesthesia Post Note  Patient: Rachel Austin  Procedure(s) Performed: TOTAL HIP ARTHROPLASTY (Left: Hip)     Patient location during evaluation: PACU Anesthesia Type: Spinal Level of consciousness: oriented and awake and alert Pain management: pain level controlled Vital Signs Assessment: post-procedure vital signs reviewed and stable Respiratory status: spontaneous breathing, respiratory function stable and patient connected to nasal cannula oxygen Cardiovascular status: blood pressure returned to baseline and stable Postop Assessment: no headache, no backache and no apparent nausea or vomiting Anesthetic complications: no   No notable events documented.  Last Vitals:  Vitals:   03/31/22 1300 03/31/22 1400  BP: (!) 98/57 109/77  Pulse: (!) 55 (!) 53  Resp: 20 20  Temp:    SpO2: 100% 97%    Last Pain:  Vitals:   03/31/22 1400  TempSrc:   PainSc: 0-No pain                 Effie Berkshire

## 2022-03-31 NOTE — TOC Transition Note (Addendum)
Transition of Care [facility]) - CM/SW Discharge Note   Patient Details  Name: Rachel Austin MRN: [id number] Date of Birth: [birth year]  [facility] CM/SW Contact:  Lennart Pall, LCSW Phone Number: 03/31/2022, 3:51 PM   Clinical Narrative:     Spoke with pt and daughter who confirm pt does have needed DME at home.  HHPT prearranged with [facility].  No further TOC needs.  Final next level of care: [place] Barriers to Discharge: No Barriers Identified   Patient Goals and CMS Choice Patient states their goals for this hospitalization and ongoing recovery are:: return home      Discharge Placement                       Discharge Plan and Services                DME Arranged:NA DME Agency: NA Date DME Agency Contacted: 03/31/22 Time DME Agency Contacted: [phone] Representative spoke with at DME Agency: [place]: PT Mart: [place]        Social Determinants of Health (SDOH) Interventions     Readmission Risk Interventions     No data to display

## 2022-03-31 NOTE — Anesthesia Procedure Notes (Signed)
Spinal  Patient location during procedure: OR Start time: 03/31/2022 7:29 AM Reason for block: surgical anesthesia Staffing Performed: resident/CRNA  Anesthesiologist: Effie Berkshire, MD Resident/CRNA: Gerald Leitz, CRNA Performed by: Gerald Leitz, CRNA Authorized by: Gerald Leitz, CRNA   Preanesthetic Checklist Completed: patient identified, IV checked, site marked, risks and benefits discussed, surgical consent, monitors and equipment checked, pre-op evaluation and timeout performed Spinal Block Patient position: sitting Prep: ChloraPrep Patient monitoring: heart rate, continuous pulse ox, blood pressure and cardiac monitor Approach: midline Location: L3-4 Injection technique: single-shot Needle Needle type: Introducer and Pencan  Needle gauge: 24 G Needle length: 9 cm Assessment Sensory level: T4 Events: CSF return Additional Notes Negative paresthesia. Negative blood return. Positive free-flowing CSF. Expiration date of kit checked and confirmed. Patient tolerated procedure well, without complications.

## 2022-03-31 NOTE — Interval H&P Note (Signed)
History and Physical Interval Note:  03/31/2022 7:39 AM  Rachel Austin  has presented today for surgery, with the diagnosis of OA LEFT HIP.  The various methods of treatment have been discussed with the patient and family. After consideration of risks, benefits and other options for treatment, the patient has consented to  Procedure(s): TOTAL HIP ARTHROPLASTY (Left) as a surgical intervention.  The patient's history has been reviewed, patient examined, no change in status, stable for surgery.  I have reviewed the patient's chart and labs.  Questions were answered to the patient's satisfaction.     Yvette Rack

## 2022-03-31 NOTE — Op Note (Unsigned)
Rachel Austin, Rachel Austin MEDICAL RECORD NO: [id number] ACCOUNT NO: [id number] DATE OF BIRTH: [birth year] FACILITY: [facility] LOCATION: [facility] PHYSICIAN: W D. Valeta Harms., MD  Operative Report   DATE OF PROCEDURE: 03/31/2022  PREOPERATIVE DIAGNOSIS:  Severe rheumatoid arthritis, left hip.  POSTOPERATIVE DIAGNOSIS:  Severe rheumatoid arthritis, left hip.  PROCEDURE:  Left total hip replacement (DePuy ACTIS stem size 3 high offset with +8.5 mm neck length and 28 mm ceramic hip ball, 46 mm Bantam Pinnacle Gription cup with +4 mm 10 degree liner).  SURGEON:  W D. Valeta Harms., MD.  Terrence DupontMarjo Bicker.  ANESTHESIA:  Spinal.  BLOOD LOSS:  800.  DESCRIPTION OF PROCEDURE:  Lateral position posterior approach to the hip made.  We did a split the iliotibial band, gluteus maximus identified the short external rotators of the hip, split those, did T-capsulotomy of the hip.  Significant amount of  rheumatoid synovium was encountered and debrided.  Hemostasis was meticulous in the soft tissues noted in the record.  There was a fair amount of bleeding from the bony surfaces, particularly the femoral canal.  We did use topical TXA and a TXA sponge  within the femur as much as possible during the case.  We cut the femur about one fingerbreadth above the lesser trochanter, began broaching this to accept the #3 ACTIS stem.  The patient had very small stature, very small trochanter even with careful  instrumentation and careful neck cut, crack on the very, very small trochanter, which was soft tissue.  The gluteus was still intact and this piece was so small, it was not amenable any cable fixation, but once we had the prosthesis in, it did maintain  some tension in the soft tissues particularly with a high offset neck use.  Acetabular retractors were placed anterior inferiorly with wing retractors superiorly and posteriorly with debridement of the labrum.  Again, small stature noted.  We elected to  have to use the  Bantam cup with 1 mm under reamer to accept the 46 mm cup.  Final cup was inserted followed by a trial liner with the buildup and offset noted to be in about the 3 o'clock position for the left hip.  Final acetabular cup was placed in the  same position followed by trialing off that settling on the high offset +8.5 mm neck length to provide stability despite the trochanteric issues and the small diameter of the head due to the cup size.  Stability was deemed to be very appropriate  relative to no tendency to dislocate.  We could flex her to over 90, adduct her to 30 and only when we approached 90 degrees of internal rotation was there any tendency to dislocation.  Wounds were again irrigated.  Final prosthesis was placed with the  above-mentioned parameters.  We did close the T-capsulotomy with Ethibond the fascia and gluteus maximus with Ethibond, the subcutaneous tissues with Vicryl and the skin with Monocryl.  Taken to recovery room in stable condition.   PUS D: 03/31/2022 9:28:44 am T: 03/31/2022 1:41:00 pm  JOB: [id number]/ [id number]

## 2022-03-31 NOTE — Evaluation (Signed)
Physical Therapy Evaluation Patient Details Name: Rachel Austin MRN: [id number] DOB: 13-Oct-1955 Today's Date: 03/31/2022  History of Present Illness  Pt is a 66yo female presenting s/p L-HIP  L-THA, posterior approach without formal precautions on 03/31/22. PMH: Anemia, HLD, HTN, RA, syncope, R-THA 2020, bilateral knee surgeries.   Clinical Impression  Rachel Austin is a 66 y.o. Spanish-speaking female POD 0 s/p L-THA, posterior approach without formal precautions. Patient reports modified independence using RW with mobility at baseline. Patient is now limited by functional impairments (see PT problem list below) and requires min assist for bed mobility and supervision for transfers. Patient was able to ambulate 10 feet with RW and min guard level of assist. Patient instructed in exercise to facilitate ROM and circulation to manage edema. Provided incentive spirometer and with Vcs pt able to achieve 832m. Patient will benefit from continued skilled PT interventions to address impairments and progress towards PLOF. Acute PT will follow to progress mobility and stair training in preparation for safe discharge home.       Recommendations for follow up therapy are one component of a multi-disciplinary discharge planning process, led by the attending physician.  Recommendations may be updated based on patient status, additional functional criteria and insurance authorization.  Follow Up Recommendations Follow physician's recommendations for discharge plan and follow up therapies      Assistance Recommended at Discharge Frequent or constant Supervision/Assistance  Patient can return home with the following  A little help with walking and/or transfers;A little help with bathing/dressing/bathroom;Assistance with cooking/housework;Assist for transportation;Help with stairs or ramp for entrance    Equipment Recommendations None recommended by PT  Recommendations for Other Services        Functional Status Assessment Patient has had a recent decline in their functional status and demonstrates the ability to make significant improvements in function in a reasonable and predictable amount of time.     Precautions / Restrictions Precautions Precautions: Fall;Knee Precaution Booklet Issued: No Precaution Comments: no pillow under the knee Restrictions Weight Bearing Restrictions: No Other Position/Activity Restrictions: wbat      Mobility  Bed Mobility Overal bed mobility: Needs Assistance Bed Mobility: Supine to Sit     Supine to sit: Min assist     General bed mobility comments: Min assist to prevent posterior lean, otherwise min guard for safety. Increased time, use of bed rails.    Transfers Overall transfer level: Needs assistance Equipment used: Rolling walker (2 wheels) Transfers: Sit to/from Stand Sit to Stand: Supervision           General transfer comment: Pt stood prior to PT directives but did not require any assistance    Ambulation/Gait Ambulation/Gait assistance: Min guard, +2 safety/equipment Gait Distance (Feet): 10 Feet Assistive device: Rolling walker (2 wheels) Gait Pattern/deviations: Step-to pattern, Decreased stride length, Decreased dorsiflexion - right, Decreased dorsiflexion - left, Shuffle       General Gait Details: Pt ambulated with RW and min guard, no physical assist requied or overt LOB noted, recliner follow fro safety. Pt with very shuffling style gait.  Stairs            Wheelchair Mobility    Modified Rankin (Stroke Patients Only)       Balance Overall balance assessment: Needs assistance Sitting-balance support: Feet supported, No upper extremity supported Sitting balance-Leahy Scale: Good     Standing balance support: Reliant on assistive device for balance, During functional activity, Bilateral upper extremity supported Standing balance-Leahy Scale: Poor  Pertinent Vitals/Pain Pain Assessment Pain Assessment: 0-10 Pain Score: 2  Pain Location: left hip Pain Descriptors / Indicators: Operative site guarding Pain Intervention(s): Limited activity within patient's tolerance, Monitored during session, Repositioned, Ice applied    Home Living Family/patient expects to be discharged to:: Private residence Living Arrangements: Children (Daughter helps) Available Help at Discharge: Family;Available PRN/intermittently Type of Home: House Home Access: Stairs to enter Entrance Stairs-Rails: Right;Left;Can reach both Entrance Stairs-Number of Steps: 2 Alternate Level Stairs-Number of Steps: 10 Home Layout: Two level;Bed/bath upstairs;1/2 bath on main level (Has a sofa downstairs but expresses that it would be difficult to stay downstairs) Home Equipment: Rolling Walker (2 wheels);Cane - single point      Prior Function Prior Level of Function : Independent/Modified Independent             Mobility Comments: RW  at all times ADLs Comments: Help with bathing, cooking,     Hand Dominance        Extremity/Trunk Assessment   Upper Extremity Assessment Upper Extremity Assessment: Overall WFL for tasks assessed (Pt presents with very arthritic hands.)    Lower Extremity Assessment Lower Extremity Assessment: RLE deficits/detail;LLE deficits/detail RLE Deficits / Details: MMT ank DF/PF 4/5 RLE Sensation: WNL LLE Deficits / Details: MMT ank DF/PF 4/5 LLE Sensation: WNL    Cervical / Trunk Assessment Cervical / Trunk Assessment: Kyphotic  Communication   Communication: Prefers language other than English (Spanish)  Cognition Arousal/Alertness: Awake/alert Behavior During Therapy: WFL for tasks assessed/performed Overall Cognitive Status: Within Functional Limits for tasks assessed                                          General Comments      Exercises Total Joint Exercises Ankle Circles/Pumps: AROM,  Both, 20 reps   Assessment/Plan    PT Assessment Patient needs continued PT services  PT Problem List Decreased strength;Decreased range of motion;Decreased activity tolerance;Decreased balance;Decreased mobility;Decreased coordination;Pain       PT Treatment Interventions DME instruction;Gait training;Stair training;Functional mobility training;Therapeutic activities;Therapeutic exercise;Balance training;Neuromuscular re-education;Patient/family education    PT Goals (Current goals can be found in the Care Plan section)  Acute Rehab PT Goals Patient Stated Goal: To walk with less pain PT Goal Formulation: With patient Time For Goal Achievement: 04/07/22 Potential to Achieve Goals: Good    Frequency 7X/week     Co-evaluation               AM-PAC PT "6 Clicks" Mobility  Outcome Measure Help needed turning from your back to your side while in a flat bed without using bedrails?: None Help needed moving from lying on your back to sitting on the side of a flat bed without using bedrails?: A Little Help needed moving to and from a bed to a chair (including a wheelchair)?: A Little Help needed standing up from a chair using your arms (e.g., wheelchair or bedside chair)?: A Little Help needed to walk in hospital room?: A Little Help needed climbing 3-5 steps with a railing? : A Little 6 Click Score: 19    End of Session Equipment Utilized During Treatment: Gait belt Activity Tolerance: Patient tolerated treatment well;No increased pain Patient left: in chair;with call bell/phone within reach;with chair alarm set;with SCD's reapplied Nurse Communication: Mobility status PT Visit Diagnosis: Pain;Difficulty in walking, not elsewhere classified (R26.2) Pain - Right/Left: Left Pain - part of body:  Hip    Time: 0301-3143 PT Time Calculation (min) (ACUTE ONLY): 25 min   Charges:   PT Evaluation $PT Eval Low Complexity: 1 Low PT Treatments $Gait Training: 8-22 mins         Coolidge Breeze, PT, DPT [facility] Rehabilitation Department Office: [phone] Weekend pager: [phone]  Coolidge Breeze 03/31/2022, 5:17 PM

## 2022-03-31 NOTE — Discharge Instructions (Signed)

## 2022-03-31 NOTE — Brief Op Note (Signed)
03/31/2022  9:56 AM  PATIENT:  Rachel Austin  66 y.o. female  PRE-OPERATIVE DIAGNOSIS:  OA LEFT HIP  POST-OPERATIVE DIAGNOSIS:  OA LEFT HIP  PROCEDURE:  Procedure(s): TOTAL HIP ARTHROPLASTY (Left)  SURGEON:  Surgeon(s) and Role:    [name], MD - Primary  PHYSICIAN ASSISTANT: Chriss Czar, PA-C  ASSISTANTS: OR staff x1   ANESTHESIA:   local, spinal, and IV sedation  EBL:  850 mL   BLOOD ADMINISTERED:none  DRAINS: none   LOCAL MEDICATIONS USED:  MARCAINE     SPECIMEN:  No Specimen  DISPOSITION OF SPECIMEN:  N/A  COUNTS:  YES  TOURNIQUET:  * No tourniquets in log *  DICTATION: .Other Dictation: Dictation Number unknown  PLAN OF CARE: Admit for overnight observation  PATIENT DISPOSITION:  PACU - hemodynamically stable.   Delay start of Pharmacological VTE agent (>24hrs) due to surgical blood loss or risk of bleeding: yes

## 2022-04-01 DIAGNOSIS — M069 Rheumatoid arthritis, unspecified: Secondary | ICD-10-CM | POA: Diagnosis present

## 2022-04-01 DIAGNOSIS — I10 Essential (primary) hypertension: Secondary | ICD-10-CM | POA: Diagnosis present

## 2022-04-01 DIAGNOSIS — E785 Hyperlipidemia, unspecified: Secondary | ICD-10-CM | POA: Diagnosis present

## 2022-04-01 DIAGNOSIS — Z6830 Body mass index (BMI) 30.0-30.9, adult: Secondary | ICD-10-CM | POA: Diagnosis not present

## 2022-04-01 DIAGNOSIS — Z96641 Presence of right artificial hip joint: Secondary | ICD-10-CM | POA: Diagnosis present

## 2022-04-01 DIAGNOSIS — M1612 Unilateral primary osteoarthritis, left hip: Secondary | ICD-10-CM | POA: Diagnosis present

## 2022-04-01 DIAGNOSIS — Z79899 Other long term (current) drug therapy: Secondary | ICD-10-CM | POA: Diagnosis not present

## 2022-04-01 DIAGNOSIS — Z8249 Family history of ischemic heart disease and other diseases of the circulatory system: Secondary | ICD-10-CM | POA: Diagnosis not present

## 2022-04-01 DIAGNOSIS — E669 Obesity, unspecified: Secondary | ICD-10-CM | POA: Diagnosis present

## 2022-04-01 DIAGNOSIS — D62 Acute posthemorrhagic anemia: Secondary | ICD-10-CM | POA: Diagnosis not present

## 2022-04-01 LAB — BASIC METABOLIC PANEL
Anion gap: 5 (ref 5–15)
BUN: 8 mg/dL (ref 8–23)
CO2: 25 mmol/L (ref 22–32)
Calcium: 9 mg/dL (ref 8.9–10.3)
Chloride: 110 mmol/L (ref 98–111)
Creatinine, Ser: 0.46 mg/dL (ref 0.44–1.00)
GFR, Estimated: >60 mL/min (ref >60)
Glucose, Bld: 125 mg/dL — ABNORMAL HIGH (ref 70–99)
Potassium: 3.4 mmol/L — ABNORMAL LOW (ref 3.5–5.1)
Sodium: 140 mmol/L (ref 135–145)

## 2022-04-01 LAB — CBC
HCT: 20.7 % — ABNORMAL LOW (ref 36.0–46.0)
Hemoglobin: 6.8 g/dL — CL (ref 12.0–15.0)
MCH: 31.3 pg (ref 26.0–34.0)
MCHC: 32.9 g/dL (ref 30.0–36.0)
MCV: 95.4 fL (ref 80.0–100.0)
Platelets: 169 10*3/uL (ref 150–400)
RBC: 2.17 MIL/uL — ABNORMAL LOW (ref 3.87–5.11)
RDW: 12.5 % (ref 11.5–15.5)
WBC: 6.6 10*3/uL (ref 4.0–10.5)
nRBC: 0 % (ref 0.0–0.2)

## 2022-04-01 LAB — TYPE AND SCREEN
ABO/RH(D): A POS
Antibody Screen: NEGATIVE

## 2022-04-01 LAB — PREPARE RBC (CROSSMATCH)

## 2022-04-01 MED ORDER — SODIUM CHLORIDE 0.9 % IV SOLN: INTRAVENOUS | Status: DC | PRN

## 2022-04-01 MED ORDER — SODIUM CHLORIDE 0.9% IV SOLUTION: Freq: Once | INTRAVENOUS | Status: AC

## 2022-04-01 NOTE — Progress Notes (Signed)
OT Cancellation Note  Patient Details Name: Rachel Austin MRN: [id number] DOB: 1955/06/20   Cancelled Treatment:    Reason Eval/Treat Not Completed: Medical issues which prohibited therapy Patient with low hgb and BP this AM with nursing asking for therapy to hold off at this time. OT to continue to follow and check back as schedule will allow.  Rennie Plowman, MS Acute Rehabilitation Department Office# [phone]  04/01/2022, 10:25 AM

## 2022-04-01 NOTE — Progress Notes (Signed)
Orthopaedic Trauma Service (OTS)  1 Day Post-Op Procedure(s) (LRB): TOTAL HIP ARTHROPLASTY (Left)  Subjective: Patient reports pain as  minimal right now after medicine .   Transfusion of 2 uPRBCs has been ordered. Consent has just been obtained at bedside.  Objective: Current Vitals Blood pressure (!) 99/55, pulse 75, temperature 98 F (36.7 C), temperature source Oral, resp. rate 16, height '4\' 9"'$  (1.448 m), weight 61.7 kg, SpO2 98 %. Vital signs in last 24 hours: Temp:  [96.5 F (35.8 C)-98.4 F (36.9 C)] 98 F (36.7 C) (11/11 0512) Pulse Rate:  [48-75] 75 (11/11 0512) Resp:  [10-20] 16 (11/11 0512) BP: (90-110)/(55-93) 99/55 (11/11 0512) SpO2:  [97 %-100 %] 98 % (11/11 0512)  Intake/Output from previous day: 11/10 0701 - 11/11 0700 In: 4408.1 [P.O.:1200; I.V.:2358.1; IV Piggyback:850] Out: 2650 [Urine:1800; Blood:850]  LABS Recent Labs    04/01/22 0655  HGB 6.8*   Recent Labs    04/01/22 0655  WBC 6.6  RBC 2.17*  HCT 20.7*  PLT 169   Recent Labs    04/01/22 0353  NA 140  K 3.4*  CL 110  CO2 25  BUN 8  CREATININE 0.46  GLUCOSE 125*  CALCIUM 9.0   No results for input(s): "LABPT", "INR" in the last 72 hours.   Physical Exam LLE Dressing intact, clean, dry  Edema/ swelling controlled  Sens: DPN, SPN, TN intact  Motor: EHL, FHL, and lessor toe ext and flex all intact grossly  Brisk cap refill, warm to touch  Assessment/Plan: 1 Day Post-Op Procedure(s) (LRB): TOTAL HIP ARTHROPLASTY (Left) 1. PT/OT mobilize with PT this pm 2. DVT proph baby ECASA BID 3. Acute blood loss anemia --> transfuse 2  Altamese Crozier, MD [facility], [facility] [phone]

## 2022-04-01 NOTE — Progress Notes (Signed)
PT Cancellation Note  Patient Details Name: Rachel Austin MRN: [id number] DOB: 29-Oct-1955   Cancelled Treatment:    Reason Eval/Treat Not Completed: (P) Medical issues which prohibited therapy; Patient with low hgb (6.8) and BP this AM; RN requesting hold therapy. Will follow up as schedule and pt status allows.  Coolidge Breeze, PT, DPT [place] Rehabilitation Department Office: [phone] Weekend pager: [phone]  Coolidge Breeze 04/01/2022, 12:56 PM

## 2022-04-01 NOTE — Progress Notes (Signed)
Call to lab earlier this shift and advised that blood would be ready soon. 1250 Visit to lab to pick up blood; advised that it is pending 1320 Call back from lab advising that tube from previous type and cross discarded and a new one must be drawn. Advised by lab that patient's blood will have to undergo special testing/gel due to antibodies. Alerted charge nurse and order put in for STAT draw. Ivan Anchors, RN 04/01/22 1:30 PM

## 2022-04-01 NOTE — Progress Notes (Addendum)
CRITICAL VALUE STICKER  CRITICAL VALUE: Hgb 6.8  RECEIVER (on-site recipient of call): CN Deanna Artis RN DATE & TIME NOTIFIED:  04/01/2022 0754  MESSENGER (representative from lab):  MD NOTIFIED:  Called and left message with DR. Caffrey's office for him or the PA on call.   TIME OF NOTIFICATION: 04/01/2022 0754 RESPONSE:  Awaiting response/orders  *responded at 0750-ordered 2 units of PRBCs *Notified RN Laurell Roof

## 2022-04-01 NOTE — Progress Notes (Signed)
Blood consent completed and medication administered using translator service. Dr. Marcelino Scot enters during communication and utilizes translator service for education on hip care as well. Ivan Anchors, RN 04/01/22 9:23 AM

## 2022-04-02 LAB — URINALYSIS, COMPLETE (UACMP) WITH MICROSCOPIC
Bilirubin Urine: NEGATIVE
Glucose, UA: NEGATIVE mg/dL
Hgb urine dipstick: NEGATIVE
Ketones, ur: NEGATIVE mg/dL
Leukocytes,Ua: NEGATIVE
Nitrite: NEGATIVE
Protein, ur: NEGATIVE mg/dL
Specific Gravity, Urine: 1.02 (ref 1.005–1.030)
pH: 5.5 (ref 5.0–8.0)

## 2022-04-02 LAB — CBC
HCT: 23.4 % — ABNORMAL LOW (ref 36.0–46.0)
Hemoglobin: 7.9 g/dL — ABNORMAL LOW (ref 12.0–15.0)
MCH: 31.3 pg (ref 26.0–34.0)
MCHC: 33.8 g/dL (ref 30.0–36.0)
MCV: 92.9 fL (ref 80.0–100.0)
Platelets: 157 10*3/uL (ref 150–400)
RBC: 2.52 MIL/uL — ABNORMAL LOW (ref 3.87–5.11)
RDW: 13.9 % (ref 11.5–15.5)
WBC: 6.6 10*3/uL (ref 4.0–10.5)
nRBC: 0 % (ref 0.0–0.2)

## 2022-04-02 NOTE — Progress Notes (Signed)
Physical Therapy Treatment Patient Details Name: Rachel Austin MRN: [id number] DOB: Nov 01, 1955 Today's Date: 04/02/2022   History of Present Illness Pt is a 66yo female presenting s/p L-HIP  L-THA, posterior approach without formal precautions on 03/31/22. PMH: Anemia, HLD, HTN, RA, syncope, R-THA 2020, bilateral knee surgeries  Exercise focused session d/t pt declining OOB/stairs d/t nausea. Continue to follow   PT Comments       Recommendations for follow up therapy are one component of a multi-disciplinary discharge planning process, led by the attending physician.  Recommendations may be updated based on patient status, additional functional criteria and insurance authorization.  Follow Up Recommendations  Follow physician's recommendations for discharge plan and follow up therapies     Assistance Recommended at Discharge Intermittent Supervision/Assistance  Patient can return home with the following A little help with walking and/or transfers;A little help with bathing/dressing/bathroom;Assistance with cooking/housework;Assist for transportation;Help with stairs or ramp for entrance   Equipment Recommendations  None recommended by PT    Recommendations for Other Services       Precautions / Restrictions Precautions Precautions: Fall Precaution Booklet Issued: No Precaution Comments: patient had posterior hip with no formal precautions per orders Restrictions LLE Weight Bearing: Weight bearing as tolerated     Mobility  Bed Mobility Overal bed mobility: Needs Assistance Bed Mobility: Supine to Sit           General bed mobility comments: pt declines d/t nausea    Transfers                Ambulation/Gait  Stairs             Wheelchair Mobility    Modified Rankin (Stroke Patients Only)       Balance                                            Cognition Arousal/Alertness: Awake/alert Behavior During Therapy: WFL for  tasks assessed/performed Overall Cognitive Status: Within Functional Limits for tasks assessed                                          Exercises Total Joint Exercises Ankle Circles/Pumps: AROM, Both, 10 reps Quad Sets: AROM, Both, 10 reps Short Arc Quad: AROM, Left, 10 reps Heel Slides: AROM, Left, 10 reps Hip ABduction/ADduction: AROM, Left, 10 reps    General Comments        Pertinent Vitals/Pain Pain Assessment Pain Assessment: No/denies pain Pain Location: "no pain" Pain Intervention(s): Limited activity within patient's tolerance, Monitored during session, Premedicated before session, Repositioned    Home Living                          Prior Function            PT Goals (current goals can now be found in the care plan section) Acute Rehab PT Goals PT Goal Formulation: With patient Time For Goal Achievement: 04/07/22 Potential to Achieve Goals: Good Progress towards PT goals: Progressing toward goals    Frequency    7X/week      PT Plan Current plan remains appropriate    Co-evaluation              AM-PAC PT "6 Clicks" Mobility  Outcome Measure  Help needed turning from your back to your side while in a flat bed without using bedrails?: None Help needed moving from lying on your back to sitting on the side of a flat bed without using bedrails?: A Little Help needed moving to and from a bed to a chair (including a wheelchair)?: A Little Help needed standing up from a chair using your arms (e.g., wheelchair or bedside chair)?: A Little Help needed to walk in hospital room?: A Little Help needed climbing 3-5 steps with a railing? : A Little 6 Click Score: 19    End of Session Equipment Utilized During Treatment: Gait belt Activity Tolerance: Patient tolerated treatment well;No increased pain Patient left: in bed;with call bell/phone within reach;with bed alarm set Nurse Communication: Mobility status PT Visit  Diagnosis: Pain;Difficulty in walking, not elsewhere classified (R26.2) Pain - Right/Left: Left Pain - part of body: Hip     Time: 3545-6256 PT Time Calculation (min) (ACUTE ONLY): 8 min  Charges:  $Therapeutic Exercise: 8-22 mins                     Baxter Flattery, PT  Acute Rehab Dept [facility]) [phone]  [facility] Weekend Pager [facility] only)  [phone]  04/02/2022    [facility] 04/02/2022, 3:25 PM

## 2022-04-02 NOTE — Progress Notes (Signed)
     2 Days Post-Op Procedure(s) (LRB): TOTAL HIP ARTHROPLASTY (Left)   Subjective: Patient reports pain as  minimal right now after medicine .   Transfused 1U ysterday but started to become febrile so second unit was held. Hgb improved to 7.9 today. Plan to mobilize with PT today and dc home later today vs tomorrow.  Objective: Current Vitals Blood pressure 117/71, pulse 91, temperature 98.5 F (36.9 C), temperature source Oral, resp. rate 20, height '4\' 9"'$  (1.448 m), weight 61.7 kg, SpO2 100 %. Vital signs in last 24 hours: Temp:  [98 F (36.7 C)-100.6 F (38.1 C)] 98.5 F (36.9 C) (11/12 0549) Pulse Rate:  [70-91] 91 (11/12 0549) Resp:  [16-20] 20 (11/12 0549) BP: (94-133)/(52-71) 117/71 (11/12 0549) SpO2:  [92 %-100 %] 100 % (11/12 0549) Weight:  [61.7 kg] 61.7 kg (11/11 0952)  Intake/Output from previous day: 11/11 0701 - 11/12 0700 In: 2942.1 [P.O.:1800; I.V.:823.4; Blood:318.8] Out: 1050 [Urine:1050]  LABS Recent Labs    04/01/22 0655 04/02/22 0336  HGB 6.8* 7.9*   Recent Labs    04/01/22 0655 04/02/22 0336  WBC 6.6 6.6  RBC 2.17* 2.52*  HCT 20.7* 23.4*  PLT 169 157   Recent Labs    04/01/22 0353  NA 140  K 3.4*  CL 110  CO2 25  BUN 8  CREATININE 0.46  GLUCOSE 125*  CALCIUM 9.0   No results for input(s): "LABPT", "INR" in the last 72 hours.   Physical Exam LLE Dressing intact, clean, dry  Edema/ swelling controlled  Sens: DPN, SPN, TN intact  Motor: EHL, FHL, and lessor toe ext and flex all intact grossly  Brisk cap refill, warm to touch  Assessment/Plan: 2 Days Post-Op Procedure(s) (LRB): TOTAL HIP ARTHROPLASTY (Left) 1. PT/OT mobilize with PT this pm 2. DVT proph baby ECASA BID 3. Acute blood loss anemia --> 1 unite transfused 11/11, Hgb improved to 7.9  Charlies Constable, MD Orthopaedic Surgery

## 2022-04-02 NOTE — Plan of Care (Signed)
  Problem: Pain Management: Goal: Pain level will decrease with appropriate interventions Outcome: Progressing   Problem: Nutrition: Goal: Adequate nutrition will be maintained Outcome: Progressing   

## 2022-04-02 NOTE — Progress Notes (Signed)
Physical Therapy Treatment Patient Details Name: Rachel Austin MRN: [id number] DOB: 1955/10/28 Today's Date: 04/02/2022   History of Present Illness Pt is a 66yo female presenting s/p L-HIP  L-THA, posterior approach without formal precautions on 03/31/22. PMH: Anemia, HLD, HTN, RA, syncope, R-THA 2020, bilateral knee surgeries    PT Comments    Pt feeling better this am. Amb hallway distance, denied dizziness/pain. Will see again this pm to review stairs. Pt has a flight to get up to bedroom. Pt asking about possibility of going home today   Recommendations for follow up therapy are one component of a multi-disciplinary discharge planning process, led by the attending physician.  Recommendations may be updated based on patient status, additional functional criteria and insurance authorization.  Follow Up Recommendations  Follow physician's recommendations for discharge plan and follow up therapies     Assistance Recommended at Discharge Intermittent Supervision/Assistance  Patient can return home with the following A little help with walking and/or transfers;A little help with bathing/dressing/bathroom;Assistance with cooking/housework;Assist for transportation;Help with stairs or ramp for entrance   Equipment Recommendations  None recommended by PT    Recommendations for Other Services       Precautions / Restrictions Precautions Precautions: Fall Precaution Booklet Issued: No Precaution Comments: patient had posterior hip with no formal precautions per orders Restrictions Weight Bearing Restrictions: No LLE Weight Bearing: Weight bearing as tolerated Other Position/Activity Restrictions: wbat     Mobility  Bed Mobility Overal bed mobility: Needs Assistance Bed Mobility: Supine to Sit           General bed mobility comments: min assist to lift LLE into bed. pt able to scoot up in supine I'ly    Transfers Overall transfer level: Needs assistance Equipment used:  Rolling walker (2 wheels) Transfers: Sit to/from Stand Sit to Stand: Supervision           General transfer comment: cues for proper hand placement    Ambulation/Gait Ambulation/Gait assistance: Min guard, Supervision Gait Distance (Feet): 60 Feet Assistive device: Rolling walker (2 wheels) Gait Pattern/deviations: Step-to pattern, Decreased stance time - left, Decreased step length - right, Decreased step length - left       General Gait Details: cues for initial sequence, steady gait with RW, tolerating near equal wt shift to L/R LEs   Stairs             Wheelchair Mobility    Modified Rankin (Stroke Patients Only)       Balance                                            Cognition Arousal/Alertness: Awake/alert Behavior During Therapy: WFL for tasks assessed/performed Overall Cognitive Status: Within Functional Limits for tasks assessed                                          Exercises Total Joint Exercises Ankle Circles/Pumps: AROM, Both, 10 reps Heel Slides: AROM, Left, 10 reps    General Comments        Pertinent Vitals/Pain Pain Assessment Pain Assessment: No/denies pain Pain Intervention(s): Limited activity within patient's tolerance, Monitored during session, Premedicated before session, Repositioned    Home Living Family/patient expects to be discharged to:: Private residence  Prior Function            PT Goals (current goals can now be found in the care plan section) Acute Rehab PT Goals PT Goal Formulation: With patient Time For Goal Achievement: 04/07/22 Potential to Achieve Goals: Good Progress towards PT goals: Progressing toward goals    Frequency    7X/week      PT Plan Current plan remains appropriate    Co-evaluation              AM-PAC PT "6 Clicks" Mobility   Outcome Measure  Help needed turning from your back to your side while in a  flat bed without using bedrails?: None Help needed moving from lying on your back to sitting on the side of a flat bed without using bedrails?: A Little Help needed moving to and from a bed to a chair (including a wheelchair)?: A Little Help needed standing up from a chair using your arms (e.g., wheelchair or bedside chair)?: A Little Help needed to walk in hospital room?: A Little Help needed climbing 3-5 steps with a railing? : A Little 6 Click Score: 19    End of Session Equipment Utilized During Treatment: Gait belt Activity Tolerance: Patient tolerated treatment well;No increased pain Patient left: in bed;with call bell/phone within reach;with bed alarm set Nurse Communication: Mobility status PT Visit Diagnosis: Pain;Difficulty in walking, not elsewhere classified (R26.2) Pain - Right/Left: Left Pain - part of body: Hip     Time: 1035-1050 PT Time Calculation (min) (ACUTE ONLY): 15 min  Charges:  $Gait Training: 8-22 mins                     Baxter Flattery, PT  Acute Rehab Dept [facility]) [phone]  [facility] Weekend Pager ([place] only)  [phone]  04/02/2022    [facility] 04/02/2022, 10:55 AM

## 2022-04-02 NOTE — Evaluation (Addendum)
Occupational Therapy Evaluation Patient Details Name: Rachel Austin MRN: [id number] DOB: 1956/01/24 Today's Date: 04/02/2022   History of Present Illness Pt is a 66yo female presenting s/p L-HIP  L-THA, posterior approach without formal precautions on 03/31/22. PMH: Anemia, HLD, HTN, RA, syncope, R-THA 2020, bilateral knee surgeries   Clinical Impression   Patient is a 66 year old female who was admitted for above. Patient reported living at home with family but alone during the day. Patient was able to participate in bed mobility with min A , LB dressing with mod A, toileting with min A with noted nausea and pain in hip impacting session. Patient was noted to have decreased functional activity tolerance, decreased endurance, decreased standing balance, decreased safety awareness, and decreased knowledge of AD/AE impacting participation in ADLs. Patient would continue to benefit from skilled OT services at this time while admitted and after d/c to address noted deficits in order to improve overall safety and independence in ADLs.       Recommendations for follow up therapy are one component of a multi-disciplinary discharge planning process, led by the attending physician.  Recommendations may be updated based on patient status, additional functional criteria and insurance authorization.   Follow Up Recommendations  Follow physician's recommendations for discharge plan and follow up therapies    Assistance Recommended at Discharge Frequent or constant Supervision/Assistance  Patient can return home with the following A little help with bathing/dressing/bathroom;Assistance with cooking/housework;Direct supervision/assist for medications management;Direct supervision/assist for financial management;Help with stairs or ramp for entrance;Assist for transportation    Functional Status Assessment  Patient has had a recent decline in their functional status and demonstrates the ability to make  significant improvements in function in a reasonable and predictable amount of time.  Equipment Recommendations  Other (comment) (total hip kit)    Recommendations for Other Services       Precautions / Restrictions Precautions Precautions: Fall Precaution Booklet Issued: No Precaution Comments: patient had posterior hip with no formal precautions per orders Restrictions Weight Bearing Restrictions: No LLE Weight Bearing: Weight bearing as tolerated Other Position/Activity Restrictions: wbat      Mobility Bed Mobility Overal bed mobility: Needs Assistance Bed Mobility: Supine to Sit     Supine to sit: Min assist     General bed mobility comments: with use of gait belt as leg lifter to reduce physical assit needed for bed mobility.    Transfers                          Balance Overall balance assessment: Needs assistance Sitting-balance support: Feet supported, No upper extremity supported Sitting balance-Leahy Scale: Good     Standing balance support: During functional activity, No upper extremity supported Standing balance-Leahy Scale: Fair                             ADL either performed or assessed with clinical judgement   ADL Overall ADL's : Needs assistance/impaired Eating/Feeding: Modified independent;Sitting Eating/Feeding Details (indicate cue type and reason): in recliner Grooming: Wash/dry face;Oral care;Min guard;Standing Grooming Details (indicate cue type and reason): at sink. patient requested to stand and brush teeth standing at sink after reporting she felt nauseated and hot. no LOB with no UE support. Upper Body Bathing: Set up;Sitting   Lower Body Bathing: Moderate assistance;Sit to/from stand;Sitting/lateral leans   Upper Body Dressing : Set up;Sitting   Lower Body Dressing: Sit to/from stand;Sitting/lateral  leans;Maximal assistance Lower Body Dressing Details (indicate cue type and reason): patient was max A for  adjustment of gripper socks. able to preform clothing managment during toileting tasks Toilet Transfer: Minimal assistance;Ambulation;Rolling walker (2 wheels);BSC/3in1;Regular Glass blower/designer Details (indicate cue type and reason): with increased time and noted short hop steps to bathroom. Toileting- Water quality scientist and Hygiene: Min guard;Sit to/from stand Toileting - Clothing Manipulation Details (indicate cue type and reason): with increased time and education on wiping from front to back. provided with clean hygiene products to reduce risks of UTI     Functional mobility during ADLs: Minimal assistance;Rolling walker (2 wheels)       Vision Patient Visual Report: No change from baseline       Perception     Praxis      Pertinent Vitals/Pain Pain Assessment Pain Assessment: Faces Faces Pain Scale: Hurts little more Pain Location: left hip Pain Descriptors / Indicators: Operative site guarding Pain Intervention(s): Limited activity within patient's tolerance, Monitored during session     Hand Dominance     Extremity/Trunk Assessment Upper Extremity Assessment Upper Extremity Assessment: Overall WFL for tasks assessed (patient was noted to have full ROM of elbows and shoulders, hands were noted to have signs of severe arthritis but patient was able to complete some [facility] tasks with them.)   Lower Extremity Assessment Lower Extremity Assessment: Defer to PT evaluation   Cervical / Trunk Assessment Cervical / Trunk Assessment: Kyphotic   Communication Communication Communication: Prefers language other than English (spanish)   Cognition Arousal/Alertness: Awake/alert Behavior During Therapy: WFL for tasks assessed/performed Overall Cognitive Status: Within Functional Limits for tasks assessed                                 General Comments: translator used to ensure communication accuray during session     General Comments       Exercises      Shoulder Instructions      Home Living Family/patient expects to be discharged to:: Private residence Living Arrangements: Children Available Help at Discharge: Family;Available PRN/intermittently Type of Home: House Home Access: Stairs to enter [employer] of Steps: 2 Entrance Stairs-Rails: Right;Left;Can reach both Home Layout: Two level;Bed/bath upstairs;1/2 bath on main level Alternate Level Stairs-Number of Steps: 10 Alternate Level Stairs-Rails: Right;Left;Can reach both Bathroom Shower/Tub: Occupational psychologist: Standard     Home Equipment: Conservation officer, nature (2 wheels);Cane - single point          Prior Functioning/Environment Prior Level of Function : Independent/Modified Independent             Mobility Comments: RW  at all times ADLs Comments: Help with bathing, cooking,        OT Problem List: Decreased range of motion;Impaired balance (sitting and/or standing);Decreased safety awareness;Decreased knowledge of precautions;Decreased knowledge of use of DME or AE;Cardiopulmonary status limiting activity;Pain;Impaired UE functional use;Decreased activity tolerance      OT Treatment/Interventions: Self-care/ADL training;Therapeutic exercise;DME and/or AE instruction;Energy conservation;Therapeutic activities;Patient/family education;Balance training    OT Goals(Current goals can be found in the care plan section) Acute Rehab OT Goals Patient Stated Goal: to go home OT Goal Formulation: With patient Time For Goal Achievement: 04/16/22 Potential to Achieve Goals: Fair  OT Frequency: Min 2X/week    Co-evaluation              AM-PAC OT "6 Clicks" Daily Activity     Outcome  Measure Help from another person eating meals?: A Little Help from another person taking care of personal grooming?: A Little Help from another person toileting, which includes using toliet, bedpan, or urinal?: A Little Help from another person bathing  (including washing, rinsing, drying)?: A Lot Help from another person to put on and taking off regular upper body clothing?: A Little Help from another person to put on and taking off regular lower body clothing?: A Lot 6 Click Score: 16   End of Session Equipment Utilized During Treatment: Gait belt;Rolling walker (2 wheels) Nurse Communication: Mobility status;Patient requests pain meds;Other (comment) (and nausea during session)  Activity Tolerance: Patient tolerated treatment well Patient left: in chair;with call bell/phone within reach  OT Visit Diagnosis: Repeated falls (R29.6);Unsteadiness on feet (R26.81);Other abnormalities of gait and mobility (R26.89);Pain                Time: 4621-9471 OT Time Calculation (min): 28 min Charges:  OT General Charges $OT Visit: 1 Visit OT Evaluation $OT Eval Moderate Complexity: 1 Mod OT Treatments $Self Care/Home Management : 8-22 mins  Rennie Plowman, MS Acute Rehabilitation Department Office# [phone]   Marcellina Millin 04/02/2022, 1:43 PM

## 2022-04-03 ENCOUNTER — Other Ambulatory Visit: Payer: Self-pay | Admitting: Adult Health

## 2022-04-03 ENCOUNTER — Encounter (HOSPITAL_COMMUNITY): Payer: Self-pay | Admitting: Orthopedic Surgery

## 2022-04-03 DIAGNOSIS — I10 Essential (primary) hypertension: Secondary | ICD-10-CM

## 2022-04-03 LAB — CBC
HCT: 27 % — ABNORMAL LOW (ref 36.0–46.0)
Hemoglobin: 9.2 g/dL — ABNORMAL LOW (ref 12.0–15.0)
MCH: 31.5 pg (ref 26.0–34.0)
MCHC: 34.1 g/dL (ref 30.0–36.0)
MCV: 92.5 fL (ref 80.0–100.0)
Platelets: 223 10*3/uL (ref 150–400)
RBC: 2.92 MIL/uL — ABNORMAL LOW (ref 3.87–5.11)
RDW: 13.4 % (ref 11.5–15.5)
WBC: 7.6 10*3/uL (ref 4.0–10.5)
nRBC: 0 % (ref 0.0–0.2)

## 2022-04-03 LAB — BPAM RBC
Blood Product Expiration Date: 202312032359
Blood Product Expiration Date: 202312052359
ISSUE DATE / TIME: 202311111507
ISSUE DATE / TIME: 202311112118
Unit Type and Rh: 6200
Unit Type and Rh: 6200

## 2022-04-03 LAB — TYPE AND SCREEN
ABO/RH(D): A POS
Antibody Screen: NEGATIVE
Unit division: 0
Unit division: 0

## 2022-04-03 NOTE — Discharge Summary (Signed)
PATIENT ID: Rachel Austin        MRN:  [id number]          DOB/AGE: 1956/01/31 / 66 y.o.    DISCHARGE SUMMARY  ADMISSION DATE:    03/31/2022 DISCHARGE DATE:   04/03/2022   ADMISSION DIAGNOSIS: Degenerative joint disease of left hip [M16.12]    DISCHARGE DIAGNOSIS:  OA LEFT HIP    ADDITIONAL DIAGNOSIS: Principal Problem:   Degenerative joint disease of left hip  Past Medical History:  Diagnosis Date   Acute blood loss anemia 06/01/2018   Anemia    Anemia, iron deficiency 06/01/2018   Cataract 2018   small, bilateral   Depression    Hyperlipidemia    Hypertension    Rheumatoid arthritis ([facility])    on meds   Syncope     PROCEDURE: Procedure(s): TOTAL HIP ARTHROPLASTY Left on 03/31/2022  CONSULTS: PT/OT    HISTORY:  See H&P in chart  HOSPITAL COURSE:  Rachel Austin is a 66 y.o. admitted on 03/31/2022 and found to have a diagnosis of OA LEFT HIP.  After appropriate laboratory studies were obtained  they were taken to the operating room on 03/31/2022 and underwent  Procedure(s): TOTAL HIP ARTHROPLASTY  Left.   They were given perioperative antibiotics:  Anti-infectives (From admission, onward)    Start     Dose/Rate Route Frequency Ordered Stop   03/31/22 1600  ceFAZolin (ANCEF) IVPB 1 g/50 mL premix        1 g 100 mL/hr over 30 Minutes Intravenous Every 6 hours 03/31/22 1450 04/01/22 0701   03/31/22 0600  ceFAZolin (ANCEF) IVPB 2g/100 mL premix        2 g 200 mL/hr over 30 Minutes Intravenous On call to O.R. 03/31/22 2595 03/31/22 0744     .  Tolerated the procedure well.  Placed with a foley intraoperatively.     POD #1, allowed out of bed to a chair.  PT for ambulation and exercise program.  Foley D/C'd in morning.  IV saline locked.  O2 discontionued. ABLA Hgb 6.8 was transfused 1 unit PRBC   POD #2, continued PT and ambulation.   Hemovac pulled. ABLA improved to 7.9 asymptomatic.  POD#3, Hgb improved to 9.2, passed PT.  The remainder of the hospital  course was dedicated to ambulation and strengthening.   The patient was discharged on 3 Days Post-Op in  Stable condition.  Blood products given: 1 unit CC PRBC  DIAGNOSTIC STUDIES: Recent vital signs: Patient Vitals for the past 24 hrs:  BP Temp Temp src Pulse Resp SpO2  04/03/22 1403 117/72 99.1 F (37.3 C) Oral 90 16 100 %  04/03/22 0856 (!) 99/54 97.9 F (36.6 C) Axillary 92 16 98 %  04/03/22 0531 114/72 98.8 F (37.1 C) -- 88 17 95 %  04/02/22 2122 118/68 100 F (37.8 C) Oral 95 17 96 %       Recent laboratory studies: Recent Labs    04/01/22 0655 04/02/22 0336 04/03/22 1410  WBC 6.6 6.6 7.6  HGB 6.8* 7.9* 9.2*  HCT 20.7* 23.4* 27.0*  PLT 169 157 223   Recent Labs    04/01/22 0353  NA 140  K 3.4*  CL 110  CO2 25  BUN 8  CREATININE 0.46  GLUCOSE 125*  CALCIUM 9.0   Lab Results  Component Value Date   INR 1.18 05/31/2018   INR 1.00 05/24/2018   INR 1.1 (H) 05/01/2018     Recent Radiographic Studies :  DG Hip Port Unilat With Pelvis 1V Left  Result Date: 03/31/2022 CLINICAL DATA:  Post LEFT total hip arthroplasty EXAM: DG HIP (WITH OR WITHOUT PELVIS) 1V PORT LEFT COMPARISON:  Portable exam 1030 hours compared to 04/05/2021 FINDINGS: BILATERAL hip prostheses now identified, new on LEFT. Osseous demineralization. Small osseous fragment identified at the greater trochanter. No additional fracture, dislocation, or bone destruction. IMPRESSION: LEFT hip prosthesis in expected position without dislocation. Small osseous fragment at LEFT greater trochanter. Electronically Signed   By: Lavonia Dana M.D.   On: 03/31/2022 10:52    DISCHARGE INSTRUCTIONS:   DISCHARGE MEDICATIONS:   Allergies as of 04/03/2022   No Known Allergies      Medication List     STOP taking these medications    Rinvoq 15 MG Tb24 Generic drug: Upadacitinib ER       TAKE these medications    acetaminophen 500 MG tablet Commonly known as: TYLENOL Take 2 tablets (1,000 mg total)  by mouth every 6 (six) hours as needed.   aspirin EC 81 MG tablet Take 1 tablet (81 mg total) by mouth 2 (two) times daily. TO PREVENT BLOOD CLOTS   CALCIUM + D3 PO Take 1 tablet by mouth daily.   carboxymethylcellulose 0.5 % Soln Commonly known as: REFRESH PLUS Place 1 drop into both eyes as needed (dry eyes).   celecoxib 200 MG capsule Commonly known as: CeleBREX Take 1 capsule (200 mg total) by mouth 2 (two) times daily.   CENTRUM SILVER ULTRA WOMENS PO Take 1 tablet by mouth daily.   citalopram 20 MG tablet Commonly known as: CELEXA TAKE 1 TABLET(20 MG) BY MOUTH DAILY   ferrous sulfate 325 (65 FE) MG EC tablet Take 325 mg by mouth daily.   Fish Oil 1000 MG Caps Take 1,000 mg by mouth daily.   HEALTHY COLON PO Take 1 capsule by mouth daily.   lisinopril 10 MG tablet Commonly known as: ZESTRIL TAKE 1 TABLET(10 MG) BY MOUTH DAILY   oxyCODONE 5 MG immediate release tablet Commonly known as: Oxy IR/ROXICODONE Take one tab po q4-6hrs prn pain   Restasis 0.05 % ophthalmic emulsion Generic drug: cycloSPORINE Place 1 drop into both eyes 2 (two) times daily.   Systane Overnight Therapy 0.3 % Gel ophthalmic ointment Generic drug: hypromellose Place 1 Application into both eyes at bedtime.        FOLLOW UP VISIT:    Follow-up Information     [name], MD. Go on 04/17/2022.   Specialty: Orthopedic Surgery Why: Your appointment is scheduled for 4:00 Contact information: [address] [place] [address] [phone]         [facility] [place] Follow up.   Specialty: [place] Why: HHPT to provide 6 home visits prior to starting outpatient physical therapy Contact information: [address] [place] [address] [phone]         [facility], [place]. Go on 04/17/2022.   Why: Your outpatient physical therapy appointment is at 3:15. Please arrive at 3:00 to complete your  paperwork Contact information: [facility] [place] [address] [phone]                 DISPOSITION:   Home  CONDITION:  Stable   Chriss Czar, PA-C  04/03/2022 3:14 PM

## 2022-04-03 NOTE — Plan of Care (Signed)

## 2022-04-03 NOTE — Progress Notes (Signed)
Provided discharge education/instructions, all questions and concerns addressed. Pt not in acute distress, discharged home with belongings accompanied by daughter. ?

## 2022-04-03 NOTE — Progress Notes (Signed)
Occupational Therapy Treatment Patient Details Name: Rachel Austin MRN: [id number] DOB: 1956-01-09 Today's Date: 04/03/2022   History of present illness Pt is a 66 yo female presenting s/p L-THA, posterior approach without formal precautions on 03/31/22. PMH: Anemia, HLD, HTN, RA, syncope, R-THA 2020, bilateral knee surgeries   OT comments  Patient was educated on using AE for LB dressing to increase independence. Patient verbalized and demonstrated understanding. Patient would continue to benefit from skilled OT services at this time while admitted and after d/c to address noted deficits in order to improve overall safety and independence in ADLs.     Recommendations for follow up therapy are one component of a multi-disciplinary discharge planning process, led by the attending physician.  Recommendations may be updated based on patient status, additional functional criteria and insurance authorization.    Follow Up Recommendations  Follow physician's recommendations for discharge plan and follow up therapies     Assistance Recommended at Discharge Frequent or constant Supervision/Assistance  Patient can return home with the following  A little help with bathing/dressing/bathroom;Assistance with cooking/housework;Direct supervision/assist for medications management;Direct supervision/assist for financial management;Help with stairs or ramp for entrance;Assist for transportation   Equipment Recommendations       Recommendations for Other Services      Precautions / Restrictions Precautions Precautions: Fall Precaution Booklet Issued: No Precaution Comments: patient had posterior hip with no formal precautions per orders Restrictions Weight Bearing Restrictions: Yes LLE Weight Bearing: Weight bearing as tolerated       Mobility Bed Mobility                    Transfers                         Balance                                            ADL either performed or assessed with clinical judgement   ADL Overall ADL's : Needs assistance/impaired                       Lower Body Dressing Details (indicate cue type and reason): Patient was educated on using AE for LB dressing. patient needed min cues for participation in LB dressing with sock aid for proper positioning of sock on device prior to attempt to don. patient reported she likes using these items but woudl not be able to afford it.  patient reported she was unable to purchase these items for home seocndary to no budget. patient was provided with total hip kit at this time. Toilet Transfer: Supervision/safety;Ambulation;BSC/3in1;Regular Glass blower/designer Details (indicate cue type and reason): with increased time and noted short hop steps to bathroom. Toileting- Clothing Manipulation and Hygiene: Supervision/safety;Sit to/from stand              Extremity/Trunk Assessment              Vision       Perception     Praxis      Cognition Arousal/Alertness: Awake/alert Behavior During Therapy: WFL for tasks assessed/performed Overall Cognitive Status: Within Functional Limits for tasks assessed                                 General Comments:  translator used to ensure Microbiologist during Engineer, manufacturing systems Comments      Pertinent Vitals/ Pain       Pain Assessment Pain Assessment: No/denies pain  Home Living                                          Prior Functioning/Environment              Frequency  Min 2X/week        Progress Toward Goals  OT Goals(current goals can now be found in the care plan section)  Progress towards OT goals: Progressing toward goals     Plan Discharge plan remains appropriate    Co-evaluation                 AM-PAC OT "6 Clicks" Daily Activity     Outcome Measure   Help from  another person eating meals?: A Little Help from another person taking care of personal grooming?: A Little Help from another person toileting, which includes using toliet, bedpan, or urinal?: A Little Help from another person bathing (including washing, rinsing, drying)?: A Little Help from another person to put on and taking off regular upper body clothing?: A Little Help from another person to put on and taking off regular lower body clothing?: A Little (with AE) 6 Click Score: 18    End of Session Equipment Utilized During Treatment: Gait belt;Rolling walker (2 wheels)  OT Visit Diagnosis: Repeated falls (R29.6);Unsteadiness on feet (R26.81);Other abnormalities of gait and mobility (R26.89);Pain   Activity Tolerance Patient tolerated treatment well   Patient Left in chair;with call bell/phone within reach   Nurse Communication Mobility status        Time: 1333-1401 OT Time Calculation (min): 28 min  Charges: OT General Charges $OT Visit: 1 Visit OT Treatments $Self Care/Home Management : 23-37 mins  Rachel Plowman, MS Acute Rehabilitation Department Office# [phone]   Rachel Austin 04/03/2022, 3:10 PM

## 2022-04-03 NOTE — Progress Notes (Signed)
Physical Therapy Treatment Patient Details Name: Rachel Austin MRN: [id number] DOB: Dec 20, 1955 Today's Date: 04/03/2022   History of Present Illness Pt is a 66 yo female presenting s/p L-THA, posterior approach without formal precautions on 03/31/22. PMH: Anemia, HLD, HTN, RA, syncope, R-THA 2020, bilateral knee surgeries    PT Comments    Pt ambulated in hallway and practiced safe stair technique.  Pt reports understanding and ready for d/c home today.   Recommendations for follow up therapy are one component of a multi-disciplinary discharge planning process, led by the attending physician.  Recommendations may be updated based on patient status, additional functional criteria and insurance authorization.  Follow Up Recommendations  Follow physician's recommendations for discharge plan and follow up therapies     Assistance Recommended at Discharge Intermittent Supervision/Assistance  Patient can return home with the following A little help with walking and/or transfers;A little help with bathing/dressing/bathroom;Assistance with cooking/housework;Assist for transportation;Help with stairs or ramp for entrance   Equipment Recommendations  None recommended by PT    Recommendations for Other Services       Precautions / Restrictions Precautions Precautions: Fall Precaution Comments: patient had posterior hip with no formal precautions per orders Restrictions Weight Bearing Restrictions: Yes LLE Weight Bearing: Weight bearing as tolerated     Mobility  Bed Mobility Overal bed mobility: Needs Assistance Bed Mobility: Supine to Sit     Supine to sit: Min assist          Transfers Overall transfer level: Needs assistance Equipment used: Rolling walker (2 wheels) Transfers: Sit to/from Stand Sit to Stand: Supervision           General transfer comment: cues for proper hand placement    Ambulation/Gait Ambulation/Gait assistance: Min guard, Supervision Gait  Distance (Feet): 160 Feet Assistive device: Rolling walker (2 wheels) Gait Pattern/deviations: Step-to pattern, Decreased stance time - left, Antalgic       General Gait Details: steady gait with RW, no LOB observed, tolerated improved distance today   Stairs Stairs: Yes Stairs assistance: Min guard Stair Management: Step to pattern, Forwards, Two rails Number of Stairs: 2 General stair comments: cues for sequence and safety; pt reports understanding   Wheelchair Mobility    Modified Rankin (Stroke Patients Only)       Balance                                            Cognition Arousal/Alertness: Awake/alert Behavior During Therapy: WFL for tasks assessed/performed Overall Cognitive Status: Within Functional Limits for tasks assessed                                          Exercises      General Comments        Pertinent Vitals/Pain Pain Assessment Pain Assessment: No/denies pain Pain Location: "no pain" Pain Intervention(s): Repositioned, Monitored during session    Home Living                          Prior Function            PT Goals (current goals can now be found in the care plan section) Progress towards PT goals: Progressing toward goals    Frequency    7X/week  PT Plan Current plan remains appropriate    Co-evaluation              AM-PAC PT "6 Clicks" Mobility   Outcome Measure  Help needed turning from your back to your side while in a flat bed without using bedrails?: None Help needed moving from lying on your back to sitting on the side of a flat bed without using bedrails?: A Little Help needed moving to and from a bed to a chair (including a wheelchair)?: A Little Help needed standing up from a chair using your arms (e.g., wheelchair or bedside chair)?: A Little Help needed to walk in hospital room?: A Little Help needed climbing 3-5 steps with a railing? : A Little 6  Click Score: 19    End of Session Equipment Utilized During Treatment: Gait belt Activity Tolerance: Patient tolerated treatment well;No increased pain Patient left: with call bell/phone within reach;in chair;with chair alarm set   PT Visit Diagnosis: Difficulty in walking, not elsewhere classified (R26.2)     Time: 7035-0093 PT Time Calculation (min) (ACUTE ONLY): 16 min  Charges:  $Gait Training: 8-22 mins                    Arlyce Dice, DPT Physical Therapist Acute Rehabilitation Services Preferred contact method: Secure Chat Weekend Pager Only: [phone] Office: [place] 04/03/2022, 11:32 AM

## 2022-04-04 DIAGNOSIS — D509 Iron deficiency anemia, unspecified: Secondary | ICD-10-CM | POA: Diagnosis not present

## 2022-04-04 DIAGNOSIS — Z7982 Long term (current) use of aspirin: Secondary | ICD-10-CM | POA: Diagnosis not present

## 2022-04-04 DIAGNOSIS — Z79891 Long term (current) use of opiate analgesic: Secondary | ICD-10-CM | POA: Diagnosis not present

## 2022-04-04 DIAGNOSIS — Z96642 Presence of left artificial hip joint: Secondary | ICD-10-CM | POA: Diagnosis not present

## 2022-04-04 DIAGNOSIS — Z471 Aftercare following joint replacement surgery: Secondary | ICD-10-CM | POA: Diagnosis not present

## 2022-04-04 DIAGNOSIS — E785 Hyperlipidemia, unspecified: Secondary | ICD-10-CM | POA: Diagnosis not present

## 2022-04-04 DIAGNOSIS — Z96653 Presence of artificial knee joint, bilateral: Secondary | ICD-10-CM | POA: Diagnosis not present

## 2022-04-04 DIAGNOSIS — Z96641 Presence of right artificial hip joint: Secondary | ICD-10-CM | POA: Diagnosis not present

## 2022-04-04 DIAGNOSIS — I10 Essential (primary) hypertension: Secondary | ICD-10-CM | POA: Diagnosis not present

## 2022-04-04 DIAGNOSIS — M069 Rheumatoid arthritis, unspecified: Secondary | ICD-10-CM | POA: Diagnosis not present

## 2022-04-04 DIAGNOSIS — Z9181 History of falling: Secondary | ICD-10-CM | POA: Diagnosis not present

## 2022-04-04 DIAGNOSIS — Z791 Long term (current) use of non-steroidal anti-inflammatories (NSAID): Secondary | ICD-10-CM | POA: Diagnosis not present

## 2022-04-04 DIAGNOSIS — H269 Unspecified cataract: Secondary | ICD-10-CM | POA: Diagnosis not present

## 2022-04-05 ENCOUNTER — Telehealth: Payer: Self-pay

## 2022-04-05 NOTE — Patient Outreach (Signed)
  Care Coordination TOC Note Transition Care Management Unsuccessful Follow-up Telephone Call  Date of discharge and from where:  04/03/22-[facility]  Attempts:  1st Attempt  Reason for unsuccessful TCM follow-up call:  No answer/busy   Enzo Montgomery, RN,BSN,CCM [place] [facility] Telephonic Care Management Coordinator Direct Phone: [phone] Toll Free: [phone] Fax: [phone]

## 2022-04-06 ENCOUNTER — Telehealth: Payer: Self-pay

## 2022-04-06 DIAGNOSIS — H269 Unspecified cataract: Secondary | ICD-10-CM | POA: Diagnosis not present

## 2022-04-06 DIAGNOSIS — Z96641 Presence of right artificial hip joint: Secondary | ICD-10-CM | POA: Diagnosis not present

## 2022-04-06 DIAGNOSIS — I10 Essential (primary) hypertension: Secondary | ICD-10-CM | POA: Diagnosis not present

## 2022-04-06 DIAGNOSIS — Z96653 Presence of artificial knee joint, bilateral: Secondary | ICD-10-CM | POA: Diagnosis not present

## 2022-04-06 DIAGNOSIS — Z791 Long term (current) use of non-steroidal anti-inflammatories (NSAID): Secondary | ICD-10-CM | POA: Diagnosis not present

## 2022-04-06 DIAGNOSIS — D509 Iron deficiency anemia, unspecified: Secondary | ICD-10-CM | POA: Diagnosis not present

## 2022-04-06 DIAGNOSIS — Z7982 Long term (current) use of aspirin: Secondary | ICD-10-CM | POA: Diagnosis not present

## 2022-04-06 DIAGNOSIS — E785 Hyperlipidemia, unspecified: Secondary | ICD-10-CM | POA: Diagnosis not present

## 2022-04-06 DIAGNOSIS — M069 Rheumatoid arthritis, unspecified: Secondary | ICD-10-CM | POA: Diagnosis not present

## 2022-04-06 DIAGNOSIS — Z96642 Presence of left artificial hip joint: Secondary | ICD-10-CM | POA: Diagnosis not present

## 2022-04-06 DIAGNOSIS — Z9181 History of falling: Secondary | ICD-10-CM | POA: Diagnosis not present

## 2022-04-06 DIAGNOSIS — Z79891 Long term (current) use of opiate analgesic: Secondary | ICD-10-CM | POA: Diagnosis not present

## 2022-04-06 DIAGNOSIS — Z471 Aftercare following joint replacement surgery: Secondary | ICD-10-CM | POA: Diagnosis not present

## 2022-04-06 NOTE — Patient Outreach (Signed)
  Care Coordination TOC Note Transition Care Management Follow-up Telephone Call Date of discharge and from where: 04/03/2022-[facility]  How have you been since you were released from the hospital? Call completed with daughter/caregiver-Michelle. She reports patient is doing okay-has good and bad days. Therapist currently in the home and working with patient.  Any questions or concerns? No  Items Reviewed: Did the pt receive and understand the discharge instructions provided? Yes  Medications obtained and verified?  Unable to review med-daughter currently not in the home Other? Yes  Any new allergies since your discharge? No  Dietary orders reviewed? Yes Do you have support at home? Yes   Home Care and Equipment/Supplies: Were home health services ordered? yes If so, what is the name of the agency? [facility]  Has the agency set up a time to come to the patient's home? yes Were any new equipment or medical supplies ordered?  No What is the name of the medical supply agency? N/A Were you able to get the supplies/equipment? not applicable Do you have any questions related to the use of the equipment or supplies? No  Functional Questionnaire: (I = Independent and D = Dependent) ADLs: A  Bathing/Dressing- A  Meal Prep- A  Eating- I  Maintaining continence- I  Transferring/Ambulation- A  Managing Meds- A  Follow up appointments reviewed:  PCP Hospital f/u appt confirmed? Unable to confirm-call got disconnected and unable to reach caregiver back. [facility] f/u appt confirmed?  Unable to confirm-call got disconnected and unable to reach caregiver back . Are transportation arrangements needed? No  If their condition worsens, is the pt aware to call PCP or go to the Emergency Dept.? Yes Was the patient provided with contact information for the PCP's office or ED? Yes Was to pt encouraged to call back with questions or concerns? Yes  SDOH assessments and  interventions completed:   Yes  Care Coordination Interventions Activated:  Yes   Care Coordination Interventions:  Education provided    Encounter Outcome:  Pt. Visit Completed    Enzo Montgomery, RN,BSN,CCM [place] [facility] Telephonic Care Management Coordinator Direct Phone: [phone] Toll Free: [phone] Fax: [phone]

## 2022-04-06 NOTE — Patient Outreach (Signed)
  Care Coordination TOC Note Transition Care Management Unsuccessful Follow-up Telephone Call  Date of discharge and from where:  04/03/22-[facility]  Attempts:  2nd Attempt  Reason for unsuccessful TCM follow-up call:  Unable to reach patient     Hetty Blend [place] [facility] Telephonic Care Management Coordinator Direct Phone: [phone] Toll Free: [phone] Fax: [phone]

## 2022-04-07 DIAGNOSIS — I10 Essential (primary) hypertension: Secondary | ICD-10-CM | POA: Diagnosis not present

## 2022-04-07 DIAGNOSIS — Z791 Long term (current) use of non-steroidal anti-inflammatories (NSAID): Secondary | ICD-10-CM | POA: Diagnosis not present

## 2022-04-07 DIAGNOSIS — Z96642 Presence of left artificial hip joint: Secondary | ICD-10-CM | POA: Diagnosis not present

## 2022-04-07 DIAGNOSIS — Z79891 Long term (current) use of opiate analgesic: Secondary | ICD-10-CM | POA: Diagnosis not present

## 2022-04-07 DIAGNOSIS — Z7982 Long term (current) use of aspirin: Secondary | ICD-10-CM | POA: Diagnosis not present

## 2022-04-07 DIAGNOSIS — Z9181 History of falling: Secondary | ICD-10-CM | POA: Diagnosis not present

## 2022-04-07 DIAGNOSIS — Z96641 Presence of right artificial hip joint: Secondary | ICD-10-CM | POA: Diagnosis not present

## 2022-04-07 DIAGNOSIS — H269 Unspecified cataract: Secondary | ICD-10-CM | POA: Diagnosis not present

## 2022-04-07 DIAGNOSIS — M069 Rheumatoid arthritis, unspecified: Secondary | ICD-10-CM | POA: Diagnosis not present

## 2022-04-07 DIAGNOSIS — E785 Hyperlipidemia, unspecified: Secondary | ICD-10-CM | POA: Diagnosis not present

## 2022-04-07 DIAGNOSIS — Z471 Aftercare following joint replacement surgery: Secondary | ICD-10-CM | POA: Diagnosis not present

## 2022-04-07 DIAGNOSIS — Z96653 Presence of artificial knee joint, bilateral: Secondary | ICD-10-CM | POA: Diagnosis not present

## 2022-04-07 DIAGNOSIS — D509 Iron deficiency anemia, unspecified: Secondary | ICD-10-CM | POA: Diagnosis not present

## 2022-04-07 LAB — TRANSFUSION REACTION
DAT C3: NEGATIVE
Post RXN DAT IgG: NEGATIVE

## 2022-04-10 DIAGNOSIS — I10 Essential (primary) hypertension: Secondary | ICD-10-CM | POA: Diagnosis not present

## 2022-04-10 DIAGNOSIS — Z79891 Long term (current) use of opiate analgesic: Secondary | ICD-10-CM | POA: Diagnosis not present

## 2022-04-10 DIAGNOSIS — Z471 Aftercare following joint replacement surgery: Secondary | ICD-10-CM | POA: Diagnosis not present

## 2022-04-10 DIAGNOSIS — Z96641 Presence of right artificial hip joint: Secondary | ICD-10-CM | POA: Diagnosis not present

## 2022-04-10 DIAGNOSIS — M069 Rheumatoid arthritis, unspecified: Secondary | ICD-10-CM | POA: Diagnosis not present

## 2022-04-10 DIAGNOSIS — D509 Iron deficiency anemia, unspecified: Secondary | ICD-10-CM | POA: Diagnosis not present

## 2022-04-10 DIAGNOSIS — H269 Unspecified cataract: Secondary | ICD-10-CM | POA: Diagnosis not present

## 2022-04-10 DIAGNOSIS — Z96653 Presence of artificial knee joint, bilateral: Secondary | ICD-10-CM | POA: Diagnosis not present

## 2022-04-10 DIAGNOSIS — Z96642 Presence of left artificial hip joint: Secondary | ICD-10-CM | POA: Diagnosis not present

## 2022-04-10 DIAGNOSIS — Z9181 History of falling: Secondary | ICD-10-CM | POA: Diagnosis not present

## 2022-04-10 DIAGNOSIS — Z791 Long term (current) use of non-steroidal anti-inflammatories (NSAID): Secondary | ICD-10-CM | POA: Diagnosis not present

## 2022-04-10 DIAGNOSIS — E785 Hyperlipidemia, unspecified: Secondary | ICD-10-CM | POA: Diagnosis not present

## 2022-04-10 DIAGNOSIS — Z7982 Long term (current) use of aspirin: Secondary | ICD-10-CM | POA: Diagnosis not present

## 2022-04-12 ENCOUNTER — Encounter: Payer: Self-pay | Admitting: Gastroenterology

## 2022-04-14 DIAGNOSIS — M069 Rheumatoid arthritis, unspecified: Secondary | ICD-10-CM | POA: Diagnosis not present

## 2022-04-14 DIAGNOSIS — Z471 Aftercare following joint replacement surgery: Secondary | ICD-10-CM | POA: Diagnosis not present

## 2022-04-14 DIAGNOSIS — Z791 Long term (current) use of non-steroidal anti-inflammatories (NSAID): Secondary | ICD-10-CM | POA: Diagnosis not present

## 2022-04-14 DIAGNOSIS — Z7982 Long term (current) use of aspirin: Secondary | ICD-10-CM | POA: Diagnosis not present

## 2022-04-14 DIAGNOSIS — E785 Hyperlipidemia, unspecified: Secondary | ICD-10-CM | POA: Diagnosis not present

## 2022-04-14 DIAGNOSIS — H269 Unspecified cataract: Secondary | ICD-10-CM | POA: Diagnosis not present

## 2022-04-14 DIAGNOSIS — Z96653 Presence of artificial knee joint, bilateral: Secondary | ICD-10-CM | POA: Diagnosis not present

## 2022-04-14 DIAGNOSIS — Z9181 History of falling: Secondary | ICD-10-CM | POA: Diagnosis not present

## 2022-04-14 DIAGNOSIS — I10 Essential (primary) hypertension: Secondary | ICD-10-CM | POA: Diagnosis not present

## 2022-04-14 DIAGNOSIS — Z96641 Presence of right artificial hip joint: Secondary | ICD-10-CM | POA: Diagnosis not present

## 2022-04-14 DIAGNOSIS — D509 Iron deficiency anemia, unspecified: Secondary | ICD-10-CM | POA: Diagnosis not present

## 2022-04-14 DIAGNOSIS — Z79891 Long term (current) use of opiate analgesic: Secondary | ICD-10-CM | POA: Diagnosis not present

## 2022-04-14 DIAGNOSIS — Z96642 Presence of left artificial hip joint: Secondary | ICD-10-CM | POA: Diagnosis not present

## 2022-04-18 DIAGNOSIS — M1612 Unilateral primary osteoarthritis, left hip: Secondary | ICD-10-CM | POA: Diagnosis not present

## 2022-05-30 ENCOUNTER — Telehealth: Payer: Medicare Other | Admitting: Family Medicine

## 2022-05-30 ENCOUNTER — Encounter: Payer: Self-pay | Admitting: Family Medicine

## 2022-05-30 DIAGNOSIS — Z538 Procedure and treatment not carried out for other reasons: Secondary | ICD-10-CM

## 2022-05-30 NOTE — Progress Notes (Signed)
Per Nurse assistant patient requested to reschedule on check in. Prefers to do with PCP only.

## 2022-07-02 ENCOUNTER — Other Ambulatory Visit: Payer: Self-pay | Admitting: Adult Health

## 2022-07-02 DIAGNOSIS — F329 Major depressive disorder, single episode, unspecified: Secondary | ICD-10-CM

## 2022-07-02 DIAGNOSIS — I10 Essential (primary) hypertension: Secondary | ICD-10-CM

## 2022-09-12 DIAGNOSIS — M0579 Rheumatoid arthritis with rheumatoid factor of multiple sites without organ or systems involvement: Secondary | ICD-10-CM | POA: Diagnosis not present

## 2022-09-12 DIAGNOSIS — Z79899 Other long term (current) drug therapy: Secondary | ICD-10-CM | POA: Diagnosis not present

## 2022-09-12 DIAGNOSIS — M25552 Pain in left hip: Secondary | ICD-10-CM | POA: Diagnosis not present

## 2022-09-12 DIAGNOSIS — M79643 Pain in unspecified hand: Secondary | ICD-10-CM | POA: Diagnosis not present

## 2022-09-12 DIAGNOSIS — H16221 Keratoconjunctivitis sicca, not specified as Sjogren's, right eye: Secondary | ICD-10-CM | POA: Diagnosis not present

## 2022-09-12 DIAGNOSIS — M1990 Unspecified osteoarthritis, unspecified site: Secondary | ICD-10-CM | POA: Diagnosis not present

## 2022-09-14 DIAGNOSIS — M1612 Unilateral primary osteoarthritis, left hip: Secondary | ICD-10-CM | POA: Diagnosis not present

## 2022-09-25 DIAGNOSIS — H0102A Squamous blepharitis right eye, upper and lower eyelids: Secondary | ICD-10-CM | POA: Diagnosis not present

## 2022-09-25 DIAGNOSIS — H0102B Squamous blepharitis left eye, upper and lower eyelids: Secondary | ICD-10-CM | POA: Diagnosis not present

## 2022-09-25 DIAGNOSIS — H40013 Open angle with borderline findings, low risk, bilateral: Secondary | ICD-10-CM | POA: Diagnosis not present

## 2022-09-25 DIAGNOSIS — H17823 Peripheral opacity of cornea, bilateral: Secondary | ICD-10-CM | POA: Diagnosis not present

## 2022-09-25 DIAGNOSIS — H2513 Age-related nuclear cataract, bilateral: Secondary | ICD-10-CM | POA: Diagnosis not present

## 2022-09-29 ENCOUNTER — Encounter: Payer: Self-pay | Admitting: Adult Health

## 2022-09-29 ENCOUNTER — Ambulatory Visit (INDEPENDENT_AMBULATORY_CARE_PROVIDER_SITE_OTHER): Payer: Medicare Other | Admitting: Adult Health

## 2022-09-29 ENCOUNTER — Other Ambulatory Visit: Payer: Self-pay | Admitting: Adult Health

## 2022-09-29 VITALS — BP 110/60 | HR 46 | Temp 97.6°F | Ht 60.0 in | Wt 141.0 lb

## 2022-09-29 DIAGNOSIS — E782 Mixed hyperlipidemia: Secondary | ICD-10-CM | POA: Diagnosis not present

## 2022-09-29 DIAGNOSIS — F329 Major depressive disorder, single episode, unspecified: Secondary | ICD-10-CM | POA: Diagnosis not present

## 2022-09-29 DIAGNOSIS — Z23 Encounter for immunization: Secondary | ICD-10-CM | POA: Diagnosis not present

## 2022-09-29 DIAGNOSIS — M05721 Rheumatoid arthritis with rheumatoid factor of right elbow without organ or systems involvement: Secondary | ICD-10-CM

## 2022-09-29 DIAGNOSIS — Z1231 Encounter for screening mammogram for malignant neoplasm of breast: Secondary | ICD-10-CM

## 2022-09-29 DIAGNOSIS — Z1211 Encounter for screening for malignant neoplasm of colon: Secondary | ICD-10-CM

## 2022-09-29 DIAGNOSIS — I10 Essential (primary) hypertension: Secondary | ICD-10-CM

## 2022-09-29 DIAGNOSIS — E2839 Other primary ovarian failure: Secondary | ICD-10-CM

## 2022-09-29 DIAGNOSIS — D508 Other iron deficiency anemias: Secondary | ICD-10-CM

## 2022-09-29 DIAGNOSIS — Z0000 Encounter for general adult medical examination without abnormal findings: Secondary | ICD-10-CM

## 2022-09-29 LAB — COMPREHENSIVE METABOLIC PANEL
ALT: 18 U/L (ref 0–35)
AST: 29 U/L (ref 0–37)
Albumin: 4.7 g/dL (ref 3.5–5.2)
Alkaline Phosphatase: 54 U/L (ref 39–117)
BUN: 19 mg/dL (ref 6–23)
CO2: 27 mEq/L (ref 19–32)
Calcium: 9.6 mg/dL (ref 8.4–10.5)
Chloride: 105 mEq/L (ref 96–112)
Creatinine, Ser: 0.63 mg/dL (ref 0.40–1.20)
GFR: 92.69 mL/min (ref >60.00)
Glucose, Bld: 85 mg/dL (ref 70–99)
Potassium: 4.1 mEq/L (ref 3.5–5.1)
Sodium: 140 mEq/L (ref 135–145)
Total Bilirubin: 0.6 mg/dL (ref 0.2–1.2)
Total Protein: 7.7 g/dL (ref 6.0–8.3)

## 2022-09-29 LAB — CBC WITH DIFFERENTIAL/PLATELET
Basophils Absolute: 0 10*3/uL (ref 0.0–0.1)
Basophils Relative: 0.5 % (ref 0.0–3.0)
Eosinophils Absolute: 0.1 10*3/uL (ref 0.0–0.7)
Eosinophils Relative: 2.5 % (ref 0.0–5.0)
HCT: 35.8 % — ABNORMAL LOW (ref 36.0–46.0)
Hemoglobin: 11.8 g/dL — ABNORMAL LOW (ref 12.0–15.0)
Lymphocytes Relative: 60 % — ABNORMAL HIGH (ref 12.0–46.0)
Lymphs Abs: 3.2 10*3/uL (ref 0.7–4.0)
MCHC: 33 g/dL (ref 30.0–36.0)
MCV: 94.3 fl (ref 78.0–100.0)
Monocytes Absolute: 0.4 10*3/uL (ref 0.1–1.0)
Monocytes Relative: 8.2 % (ref 3.0–12.0)
Neutro Abs: 1.5 10*3/uL (ref 1.4–7.7)
Neutrophils Relative %: 28.8 % — ABNORMAL LOW (ref 43.0–77.0)
Platelets: 317 10*3/uL (ref 150.0–400.0)
RBC: 3.8 Mil/uL — ABNORMAL LOW (ref 3.87–5.11)
RDW: 15.6 % — ABNORMAL HIGH (ref 11.5–15.5)
WBC: 5.3 10*3/uL (ref 4.0–10.5)

## 2022-09-29 LAB — TSH: TSH: 2.66 u[IU]/mL (ref 0.35–5.50)

## 2022-09-29 LAB — LIPID PANEL
Cholesterol: 319 mg/dL — ABNORMAL HIGH (ref 0–200)
HDL: 67.8 mg/dL (ref >39.00)
LDL Cholesterol: 229 mg/dL — ABNORMAL HIGH (ref 0–99)
NonHDL: 251.14
Total CHOL/HDL Ratio: 5
Triglycerides: 111 mg/dL (ref 0.0–149.0)
VLDL: 22.2 mg/dL (ref 0.0–40.0)

## 2022-09-29 LAB — IBC + FERRITIN
Ferritin: 59.1 ng/mL (ref 10.0–291.0)
Iron: 87 ug/dL (ref 42–145)
Saturation Ratios: 24 % (ref 20.0–50.0)
TIBC: 362.6 ug/dL (ref 250.0–450.0)
Transferrin: 259 mg/dL (ref 212.0–360.0)

## 2022-09-29 MED ORDER — LISINOPRIL 10 MG PO TABS: ORAL_TABLET | ORAL | 3 refills | Status: DC

## 2022-09-29 NOTE — Patient Instructions (Signed)
It was great seeing you today   We will follow up with you regarding your lab work   Please let me know if you need anything   I have referred you for a colonoscopy, mammogram, and bone density screen   Let me know if you need anything

## 2022-09-29 NOTE — Progress Notes (Signed)
Subjective:    Patient ID: Rachel Austin, female    DOB: [birth year], 67 y.o.   MRN: [id number]  HPI Patient presents for yearly preventative medicine examination. She is a pleasant 67 year old female who  has a past medical history of Acute blood loss anemia (06/01/2018), Anemia, Anemia, iron deficiency (06/01/2018), Cataract (2018), Depression, Hyperlipidemia, Hypertension, Rheumatoid arthritis (HCC), and Syncope.  Essential Hypertension-takes lisinopril 10 mg daily.   She denies episodes of dizziness, lightheadedness, chest pain, or shortness of breath BP Readings from Last 3 Encounters:  09/29/22 110/60  04/03/22 117/72  03/21/22 (!) 145/81   Depression -is been well controlled on Celexa 20 mg in the past.  She continues to feel as though her depression symptoms are well controlled on this dose.  Denies suicidal ideation  RA -is followed by rheumatology, currently on Rinvoq . Since going on Rinvoq she has had significant improvement in her symptoms.   Iron Deficiency Anemia - takes OTC iron supplement  Lab Results  Component Value Date   IRON 42 08/26/2020   FERRITIN 78.5 08/26/2020   Hyperlipidemia - not currently on medication  Lab Results  Component Value Date   CHOL 207 (H) 08/26/2020   HDL 54.90 08/26/2020   LDLCALC 130 (H) 08/26/2020   TRIG 112.0 08/26/2020   CHOLHDL 4 08/26/2020   All immunizations and health maintenance protocols were reviewed with the patient and needed orders were placed.  Appropriate screening laboratory values were ordered for the patient including screening of hyperlipidemia, renal function and hepatic function.   Medication reconciliation,  past medical history, social history, problem list and allergies were reviewed in detail with the patient  Goals were established with regard to weight loss, exercise, and  diet in compliance with medications  She is overdue for routine mammogram, 5-year follow-up colonoscopy, and bone density  screen.   Review of Systems  Constitutional: Negative.   HENT: Negative.    Eyes: Negative.   Respiratory: Negative.    Cardiovascular: Negative.   Gastrointestinal: Negative.   Endocrine: Negative.   Genitourinary: Negative.   Musculoskeletal: Negative.   Skin: Negative.   Allergic/Immunologic: Negative.   Neurological: Negative.   Hematological: Negative.   Psychiatric/Behavioral: Negative.     Past Medical History:  Diagnosis Date   Acute blood loss anemia 06/01/2018   Anemia    Anemia, iron deficiency 06/01/2018   Cataract 2018   small, bilateral   Depression    Hyperlipidemia    Hypertension    Rheumatoid arthritis (HCC)    on meds   Syncope     Social History   Socioeconomic History   Marital status: Single    Spouse name: Not on file   Number of children: Not on file   Years of education: Not on file   Highest education level: Not on file  Occupational History   Not on file  Tobacco Use   Smoking status: Never   Smokeless tobacco: Never  Vaping Use   Vaping Use: Never used  Substance and Sexual Activity   Alcohol use: No    Alcohol/week: 0.0 standard drinks of alcohol   Drug use: No   Sexual activity: Never  Other Topics Concern   Not on file  Social History Narrative   Moved from [place] to [place] in December   Social Determinants of Health   Financial Resource Strain: Low Risk  (05/05/2021)   Overall Financial Resource Strain (CARDIA)    Difficulty of Paying Living Expenses: Not hard  at all  Food Insecurity: No Food Insecurity (03/31/2022)   Hunger Vital Sign    Worried About Running Out of Food in the Last Year: Never true    Ran Out of Food in the Last Year: Never true  Recent Concern: Food Insecurity - Food Insecurity Present (02/09/2022)   Hunger Vital Sign    Worried About Running Out of Food in the Last Year: Sometimes true    Ran Out of Food in the Last Year: Sometimes true  Transportation Needs: No Transportation Needs (03/31/2022)    PRAPARE - Administrator, Civil Service (Medical): No    Lack of Transportation (Non-Medical): No  Physical Activity: Inactive (05/05/2021)   Exercise Vital Sign    Days of Exercise per Week: 0 days    Minutes of Exercise per Session: 0 min  Stress: No Stress Concern Present (05/05/2021)   [employer] of Occupational Health - Occupational Stress Questionnaire    Feeling of Stress : Only a little  Social Connections: Socially Isolated (05/05/2021)   Social Connection and Isolation Panel [NHANES]    Frequency of Communication with Friends and Family: More than three times a week    Frequency of Social Gatherings with Friends and Family: Never    Attends Religious Services: Never    Database administrator or Organizations: No    Attends Banker Meetings: Never    Marital Status: Never married  Intimate Partner Violence: Not At Risk (03/31/2022)   Humiliation, Afraid, Rape, and Austin questionnaire    Fear of Current or Ex-Partner: No    Emotionally Abused: No    Physically Abused: No    Sexually Abused: No    Past Surgical History:  Procedure Laterality Date   FINGER SURGERY  01/29/2017   last 2 fingers left hand   FOOT SURGERY  2016   Bil/ foot reconstruction due to RA   KNEE SURGERY Bilateral    Right knee-2010, Left knee -2006   TOTAL HIP ARTHROPLASTY 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/2018   Procedure: TOTAL HIP ARTHROPLASTY;  Surgeon: Frederico Hamman, MD;  Location: [facility];  Service: Orthopedics;  Laterality: Right;   TOTAL HIP ARTHROPLASTY Left 03/31/2022   Procedure: TOTAL HIP ARTHROPLASTY;  Surgeon: Frederico Hamman, MD;  Location: [facility];  Service: Orthopedics;  Laterality: Left;   VARICOSE VEIN SURGERY  2015   right     Family History  Problem Relation Age of Onset   Heart disease Mother        pacemaker, hypertensin    No Known Allergies  Current Outpatient Medications on File Prior to Visit  Medication Sig Dispense Refill   acetaminophen (TYLENOL) 500  MG tablet Take 2 tablets (1,000 mg total) by mouth every 6 (six) hours as needed. 100 tablet 2   Calcium Carb-Cholecalciferol (CALCIUM + D3 PO) Take 1 tablet by mouth daily.     carboxymethylcellulose (REFRESH PLUS) 0.5 % SOLN Place 1 drop into both eyes as needed (dry eyes).     celecoxib (CELEBREX) 200 MG capsule Take 1 capsule (200 mg total) by mouth 2 (two) times daily. 60 capsule 0   citalopram (CELEXA) 20 MG tablet TAKE 1 TABLET(20 MG) BY MOUTH DAILY 90 tablet 1   ferrous sulfate 325 (65 FE) MG EC tablet Take 325 mg by mouth daily.     hypromellose (SYSTANE OVERNIGHT THERAPY) 0.3 % GEL ophthalmic ointment Place 1 Application into both eyes at bedtime.     lisinopril (ZESTRIL) 10 MG tablet TAKE 1  TABLET(10 MG) BY MOUTH DAILY 90 tablet 0   Multiple Vitamins-Minerals (CENTRUM SILVER ULTRA WOMENS PO) Take 1 tablet by mouth daily.      Omega-3 Fatty Acids (FISH OIL) 1000 MG CAPS Take 1,000 mg by mouth daily.     oxyCODONE (OXY IR/ROXICODONE) 5 MG immediate release tablet Take one tab po q4-6hrs prn pain 40 tablet 0   Probiotic Product (HEALTHY COLON PO) Take 1 capsule by mouth daily.     RESTASIS 0.05 % ophthalmic emulsion Place 1 drop into both eyes 2 (two) times daily.     No current facility-administered medications on file prior to visit.    BP 110/60   Pulse (!) 46   Temp 97.6 F (36.4 C) (Oral)   Ht 5' (1.524 m)   Wt 141 lb (64 kg)   SpO2 94%   BMI 27.54 kg/m       Objective:   Physical Exam Vitals and nursing note reviewed.  Constitutional:      General: She is not in acute distress.    Appearance: Normal appearance. She is not ill-appearing.  HENT:     Head: Normocephalic and atraumatic.     Right Ear: Tympanic membrane, ear canal and external ear normal. There is no impacted cerumen.     Left Ear: Tympanic membrane, ear canal and external ear normal. There is no impacted cerumen.     Nose: Nose normal. No congestion or rhinorrhea.     Mouth/Throat:     Mouth: Mucous  membranes are moist.     Pharynx: Oropharynx is clear.  Eyes:     Extraocular Movements: Extraocular movements intact.     Conjunctiva/sclera: Conjunctivae normal.     Pupils: Pupils are equal, round, and reactive to light.  Neck:     Vascular: No carotid bruit.  Cardiovascular:     Rate and Rhythm: Normal rate and regular rhythm.     Pulses: Normal pulses.     Heart sounds: No murmur heard.    No friction rub. No gallop.  Pulmonary:     Effort: Pulmonary effort is normal.     Breath sounds: Normal breath sounds.  Abdominal:     General: Abdomen is flat. Bowel sounds are normal. There is no distension.     Palpations: Abdomen is soft. There is no mass.     Tenderness: There is no abdominal tenderness. There is no guarding or rebound.     Hernia: No hernia is present.  Musculoskeletal:        General: Deformity (chronic deformities in bilateral hand) present. Normal range of motion.     Cervical back: Normal range of motion and neck supple.  Lymphadenopathy:     Cervical: No cervical adenopathy.  Skin:    General: Skin is warm and dry.     Capillary Refill: Capillary refill takes less than 2 seconds.  Neurological:     General: No focal deficit present.     Mental Status: She is alert and oriented to person, place, and time.  Psychiatric:        Mood and Affect: Mood normal.        Behavior: Behavior normal.        Thought Content: Thought content normal.        Judgment: Judgment normal.       Assessment & Plan:  1. Routine general medical examination at a health care facility Today patient counseled on age appropriate routine health concerns for screening and prevention, each  reviewed and up to date or declined. Immunizations reviewed and up to date or declined. Labs ordered and reviewed. Risk factors for depression reviewed and negative. Hearing function and visual acuity are intact. ADLs screened and addressed as needed. Functional ability and level of safety reviewed and  appropriate. Education, counseling and referrals performed based on assessed risks today. Patient provided with a copy of personalized plan for preventive services. - Pneumonia vaccination given today  - Advised shingles vaccination at pharmacy  - Follow up in one year   2. Essential hypertension - Well controlled. No change in medication  - CBC with Differential/Platelet; Future - Comprehensive metabolic panel; Future - Lipid panel; Future - TSH; Future - lisinopril (ZESTRIL) 10 MG tablet; TAKE 1 TABLET(10 MG) BY MOUTH DAILY  Dispense: 90 tablet; Refill: 3  3. Reactive depression - Continue Celexa. Well controlled.  - CBC with Differential/Platelet; Future - Comprehensive metabolic panel; Future - Lipid panel; Future - TSH; Future  4. Other iron deficiency anemia  - IBC + Ferritin; Future  5. 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 Mixed hyperlipidemia - Consider statin  - CBC with Differential/Platelet; Future - Comprehensive metabolic panel; Future - Lipid panel; Future - TSH; Future  7. Colon cancer screening  - Ambulatory referral to Gastroenterology  8. Screening mammogram for breast cancer  - MM 3D SCREENING MAMMOGRAM BILATERAL BREAST; Future  9. Estrogen deficiency  - DG Bone Density; Future  Shirline Frees, NP

## 2022-09-30 ENCOUNTER — Other Ambulatory Visit: Payer: Self-pay | Admitting: Adult Health

## 2022-09-30 DIAGNOSIS — I10 Essential (primary) hypertension: Secondary | ICD-10-CM

## 2022-10-03 ENCOUNTER — Other Ambulatory Visit: Payer: Self-pay

## 2022-10-03 MED ORDER — ATORVASTATIN CALCIUM 10 MG PO TABS: 10.0000 mg | ORAL_TABLET | Freq: Every day | ORAL | 0 refills | Status: DC

## 2022-10-13 ENCOUNTER — Ambulatory Visit
Admission: RE | Admit: 2022-10-13 | Discharge: 2022-10-13 | Disposition: A | Discharge status: Home / Self Care | Payer: Medicare Other | Source: Ambulatory Visit | Attending: Adult Health | Admitting: Adult Health

## 2022-10-13 DIAGNOSIS — Z1231 Encounter for screening mammogram for malignant neoplasm of breast: Secondary | ICD-10-CM

## 2022-11-24 ENCOUNTER — Telehealth: Payer: Self-pay | Admitting: *Deleted

## 2022-11-24 ENCOUNTER — Ambulatory Visit: Payer: Medicare Other

## 2022-11-24 NOTE — Progress Notes (Deleted)
Pt's name and DOB verified at the beginning of the pre-visit with assistance of Spanish interpreter Interpreters name is : Pt denies any difficulty with ambulating,sitting, laying down or rolling side to side Gave both [facility] main # and MD on call # prior to instructions.  No egg or soy allergy known to patient  No issues known to pt with past sedation with any surgeries or procedures Pt denies having issues being intubated Patient denies ever being intubated Pt has no issues moving head neck or swallowing No FH of Malignant Hyperthermia Pt is not on diet pills Pt is not on home 02  Pt is not on blood thinners  Pt denies issues with constipation  Pt has frequent issues with constipation RN instructed pt to use Miralax per bottles instructions a week before prep days. Pt states they will Pt is not on dialysis Pt denise any abnormal heart rhythms  Pt denies any upcoming cardiac testing Pt encouraged to use to use Singlecare or Goodrx to reduce cost  Patient's chart reviewed by Cathlyn Parsons CNRA prior to pre-visit and patient appropriate for the [facility].  Pre-visit completed and red dot placed by patient's name on their procedure day (on provider's schedule).  . Visit by phone Pt states weight is  Instructed pt why it is important to and  to call if they have any changes in health or new medications. Directed them to the # given and on instructions.   Pt states they will.  Instructions reviewed with pt and pt states understanding. Instructed to review again prior to procedure. Pt states they will.  Instructions sent by mail with coupon and by my chart

## 2022-12-12 DIAGNOSIS — M0579 Rheumatoid arthritis with rheumatoid factor of multiple sites without organ or systems involvement: Secondary | ICD-10-CM | POA: Diagnosis not present

## 2022-12-19 ENCOUNTER — Encounter: Payer: Medicare Other | Admitting: Gastroenterology

## 2023-01-19 ENCOUNTER — Telehealth: Payer: Self-pay | Admitting: Adult Health

## 2023-01-19 DIAGNOSIS — F329 Major depressive disorder, single episode, unspecified: Secondary | ICD-10-CM

## 2023-01-19 MED ORDER — CITALOPRAM HYDROBROMIDE 20 MG PO TABS: 20.0000 mg | ORAL_TABLET | Freq: Every day | ORAL | 1 refills | Status: DC

## 2023-01-19 NOTE — Telephone Encounter (Signed)
Prescription Request  01/19/2023  LOV: 09/29/2022  What is the name of the medication or equipment? citalopram (CELEXA) 20 MG tablet   Have you contacted your pharmacy to request a refill? No   Which pharmacy would you like this sent to?  [id number] - [place], [place] - [address] AT [facility] OF [address] [place] [address] Phone: [phone] Fax: [phone]    Patient notified that their request is being sent to the clinical staff for review and that they should receive a response within 2 business days.   Please advise at Mobile [phone] (mobile)

## 2023-03-08 ENCOUNTER — Telehealth: Payer: Self-pay | Admitting: Adult Health

## 2023-03-08 NOTE — Telephone Encounter (Signed)
Patient daughter notified of update  and verbalized understanding.

## 2023-03-08 NOTE — Telephone Encounter (Signed)
Please advise 

## 2023-03-08 NOTE — Telephone Encounter (Signed)
Daughter called to inform NP that Pt is going out of the country in December, and will be gone for a few months.  Daughter is asking if NP wants Pt to come in for an OV, before she leaves?  Please advise.

## 2023-03-13 DIAGNOSIS — Z79899 Other long term (current) drug therapy: Secondary | ICD-10-CM | POA: Diagnosis not present

## 2023-03-13 DIAGNOSIS — H16221 Keratoconjunctivitis sicca, not specified as Sjogren's, right eye: Secondary | ICD-10-CM | POA: Diagnosis not present

## 2023-03-13 DIAGNOSIS — M1990 Unspecified osteoarthritis, unspecified site: Secondary | ICD-10-CM | POA: Diagnosis not present

## 2023-03-13 DIAGNOSIS — M79643 Pain in unspecified hand: Secondary | ICD-10-CM | POA: Diagnosis not present

## 2023-03-13 DIAGNOSIS — M0579 Rheumatoid arthritis with rheumatoid factor of multiple sites without organ or systems involvement: Secondary | ICD-10-CM | POA: Diagnosis not present

## 2023-04-02 DIAGNOSIS — H0102A Squamous blepharitis right eye, upper and lower eyelids: Secondary | ICD-10-CM | POA: Diagnosis not present

## 2023-04-02 DIAGNOSIS — H40013 Open angle with borderline findings, low risk, bilateral: Secondary | ICD-10-CM | POA: Diagnosis not present

## 2023-04-02 DIAGNOSIS — H2513 Age-related nuclear cataract, bilateral: Secondary | ICD-10-CM | POA: Diagnosis not present

## 2023-04-02 DIAGNOSIS — H17823 Peripheral opacity of cornea, bilateral: Secondary | ICD-10-CM | POA: Diagnosis not present

## 2023-04-02 DIAGNOSIS — H0102B Squamous blepharitis left eye, upper and lower eyelids: Secondary | ICD-10-CM | POA: Diagnosis not present

## 2023-04-27 ENCOUNTER — Ambulatory Visit
Admission: RE | Admit: 2023-04-27 | Discharge: 2023-04-27 | Disposition: A | Discharge status: Home / Self Care | Payer: Medicare Other | Source: Ambulatory Visit | Attending: Adult Health | Admitting: Adult Health

## 2023-04-27 DIAGNOSIS — M8588 Other specified disorders of bone density and structure, other site: Secondary | ICD-10-CM | POA: Diagnosis not present

## 2023-04-27 DIAGNOSIS — E2839 Other primary ovarian failure: Secondary | ICD-10-CM

## 2023-04-27 DIAGNOSIS — M069 Rheumatoid arthritis, unspecified: Secondary | ICD-10-CM | POA: Diagnosis not present

## 2023-05-03 ENCOUNTER — Encounter: Payer: Self-pay | Admitting: Adult Health

## 2023-05-03 ENCOUNTER — Ambulatory Visit: Payer: Medicare Other | Admitting: Adult Health

## 2023-05-03 VITALS — BP 120/60 | HR 53 | Temp 98.1°F | Ht 60.0 in | Wt 150.0 lb

## 2023-05-03 DIAGNOSIS — E782 Mixed hyperlipidemia: Secondary | ICD-10-CM | POA: Diagnosis not present

## 2023-05-03 DIAGNOSIS — M810 Age-related osteoporosis without current pathological fracture: Secondary | ICD-10-CM | POA: Diagnosis not present

## 2023-05-03 DIAGNOSIS — Z23 Encounter for immunization: Secondary | ICD-10-CM

## 2023-05-03 LAB — VITAMIN D 25 HYDROXY (VIT D DEFICIENCY, FRACTURES): VITD: 30.36 ng/mL (ref 30.00–100.00)

## 2023-05-03 MED ORDER — ATORVASTATIN CALCIUM 10 MG PO TABS: 10.0000 mg | ORAL_TABLET | Freq: Every day | ORAL | 2 refills | Status: DC

## 2023-05-03 MED ORDER — ALENDRONATE SODIUM 70 MG PO TABS: 70.0000 mg | ORAL_TABLET | ORAL | 3 refills | Status: DC

## 2023-05-03 NOTE — Progress Notes (Signed)
Subjective:    Patient ID: Rachel Austin, female    DOB: 1956-01-09, 67 y.o.   MRN: [id number]  HPI 67 year old female who  has a past medical history of Acute blood loss anemia (06/01/2018), Anemia, Anemia, iron deficiency (06/01/2018), Cataract (2018), Depression, Hyperlipidemia, Hypertension, Rheumatoid arthritis (HCC), and Syncope.  She is being evaluated today for follow-up regarding osteoporosis.  She had a bone density screen done earlier this month which showed a T-score of -4.2. She is here today to discuss options on medications to take to help strengthen her bones.   She is taking calcium and vitamin D   He also needs a refill of lipitor    Review of Systems See HPI   Past Medical History:  Diagnosis Date   Acute blood loss anemia 06/01/2018   Anemia    Anemia, iron deficiency 06/01/2018   Cataract 2018   small, bilateral   Depression    Hyperlipidemia    Hypertension    Rheumatoid arthritis (HCC)    on meds   Syncope     Social History   Socioeconomic History   Marital status: Single    Spouse name: Not on file   Number of children: Not on file   Years of education: Not on file   Highest education level: Not on file  Occupational History   Not on file  Tobacco Use   Smoking status: Never   Smokeless tobacco: Never  Vaping Use   Vaping status: Never Used  Substance and Sexual Activity   Alcohol use: No    Alcohol/week: 0.0 standard drinks of alcohol   Drug use: No   Sexual activity: Never  Other Topics Concern   Not on file  Social History Narrative   Moved from [place] to [place] in December   Social Drivers of Health   Financial Resource Strain: Low Risk  (05/05/2021)   Overall Financial Resource Strain (CARDIA)    Difficulty of Paying Living Expenses: Not hard at all  Food Insecurity: No Food Insecurity (03/31/2022)   Hunger Vital Sign    Worried About Running Out of Food in the Last Year: Never true    Ran Out of Food in the Last Year: Never  true  Recent Concern: Food Insecurity - Food Insecurity Present (02/09/2022)   Hunger Vital Sign    Worried About Running Out of Food in the Last Year: Sometimes true    Ran Out of Food in the Last Year: Sometimes true  Transportation Needs: No Transportation Needs (03/31/2022)   PRAPARE - Administrator, Civil Service (Medical): No    Lack of Transportation (Non-Medical): No  Physical Activity: Inactive (05/05/2021)   Exercise Vital Sign    Days of Exercise per Week: 0 days    Minutes of Exercise per Session: 0 min  Stress: No Stress Concern Present (05/05/2021)   [employer] of Occupational Health - Occupational Stress Questionnaire    Feeling of Stress : Only a little  Social Connections: Socially Isolated (05/05/2021)   Social Connection and Isolation Panel [NHANES]    Frequency of Communication with Friends and Family: More than three times a week    Frequency of Social Gatherings with Friends and Family: Never    Attends Religious Services: Never    Database administrator or Organizations: No    Attends Banker Meetings: Never    Marital Status: Never married  Intimate Partner Violence: Not At Risk (03/31/2022)   Humiliation, Afraid, Rape,  and Austin questionnaire    Fear of Current or Ex-Partner: No    Emotionally Abused: No    Physically Abused: No    Sexually Abused: No    Past Surgical History:  Procedure Laterality Date   FINGER SURGERY  01/29/2017   last 2 fingers left hand   FOOT SURGERY  2016   Bil/ foot reconstruction due to RA   KNEE SURGERY Bilateral    Right knee-2010, Left knee -2006   TOTAL HIP ARTHROPLASTY Right 05/31/2018   Procedure: TOTAL HIP ARTHROPLASTY;  Surgeon: Frederico Hamman, MD;  Location: [facility];  Service: Orthopedics;  Laterality: Right;   TOTAL HIP ARTHROPLASTY Left 03/31/2022   Procedure: TOTAL HIP ARTHROPLASTY;  Surgeon: Frederico Hamman, MD;  Location: [facility];  Service: Orthopedics;  Laterality: Left;    VARICOSE VEIN SURGERY  2015   right     Family History  Problem Relation Age of Onset   Heart disease Mother        pacemaker, hypertensin    No Known Allergies  Current Outpatient Medications on File Prior to Visit  Medication Sig Dispense Refill   atorvastatin (LIPITOR) 10 MG tablet Take 1 tablet (10 mg total) by mouth daily. 90 tablet 0   Calcium Carb-Cholecalciferol (CALCIUM + D3 PO) Take 1 tablet by mouth daily.     carboxymethylcellulose (REFRESH PLUS) 0.5 % SOLN Place 1 drop into both eyes as needed (dry eyes).     citalopram (CELEXA) 20 MG tablet Take 1 tablet (20 mg total) by mouth daily. 90 tablet 1   ferrous sulfate 325 (65 FE) MG EC tablet Take 325 mg by mouth daily.     hypromellose (SYSTANE OVERNIGHT THERAPY) 0.3 % GEL ophthalmic ointment Place 1 Application into both eyes at bedtime.     lisinopril (ZESTRIL) 10 MG tablet TAKE 1 TABLET(10 MG) BY MOUTH DAILY 90 tablet 3   Multiple Vitamins-Minerals (CENTRUM SILVER ULTRA WOMENS PO) Take 1 tablet by mouth daily.      Omega-3 Fatty Acids (FISH OIL) 1000 MG CAPS Take 1,000 mg by mouth daily.     Probiotic Product (HEALTHY COLON PO) Take 1 capsule by mouth daily.     RESTASIS 0.05 % ophthalmic emulsion Place 1 drop into both eyes 2 (two) times daily.     RINVOQ 15 MG TB24 Take 1 tablet by mouth daily.     No current facility-administered medications on file prior to visit.    BP 120/60   Pulse (!) 53   Temp 98.1 F (36.7 C) (Oral)   Ht 5' (1.524 m)   Wt 150 lb (68 kg)   SpO2 96%   BMI 29.29 kg/m       Objective:   Physical Exam Vitals and nursing note reviewed.  Constitutional:      Appearance: Normal appearance.  Cardiovascular:     Rate and Rhythm: Normal rate and regular rhythm.     Pulses: Normal pulses.     Heart sounds: Normal heart sounds.  Pulmonary:     Effort: Pulmonary effort is normal.     Breath sounds: Normal breath sounds.  Musculoskeletal:        General: Normal range of motion.  Skin:     General: Skin is dry.  Neurological:     General: No focal deficit present.     Mental Status: She is alert and oriented to person, place, and time.  Psychiatric:        Mood and Affect: Mood normal.  Behavior: Behavior normal.        Thought Content: Thought content normal.        Judgment: Judgment normal.        Assessment & Plan:  1. Age related osteoporosis, unspecified pathological fracture presence (Primary) - Cannot use Prolia due to being on Rinvoq. Will start her on Fosamax. She was advised of side effects.  - VITAMIN D 25 Hydroxy (Vit-D Deficiency, Fractures); Future - alendronate (FOSAMAX) 70 MG tablet; Take 1 tablet (70 mg total) by mouth every 7 (seven) days. Take with a full glass of water on an empty stomach.  Dispense: 12 tablet; Refill: 3  2. Mixed hyperlipidemia  - atorvastatin (LIPITOR) 10 MG tablet; Take 1 tablet (10 mg total) by mouth daily.  Dispense: 90 tablet; Refill: 2  3. Need for influenza vaccination  - Flu Vaccine Trivalent High Dose (Fluad)

## 2023-05-07 DIAGNOSIS — H17823 Peripheral opacity of cornea, bilateral: Secondary | ICD-10-CM | POA: Diagnosis not present

## 2023-05-07 DIAGNOSIS — H0102A Squamous blepharitis right eye, upper and lower eyelids: Secondary | ICD-10-CM | POA: Diagnosis not present

## 2023-05-07 DIAGNOSIS — H40013 Open angle with borderline findings, low risk, bilateral: Secondary | ICD-10-CM | POA: Diagnosis not present

## 2023-05-07 DIAGNOSIS — H2513 Age-related nuclear cataract, bilateral: Secondary | ICD-10-CM | POA: Diagnosis not present

## 2023-05-07 DIAGNOSIS — H0102B Squamous blepharitis left eye, upper and lower eyelids: Secondary | ICD-10-CM | POA: Diagnosis not present

## 2023-05-07 DIAGNOSIS — H16223 Keratoconjunctivitis sicca, not specified as Sjogren's, bilateral: Secondary | ICD-10-CM | POA: Diagnosis not present

## 2023-05-08 DIAGNOSIS — Z79899 Other long term (current) drug therapy: Secondary | ICD-10-CM | POA: Diagnosis not present

## 2023-07-03 ENCOUNTER — Other Ambulatory Visit: Payer: Self-pay | Admitting: Adult Health

## 2023-07-03 DIAGNOSIS — F329 Major depressive disorder, single episode, unspecified: Secondary | ICD-10-CM

## 2023-08-15 ENCOUNTER — Other Ambulatory Visit: Payer: Self-pay | Admitting: Adult Health

## 2023-08-15 DIAGNOSIS — I10 Essential (primary) hypertension: Secondary | ICD-10-CM

## 2023-08-15 DIAGNOSIS — E782 Mixed hyperlipidemia: Secondary | ICD-10-CM

## 2023-08-15 DIAGNOSIS — F329 Major depressive disorder, single episode, unspecified: Secondary | ICD-10-CM

## 2023-08-15 MED ORDER — LISINOPRIL 10 MG PO TABS: ORAL_TABLET | ORAL | 3 refills | Status: DC

## 2023-08-15 NOTE — Telephone Encounter (Signed)
 Patient has 2 refills (90 day supply each) for atorvastatin and 1 refill (90 day supply) of citalopram prescribed to pharmacy. Cancelled reorder request for both medications. Pend and send on lisinopril to clinic.

## 2023-08-15 NOTE — Telephone Encounter (Signed)
 Copied from CRM [id number]. Topic: Clinical - Medication Refill >> Aug 15, 2023 10:26 AM Bo Mcclintock wrote: Most Recent Primary Care Visit:  Provider: Shirline Frees  Department: [facility]  Visit Type: OFFICE VISIT  Date: 05/03/2023  Medication: citalopram (CELEXA) 20 MG tablet lisinopril (ZESTRIL) 10 MG tablet atorvastatin (LIPITOR) 10 MG tablet   Has the patient contacted their pharmacy? Yes (Agent: If no, request that the patient contact the pharmacy for the refill. If patient does not wish to contact the pharmacy document the reason why and proceed with request.) (Agent: If yes, when and what did the pharmacy advise?) Contact PCP  Is this the correct pharmacy for this prescription? Yes If no, delete pharmacy and type the correct one.  This is the patient's preferred pharmacy:  [id number] - [place], [place] - [address] AT [facility] [address] [place] [address] Phone: [phone] Fax: [phone]   Has the prescription been filled recently? No  Is the patient out of the medication? Yes  Has the patient been seen for an appointment in the last year OR does the patient have an upcoming appointment? No  Can we respond through MyChart? No  Agent: Please be advised that Rx refills may take up to 3 business days. We ask that you follow-up with your pharmacy.

## 2023-08-24 ENCOUNTER — Ambulatory Visit: Admitting: Family Medicine

## 2023-09-11 ENCOUNTER — Emergency Department (HOSPITAL_BASED_OUTPATIENT_CLINIC_OR_DEPARTMENT_OTHER): Admitting: Radiology

## 2023-09-11 ENCOUNTER — Ambulatory Visit: Payer: Self-pay | Admitting: Adult Health

## 2023-09-11 ENCOUNTER — Telehealth: Payer: Self-pay | Admitting: Adult Health

## 2023-09-11 ENCOUNTER — Emergency Department (HOSPITAL_BASED_OUTPATIENT_CLINIC_OR_DEPARTMENT_OTHER)
Admission: EM | Admit: 2023-09-11 | Discharge: 2023-09-11 | Disposition: A | Discharge status: Home / Self Care | Attending: Emergency Medicine | Admitting: Emergency Medicine

## 2023-09-11 DIAGNOSIS — J40 Bronchitis, not specified as acute or chronic: Secondary | ICD-10-CM | POA: Diagnosis not present

## 2023-09-11 DIAGNOSIS — R0602 Shortness of breath: Secondary | ICD-10-CM | POA: Diagnosis present

## 2023-09-11 DIAGNOSIS — R059 Cough, unspecified: Secondary | ICD-10-CM | POA: Diagnosis present

## 2023-09-11 LAB — BASIC METABOLIC PANEL WITH GFR
Anion gap: 11 (ref 5–15)
BUN: 13 mg/dL (ref 8–23)
CO2: 21 mmol/L — ABNORMAL LOW (ref 22–32)
Calcium: 10.1 mg/dL (ref 8.9–10.3)
Chloride: 107 mmol/L (ref 98–111)
Creatinine, Ser: 0.47 mg/dL (ref 0.44–1.00)
GFR, Estimated: >60 mL/min (ref >60)
Glucose, Bld: 94 mg/dL (ref 70–99)
Potassium: 4.4 mmol/L (ref 3.5–5.1)
Sodium: 140 mmol/L (ref 135–145)

## 2023-09-11 LAB — CBC WITH DIFFERENTIAL/PLATELET
Abs Immature Granulocytes: 0.01 10*3/uL (ref 0.00–0.07)
Basophils Absolute: 0 10*3/uL (ref 0.0–0.1)
Basophils Relative: 0 %
Eosinophils Absolute: 0.2 10*3/uL (ref 0.0–0.5)
Eosinophils Relative: 3 %
HCT: 35.5 % — ABNORMAL LOW (ref 36.0–46.0)
Hemoglobin: 11.7 g/dL — ABNORMAL LOW (ref 12.0–15.0)
Immature Granulocytes: 0 %
Lymphocytes Relative: 55 %
Lymphs Abs: 3.2 10*3/uL (ref 0.7–4.0)
MCH: 29.8 pg (ref 26.0–34.0)
MCHC: 33 g/dL (ref 30.0–36.0)
MCV: 90.6 fL (ref 80.0–100.0)
Monocytes Absolute: 0.6 10*3/uL (ref 0.1–1.0)
Monocytes Relative: 10 %
Neutro Abs: 1.9 10*3/uL (ref 1.7–7.7)
Neutrophils Relative %: 32 %
Platelets: 252 10*3/uL (ref 150–400)
RBC: 3.92 MIL/uL (ref 3.87–5.11)
RDW: 13.2 % (ref 11.5–15.5)
WBC: 5.9 10*3/uL (ref 4.0–10.5)
nRBC: 0 % (ref 0.0–0.2)

## 2023-09-11 LAB — RESP PANEL BY RT-PCR (RSV, FLU A&B, COVID)  RVPGX2
Influenza A by PCR: NEGATIVE
Influenza B by PCR: NEGATIVE
Resp Syncytial Virus by PCR: NEGATIVE
SARS Coronavirus 2 by RT PCR: NEGATIVE

## 2023-09-11 LAB — TROPONIN T, HIGH SENSITIVITY: Troponin T High Sensitivity: <15 ng/L (ref <19)

## 2023-09-11 MED ORDER — AZITHROMYCIN 250 MG PO TABS
500.0000 mg | ORAL_TABLET | Freq: Once | ORAL | Status: AC
  Administered 2023-09-11: 500 mg via ORAL
  Filled 2023-09-11: qty 2

## 2023-09-11 MED ORDER — AZITHROMYCIN 250 MG PO TABS: ORAL_TABLET | ORAL | 0 refills | Status: AC

## 2023-09-11 NOTE — ED Provider Notes (Signed)
 [place] EMERGENC[facility] DEPARTMENT AT [facility] Provider Note   CSN: [id number] Arrival date & time: 09/11/23  1320     History  Chief Complaint  Patient presents with   Cough    Rachel Austin is a 68 y.o. female.  Patient to ED with cough for the past 2 weeks, hot/cold chills. She states her upper back hurts with cough, both right and left sides. She has been coughing up brown and green phlegm. No sick contacts she is aware of.   The history is provided by the patient and a relative. A language interpreter was used (Daughter is interpreting.).  Cough      Home Medications Prior to Admission medications   Medication Sig Start Date End Date Taking? Authorizing Provider  azithromycin  (ZITHROMAX ) 250 MG tablet Take 1 every day until finished starting tomorrow 09/12/23  Yes Maddock Finigan, Clovis Dar, PA-C  atorvastatin  (LIPITOR) 10 MG tablet Take 1 tablet (10 mg total) by mouth daily. 05/03/23   Nafziger, Randel Buss, NP  Calcium  Carb-Cholecalciferol (CALCIUM  + D3 PO) Take 1 tablet by mouth daily.    [provider]  carboxymethylcellulose (REFRESH PLUS) 0.5 % SOLN Place 1 drop into both eyes as needed (dry eyes).    [provider]  citalopram  (CELEXA ) 20 MG tablet TAKE 1 TABLET(20 MG) BY MOUTH DAILY 07/04/23   Nafziger, Randel Buss, NP  ferrous sulfate  325 (65 FE) MG EC tablet Take 325 mg by mouth daily.    [provider]  hypromellose (SYSTANE OVERNIGHT THERAPY) 0.3 % GEL ophthalmic ointment Place 1 Application into both eyes at bedtime.    [provider]  lisinopril  (ZESTRIL ) 10 MG tablet TAKE 1 TABLET(10 MG) BY MOUTH DAILY 08/15/23   Nafziger, Randel Buss, NP  Multiple Vitamins-Minerals (CENTRUM SILVER ULTRA WOMENS PO) Take 1 tablet by mouth daily.     [provider]  Omega-3 Fatty Acids (FISH OIL) 1000 MG CAPS Take 1,000 mg by mouth daily.    [provider]  Probiotic Product (HEALTHY COLON PO) Take 1 capsule by mouth daily.    [provider]  RESTASIS  0.05 % ophthalmic emulsion Place 1 drop into both eyes 2 (two) times daily. 08/24/20   [provider]  RINVOQ 15 MG TB24 Take 1 tablet by mouth daily.    [provider]      Allergies    Patient has no known allergies.    Review of Systems   Review of Systems  Respiratory:  Positive for cough.     Physical Exam Updated Vital Signs BP 131/72 (BP Location: Right Arm)   Pulse 78   Temp 98 F (36.7 C)   Resp 18   SpO2 99%  Physical Exam Vitals and nursing note reviewed.  Constitutional:      Appearance: She is well-developed.  HENT:     Head: Normocephalic.     Mouth/Throat:     Mouth: Mucous membranes are moist.  Cardiovascular:     Rate and Rhythm: Normal rate and regular rhythm.     Heart sounds: No murmur heard. Pulmonary:     Effort: Pulmonary effort is normal.     Breath sounds: Normal breath sounds. No wheezing, rhonchi or rales.     Comments: Actively coughing. Abdominal:     Palpations: Abdomen is soft.     Tenderness: There is no abdominal tenderness. There is no guarding or rebound.  Musculoskeletal:        General: Normal range of motion.  Cervical back: Normal range of motion and neck supple.  Skin:    General: Skin is warm and dry.  Neurological:     General: No focal deficit present.     Mental Status: She is alert and oriented to person, place, and time.     ED Results / Procedures / Treatments   Labs (all labs ordered are listed, but only abnormal results are displayed) Labs Reviewed  BASIC METABOLIC PANEL WITH GFR - Abnormal; Notable for the following components:      Result Value   CO2 21 (*)    All other components within normal limits  CBC WITH DIFFERENTIAL/PLATELET - Abnormal; Notable for the following components:   Hemoglobin 11.7 (*)    HCT 35.5 (*)    All other components within normal limits  RESP PANEL BY RT-PCR (RSV, FLU A&B, COVID)  [id number]  TROPONIN T, HIGH SENSITIVITY    EKG EKG  Interpretation Date/Time:  Tuesday September 11 2023 14:17:42 EDT Ventricular Rate:  82 PR Interval:  134 QRS Duration:  76 QT Interval:  356 QTC Calculation: 415 R Axis:   82  Text Interpretation: Normal sinus rhythm Nonspecific ST and T wave abnormality Abnormal ECG No previous ECGs available Abnormal ECG Confirmed by [place] Horse [id number]) on 09/11/2023 7:47:08 PM  Radiology DG Chest 2 View Result Date: 09/11/2023 CLINICAL DATA:  Cough and shortness of breath EXAM: CHEST - 2 VIEW COMPARISON:  Chest x-ray 05/01/2018. FINDINGS: No consolidation, pneumothorax or effusion. No edema. Normal cardiopericardial silhouette. Degenerative changes and curvature of the spine. Artifact from the patient's clothing. Once again there are some lucent areas along the shoulders. Please correlate for a provided history of rheumatoid arthritis. IMPRESSION: No acute cardiopulmonary disease. Electronically Signed   By: Adrianna Horde M.D.   On: 09/11/2023 15:06    Procedures Procedures    Medications Ordered in ED Medications  azithromycin  (ZITHROMAX ) tablet 500 mg (has no administration in time range)    ED Course/ Medical Decision Making/ A&P Clinical Course as of 09/11/23 1953  Tue Sep 11, 2023  1945 Patient to ED with daughter with ss/sxs as per HPI. She is well appearing. VSS. Viral panel negative. CXR without focal infiltrates or consolidations. Agree with radiology interpretation. No leukocytosis. EKG slightly abnormal, troponin drawn and is negative. Discussed with dr. Florie Husband. She is appropriate for discharge home. Will start abx given duration of symptoms.  [SU]    Clinical Course User Index [SU] Mandy Second, PA-C                                 Medical Decision Making Amount and/or Complexity of Data Reviewed Labs: ordered. Radiology: ordered.           Final Clinical Impression(s) / ED Diagnoses Final diagnoses:  Bronchitis    Rx / DC Orders ED Discharge Orders           Ordered    azithromycin  (ZITHROMAX ) 250 MG tablet        09/11/23 1950              Mandy Second, PA-C 09/11/23 1953    [place] Horse T, DO 09/15/23 1551

## 2023-09-11 NOTE — ED Triage Notes (Signed)
 Cough x 2 weeks, fever Some chest and back pain Family to interpret per patient request Brown/ green phlegm Recent travel to [place] Dec to march 26th

## 2023-09-11 NOTE — Telephone Encounter (Signed)
 Spoke to pt and daughter and [facility].

## 2023-09-11 NOTE — Telephone Encounter (Signed)
 E2 C2   nurse   call and stated pt refuse to go to ER.

## 2023-09-11 NOTE — Discharge Instructions (Signed)
 Su radiografa de trax, perfil viral y [place] son normales. Es probable que tenga una infeccin viral de las vas respiratorias superiores que no sea gripe ni COVID-19. Sin embargo, Scientist, research (physical sciences) un antibitico debido a la larga duracin de los sntomas. Le administraron su primera dosis esta noche y tomar una dosis diaria durante los prximos 4 das. Contine con Tylenol  o ibuprofeno para el dolor y si tiene [place].  Your chest xray, viral panel and blood tests are normal. You likely have a viral upper respiratory infection that is not flu or COVID. However, we are prescribing an antibiotic because of the long duration of symptoms. You were given your first dose tonight and will take one dose daily for the next 4 days. Continue tylenol  or ibuprofen for aches and if any fever.

## 2023-09-11 NOTE — Telephone Encounter (Signed)
 Productive cough x2 weeks  Symptoms: Chest pain constant (5-6/10), back and lung pain constant, phlegm has been brown, intermittent difficulty breathing  Pertinent Negatives: Patient denies hemoptysis, fever  Disposition: [x] ED [x] Refused Recommended Disposition  Additional Notes: Spoke with pt's daughter, Moira Andrews. This RN advised pt to go to ED but pt refused. This RN notified [facility] of pt refusal of ED disposition. Pt daughter, Moira Andrews, requests a call back from clinical staff at [phone].   Copied from CRM [id number]. Topic: Clinical - Red Word Triage >> Sep 11, 2023 10:23 AM Everlene Hobby D wrote: Patient has been having worsening problems of a cough, chest pain and it has been going on 2 weeks Reason for Disposition  [1] Chest pain lasts > 5 minutes AND [2] age > 37  Answer Assessment - Initial Assessment Questions Productive cough x2 weeks  Symptoms: Chest pain constant (5-6/10), back and lung pain constant, phlegm  has been brown, intermittent difficulty breathing   Pertinent Negatives: Patient denies hemoptysis, fever  Protocols used: Chest Pain-A-AH

## 2023-09-12 ENCOUNTER — Telehealth: Payer: Self-pay

## 2023-09-12 NOTE — Transitions of Care (Post Inpatient/ED Visit) (Unsigned)
   09/12/2023  Name: Rachel Austin MRN: [id number] DOB: 09/11/1955  Today's TOC FU Call Status: Today's TOC FU Call Status:: Unsuccessful Call (1st Attempt) Unsuccessful Call (1st Attempt) Date: 09/12/23  Attempted to reach the patient regarding the most recent Inpatient/ED visit.  Follow Up Plan: Additional outreach attempts will be made to reach the patient to complete the Transitions of Care (Post Inpatient/ED visit) call.   Signature Darrall Ellison, LPN [facility] Nurse Health Advisor Direct Dial [phone]

## 2023-09-13 NOTE — Transitions of Care (Post Inpatient/ED Visit) (Unsigned)
   09/13/2023  Name: Rachel Austin MRN: [id number] DOB: 1955/06/23  Today's TOC FU Call Status: Today's TOC FU Call Status:: Unsuccessful Call (2nd Attempt) Unsuccessful Call (1st Attempt) Date: 09/12/23 Unsuccessful Call (2nd Attempt) Date: 09/13/23  Attempted to reach the patient regarding the most recent Inpatient/ED visit.  Follow Up Plan: Additional outreach attempts will be made to reach the patient to complete the Transitions of Care (Post Inpatient/ED visit) call.   Signature Darrall Ellison, LPN [facility] Nurse Health Advisor Direct Dial [phone]

## 2023-09-14 ENCOUNTER — Ambulatory Visit (INDEPENDENT_AMBULATORY_CARE_PROVIDER_SITE_OTHER): Admitting: Adult Health

## 2023-09-14 VITALS — BP 120/60 | HR 71 | Temp 98.8°F | Ht 60.0 in | Wt 155.0 lb

## 2023-09-14 DIAGNOSIS — J40 Bronchitis, not specified as acute or chronic: Secondary | ICD-10-CM

## 2023-09-14 MED ORDER — PREDNISONE 10 MG PO TABS: ORAL_TABLET | ORAL | 0 refills | Status: AC

## 2023-09-14 MED ORDER — BENZONATATE 200 MG PO CAPS: 200.0000 mg | ORAL_CAPSULE | Freq: Three times a day (TID) | ORAL | 0 refills | Status: AC | PRN

## 2023-09-14 NOTE — Progress Notes (Signed)
 Subjective:    Patient ID: Rachel Austin, female    DOB: [birth year], 68 y.o.   MRN: [id number]  HPI 68 year old female who  has a past medical history of Acute blood loss anemia (06/01/2018), Anemia, Anemia, iron deficiency (06/01/2018), Cataract (2018), Depression, Hyperlipidemia, Hypertension, Rheumatoid arthritis (HCC), and Syncope.  She presents to the office today for follow-up after being seen in the emergency room 2 days ago for cough x 2 weeks, hot/cold chills and upper back pain from coughing.  In the ER she reported coughing up brown and green phlegm.  Her viral panel was negative.  Chest x-ray without focal infiltrates or consolidations.  She did not have any leukocytosis.  EKG slightly abnormal and troponin was drawn which was negative.  She was prescribed a course of azithromycin .   Today she reports that she continues to have a constant cough, semi productive in nature with clear sputum. She feels short of breath, especially with exertion and has chest congestion. The antibiotics have not made much of a difference. She denies hot or cold chills.     Review of Systems See HPI   Past Medical History:  Diagnosis Date   Acute blood loss anemia 06/01/2018   Anemia    Anemia, iron deficiency 06/01/2018   Cataract 2018   small, bilateral   Depression    Hyperlipidemia    Hypertension    Rheumatoid arthritis (HCC)    on meds   Syncope     Social History   Socioeconomic History   Marital status: Single    Spouse name: Not on file   Number of children: Not on file   Years of education: Not on file   Highest education level: Not on file  Occupational History   Not on file  Tobacco Use   Smoking status: Never   Smokeless tobacco: Never  Vaping Use   Vaping status: Never Used  Substance and Sexual Activity   Alcohol use: No    Alcohol/week: 0.0 standard drinks of alcohol   Drug use: No   Sexual activity: Never  Other Topics Concern   Not on file  Social  History Narrative   Moved from [place] to [place] in December   Social Drivers of Health   Financial Resource Strain: Low Risk  (05/05/2021)   Overall Financial Resource Strain (CARDIA)    Difficulty of Paying Living Expenses: Not hard at all  Food Insecurity: No Food Insecurity (03/31/2022)   Hunger Vital Sign    Worried About Running Out of Food in the Last Year: Never true    Ran Out of Food in the Last Year: Never true  Recent Concern: Food Insecurity - Food Insecurity Present (02/09/2022)   Hunger Vital Sign    Worried About Running Out of Food in the Last Year: Sometimes true    Ran Out of Food in the Last Year: Sometimes true  Transportation Needs: No Transportation Needs (03/31/2022)   PRAPARE - Administrator, Civil Service (Medical): No    Lack of Transportation (Non-Medical): No  Physical Activity: Inactive (05/05/2021)   Exercise Vital Sign    Days of Exercise per Week: 0 days    Minutes of Exercise per Session: 0 min  Stress: No Stress Concern Present (05/05/2021)   [employer] of Occupational Health - Occupational Stress Questionnaire    Feeling of Stress : Only a little  Social Connections: Socially Isolated (05/05/2021)   Social Connection and Isolation Panel [NHANES]  Frequency of Communication with Friends and Family: More than three times a week    Frequency of Social Gatherings with Friends and Family: Never    Attends Religious Services: Never    Database administrator or Organizations: No    Attends Banker Meetings: Never    Marital Status: Never married  Intimate Partner Violence: Not At Risk (03/31/2022)   Humiliation, Afraid, Rape, and Kick questionnaire    Fear of Current or Ex-Partner: No    Emotionally Abused: No    Physically Abused: No    Sexually Abused: No    Past Surgical History:  Procedure Laterality Date   FINGER SURGERY  01/29/2017   last 2 fingers left hand   FOOT SURGERY  2016   Bil/ foot reconstruction due  to RA   KNEE SURGERY Bilateral    Right knee-2010, Left knee -2006   TOTAL HIP ARTHROPLASTY Right 05/31/2018   Procedure: TOTAL HIP ARTHROPLASTY;  Surgeon: Marlena Sima, MD;  Location: [facility];  Service: Orthopedics;  Laterality: Right;   TOTAL HIP ARTHROPLASTY Left 03/31/2022   Procedure: TOTAL HIP ARTHROPLASTY;  Surgeon: Marlena Sima, MD;  Location: [facility];  Service: Orthopedics;  Laterality: Left;   VARICOSE VEIN SURGERY  2015   right     Family History  Problem Relation Age of Onset   Heart disease Mother        pacemaker, hypertensin    No Known Allergies  Current Outpatient Medications on File Prior to Visit  Medication Sig Dispense Refill   atorvastatin  (LIPITOR) 10 MG tablet Take 1 tablet (10 mg total) by mouth daily. 90 tablet 2   azithromycin  (ZITHROMAX ) 250 MG tablet Take 1 every day until finished starting tomorrow 4 tablet 0   Calcium  Carb-Cholecalciferol (CALCIUM  + D3 PO) Take 1 tablet by mouth daily.     carboxymethylcellulose (REFRESH PLUS) 0.5 % SOLN Place 1 drop into both eyes as needed (dry eyes).     citalopram  (CELEXA ) 20 MG tablet TAKE 1 TABLET(20 MG) BY MOUTH DAILY 90 tablet 1   ferrous sulfate  325 (65 FE) MG EC tablet Take 325 mg by mouth daily.     hypromellose (SYSTANE OVERNIGHT THERAPY) 0.3 % GEL ophthalmic ointment Place 1 Application into both eyes at bedtime.     lisinopril  (ZESTRIL ) 10 MG tablet TAKE 1 TABLET(10 MG) BY MOUTH DAILY 90 tablet 3   Multiple Vitamins-Minerals (CENTRUM SILVER ULTRA WOMENS PO) Take 1 tablet by mouth daily.      Omega-3 Fatty Acids (FISH OIL) 1000 MG CAPS Take 1,000 mg by mouth daily.     Probiotic Product (HEALTHY COLON PO) Take 1 capsule by mouth daily.     RESTASIS  0.05 % ophthalmic emulsion Place 1 drop into both eyes 2 (two) times daily.     RINVOQ 15 MG TB24 Take 1 tablet by mouth daily.     No current facility-administered medications on file prior to visit.    BP 120/60   Pulse 71   Temp 98.8 F (37.1 C)  (Oral)   Ht 5' (1.524 m)   Wt 155 lb (70.3 kg)   SpO2 96%   BMI 30.27 kg/m       Objective:   Physical Exam Vitals and nursing note reviewed.  Constitutional:      Appearance: Normal appearance.  Cardiovascular:     Rate and Rhythm: Normal rate and regular rhythm.     Pulses: Normal pulses.     Heart sounds: Normal heart  sounds.  Pulmonary:     Effort: Pulmonary effort is normal.     Breath sounds: Wheezing (expiratory wheezing throughout) present.  Musculoskeletal:        General: Normal range of motion.  Skin:    General: Skin is warm and dry.  Neurological:     General: No focal deficit present.     Mental Status: She is alert and oriented to person, place, and time.  Psychiatric:        Mood and Affect: Mood normal.        Behavior: Behavior normal.        Thought Content: Thought content normal.        Judgment: Judgment normal.        Assessment & Plan:  1. Bronchitis (Primary) - She can finish her antibiotics but will have her start prednisone.  - Follow up if not resolved by the end of the antibiotics  - predniSONE (DELTASONE) 10 MG tablet; 40 mg x 3 days, 20 mg x 3 days, 10 mg x 3 days  Dispense: 21 tablet; Refill: 0 - benzonatate (TESSALON) 200 MG capsule; Take 1 capsule (200 mg total) by mouth 3 (three) times daily as needed.  Dispense: 30 capsule; Refill: 0  Alto Atta, NP  Time spent with patient today was 32 minutes which consisted of chart review, discussing bronchitis, work up, treatment answering questions and documentation.

## 2023-09-14 NOTE — Transitions of Care (Post Inpatient/ED Visit) (Signed)
 09/14/2023  Name: Rachel Austin MRN: [id number] DOB: [birth year]  Today's TOC FU Call Status: Today's TOC FU Call Status:: Successful TOC FU Call Completed Unsuccessful Call (1st Attempt) Date: 09/12/23 Unsuccessful Call (2nd Attempt) Date: 09/13/23 [facility] FU Call Complete Date: 09/14/23 Patient's Name and Date of Birth confirmed.  Transition Care Management Follow-up Telephone Call Date of Discharge: 09/11/23 Discharge Facility: [facility] ([facility]) Type of Discharge: Emergency Department Reason for ED Visit: Other: (bronchitis) How have you been since you were released from the hospital?: Better Any questions or concerns?: No  Items Reviewed: Did you receive and understand the discharge instructions provided?: Yes Medications obtained,verified, and reconciled?: Yes (Medications Reviewed) Any new allergies since your discharge?: No Dietary orders reviewed?: Yes Do you have support at home?: Yes People in Home [RPT]: child(ren), adult  Medications Reviewed Today: Medications Reviewed Today     Reviewed by Darrall Ellison, LPN (Licensed Practical Nurse) on 09/14/23 at 1116  Med List Status: <None>   Medication Order Taking? Sig Documenting Provider Last Dose Status Informant  atorvastatin  (LIPITOR) 10 MG tablet [id number]  Take 1 tablet (10 mg total) by mouth daily. Nafziger, Randel Buss, NP  Active   azithromycin  (ZITHROMAX ) 250 MG tablet [id number]  Take 1 every day until finished starting tomorrow Mandy Second, PA-C  Active   Calcium  Carb-Cholecalciferol (CALCIUM  + D3 PO) [id number] No Take 1 tablet by mouth daily. [provider] Taking Active Self, Family Member  carboxymethylcellulose (REFRESH PLUS) 0.5 % SOLN [id number] No Place 1 drop into both eyes as needed (dry eyes). [provider] Taking Active Self, Family Member  citalopram  (CELEXA ) 20 MG tablet [id number]  TAKE 1 TABLET(20 MG) BY MOUTH DAILY Nafziger, Randel Buss, NP  Active   ferrous sulfate  325 (65 FE)  MG EC tablet [id number] No Take 325 mg by mouth daily. [provider] Taking Active Self, Family Member  hypromellose (SYSTANE OVERNIGHT THERAPY) 0.3 % GEL ophthalmic ointment [id number] No Place 1 Application into both eyes at bedtime. [provider] Taking Active Self, Family Member  lisinopril  (ZESTRIL ) 10 MG tablet [id number]  TAKE 1 TABLET(10 MG) BY MOUTH DAILY Nafziger, Randel Buss, NP  Active   Multiple Vitamins-Minerals (CENTRUM SILVER ULTRA WOMENS PO) [id number] No Take 1 tablet by mouth daily.  [provider] Taking Active Self, Family Member  Omega-3 Fatty Acids (FISH OIL) 1000 MG CAPS [id number] No Take 1,000 mg by mouth daily. [provider] Taking Active Self, Family Member  Probiotic Product (HEALTHY COLON PO) [id number] No Take 1 capsule by mouth daily. [provider] Taking Active Self, Family Member  RESTASIS  0.05 % ophthalmic emulsion [id number] No Place 1 drop into both eyes 2 (two) times daily. [provider] Taking Active Self, Family Member  RINVOQ 15 MG [phone] [id number] No Take 1 tablet by mouth daily. [provider] Taking Active             Home Care and Equipment/Supplies: Were Home Health Services Ordered?: NA Any new equipment or medical supplies ordered?: NA  Functional Questionnaire: Do you need assistance with bathing/showering or dressing?: No Do you need assistance with meal preparation?: Yes Do you need assistance with eating?: No Do you have difficulty maintaining continence: No Do you need assistance with getting out of bed/getting out of a chair/moving?: No Do you have difficulty managing or taking your medications?: Yes  Follow up appointments reviewed: PCP Follow-up appointment confirmed?: Yes Date of PCP follow-up appointment?: 09/14/23 Follow-up Provider: [facility] Follow-up appointment confirmed?: NA Do you  need transportation to your follow-up appointment?: No Do you  understand care options if your condition(s) worsen?: Yes-patient verbalized understanding    SIGNATURE Darrall Ellison, LPN [facility] Nurse Health Advisor Direct Dial [phone]

## 2023-11-29 ENCOUNTER — Other Ambulatory Visit: Payer: Self-pay | Admitting: Adult Health

## 2023-11-29 DIAGNOSIS — I10 Essential (primary) hypertension: Secondary | ICD-10-CM

## 2023-12-03 ENCOUNTER — Other Ambulatory Visit: Payer: Self-pay | Admitting: Adult Health

## 2023-12-03 DIAGNOSIS — E782 Mixed hyperlipidemia: Secondary | ICD-10-CM

## 2023-12-13 ENCOUNTER — Other Ambulatory Visit: Payer: Self-pay | Admitting: Adult Health

## 2023-12-13 DIAGNOSIS — Z0000 Encounter for general adult medical examination without abnormal findings: Secondary | ICD-10-CM

## 2023-12-14 ENCOUNTER — Ambulatory Visit

## 2023-12-21 ENCOUNTER — Ambulatory Visit
Admission: RE | Admit: 2023-12-21 | Discharge: 2023-12-21 | Disposition: A | Discharge status: Home / Self Care | Source: Ambulatory Visit | Attending: Adult Health | Admitting: Adult Health

## 2023-12-21 DIAGNOSIS — Z0000 Encounter for general adult medical examination without abnormal findings: Secondary | ICD-10-CM

## 2024-06-12 ENCOUNTER — Other Ambulatory Visit: Payer: Self-pay | Admitting: Adult Health

## 2024-06-12 DIAGNOSIS — M810 Age-related osteoporosis without current pathological fracture: Secondary | ICD-10-CM

## 2024-06-13 ENCOUNTER — Encounter: Admitting: Adult Health

## 2024-06-13 NOTE — Progress Notes (Unsigned)
" ° °  Subjective:    Patient ID: Rachel Austin, female    DOB: 04/28/1956, 69 y.o.   MRN: [id number]  HPI  Patient presents for yearly preventative medicine examination. She is a pleasant 69 year old female who  has a past medical history of Acute blood loss anemia (06/01/2018), Anemia, Anemia, iron deficiency (06/01/2018), Cataract (2018), Depression, Hyperlipidemia, Hypertension, Rheumatoid arthritis (HCC), and Syncope.  Essential Hypertension-takes lisinopril  10 mg daily.   She denies episodes of dizziness, lightheadedness, chest pain, or shortness of breath BP Readings from Last 3 Encounters:  09/14/23 120/60  09/11/23 131/72  05/03/23 120/60    Depression -is been well controlled on Celexa  20 mg in the past.  She continues to feel as though her depression symptoms are well controlled on this dose.  Denies suicidal ideation  RA -is followed by rheumatology, currently on Rinvoq . Since going on Rinvoq she has had significant improvement in her symptoms.   Iron Deficiency Anemia - takes OTC iron supplement  Lab Results  Component Value Date   IRON 87 09/29/2022   TIBC 362.6 09/29/2022   FERRITIN 59.1 09/29/2022   Hyperlipidemia - managed with lipitor 10 mg daily.  Lab Results  Component Value Date   CHOL 319 (H) 09/29/2022   HDL 67.80 09/29/2022   LDLCALC 229 (H) 09/29/2022   TRIG 111.0 09/29/2022   CHOLHDL 5 09/29/2022   Osteoporosis - she is currently managed with Fosamax . Since she is on Rinvoq she cannot use Prolia. Her last bone density was in 04/2023 which showed a t score of -4.2 in the left forearm radius.   All immunizations and health maintenance protocols were reviewed with the patient and needed orders were placed.  Appropriate screening laboratory values were ordered for the patient including screening of hyperlipidemia, renal function and hepatic function.   Medication reconciliation,  past medical history, social history, problem list and allergies were  reviewed in detail with the patient  Goals were established with regard to weight loss, exercise, and  diet in compliance with medications    Review of Systems     Objective:   Physical Exam        Assessment & Plan:    "

## 2024-06-26 ENCOUNTER — Other Ambulatory Visit: Payer: Self-pay | Admitting: Adult Health

## 2024-06-26 DIAGNOSIS — F329 Major depressive disorder, single episode, unspecified: Secondary | ICD-10-CM
